# Patient Record
Sex: Male | Born: 1955
Health system: Southern US, Community
[De-identification: ages and names within clinical notes are randomized; demographics above are authoritative.]

## PROBLEM LIST (undated history)

## (undated) DIAGNOSIS — I00 Rheumatic fever without heart involvement: Secondary | ICD-10-CM

## (undated) DIAGNOSIS — I499 Cardiac arrhythmia, unspecified: Secondary | ICD-10-CM

## (undated) DIAGNOSIS — K635 Polyp of colon: Secondary | ICD-10-CM

## (undated) DIAGNOSIS — S83209A Unspecified tear of unspecified meniscus, current injury, unspecified knee, initial encounter: Secondary | ICD-10-CM

## (undated) DIAGNOSIS — R112 Nausea with vomiting, unspecified: Secondary | ICD-10-CM

## (undated) DIAGNOSIS — E119 Type 2 diabetes mellitus without complications: Secondary | ICD-10-CM

## (undated) DIAGNOSIS — K219 Gastro-esophageal reflux disease without esophagitis: Secondary | ICD-10-CM

## (undated) DIAGNOSIS — J189 Pneumonia, unspecified organism: Secondary | ICD-10-CM

## (undated) DIAGNOSIS — N200 Calculus of kidney: Secondary | ICD-10-CM

## (undated) DIAGNOSIS — Z87442 Personal history of urinary calculi: Secondary | ICD-10-CM

## (undated) DIAGNOSIS — Z9889 Other specified postprocedural states: Secondary | ICD-10-CM

## (undated) DIAGNOSIS — J939 Pneumothorax, unspecified: Secondary | ICD-10-CM

## (undated) DIAGNOSIS — M1990 Unspecified osteoarthritis, unspecified site: Secondary | ICD-10-CM

## (undated) DIAGNOSIS — R011 Cardiac murmur, unspecified: Secondary | ICD-10-CM

## (undated) HISTORY — PX: COLONOSCOPY WITH ESOPHAGOGASTRODUODENOSCOPY (EGD): SHX5779

## (undated) HISTORY — PX: LITHOTRIPSY: SUR834

## (undated) HISTORY — DX: Calculus of kidney: N20.0

## (undated) HISTORY — DX: Unspecified tear of unspecified meniscus, current injury, unspecified knee, initial encounter: S83.209A

## (undated) HISTORY — PX: COLONOSCOPY W/ POLYPECTOMY: SHX1380

## (undated) HISTORY — DX: Type 2 diabetes mellitus without complications: E11.9

## (undated) HISTORY — DX: Pneumothorax, unspecified: J93.9

## (undated) HISTORY — PX: APPENDECTOMY: SHX54

## (undated) HISTORY — DX: Polyp of colon: K63.5

---

## 2006-02-06 ENCOUNTER — Ambulatory Visit: Payer: Self-pay | Admitting: Unknown Physician Specialty

## 2011-12-19 ENCOUNTER — Ambulatory Visit: Payer: Self-pay | Admitting: Unknown Physician Specialty

## 2012-05-27 ENCOUNTER — Ambulatory Visit: Payer: Self-pay | Admitting: Family Medicine

## 2012-09-09 LAB — CK TOTAL AND CKMB (NOT AT ARMC)
CK, Total: 95 U/L (ref 35–232)
CK-MB: 1.3 ng/mL (ref 0.5–3.6)

## 2012-09-09 LAB — BASIC METABOLIC PANEL
Anion Gap: 7 (ref 7–16)
BUN: 25 mg/dL — ABNORMAL HIGH (ref 7–18)
Calcium, Total: 8.9 mg/dL (ref 8.5–10.1)
Chloride: 104 mmol/L (ref 98–107)
Creatinine: 0.92 mg/dL (ref 0.60–1.30)
Osmolality: 279 (ref 275–301)
Potassium: 3.7 mmol/L (ref 3.5–5.1)

## 2012-09-09 LAB — TROPONIN I: Troponin-I: <0.02 ng/mL

## 2012-09-09 LAB — CBC
HCT: 47.1 % (ref 40.0–52.0)
MCH: 30 pg (ref 26.0–34.0)
MCHC: 33.4 g/dL (ref 32.0–36.0)
Platelet: 179 10*3/uL (ref 150–440)
RDW: 13.6 % (ref 11.5–14.5)
WBC: 9.5 10*3/uL (ref 3.8–10.6)

## 2012-09-09 LAB — HEPATIC FUNCTION PANEL A (ARMC)
Albumin: 4.3 g/dL (ref 3.4–5.0)
SGOT(AST): 23 U/L (ref 15–37)
SGPT (ALT): 26 U/L (ref 12–78)
Total Protein: 7.6 g/dL (ref 6.4–8.2)

## 2012-09-09 LAB — LIPASE, BLOOD: Lipase: 99 U/L (ref 73–393)

## 2012-09-10 ENCOUNTER — Observation Stay: Payer: Self-pay | Admitting: Student

## 2012-09-10 LAB — CBC WITH DIFFERENTIAL/PLATELET
Basophil #: 0 10*3/uL (ref 0.0–0.1)
Eosinophil #: 0.2 10*3/uL (ref 0.0–0.7)
Eosinophil %: 3.4 %
HCT: 38.2 % — ABNORMAL LOW (ref 40.0–52.0)
Lymphocyte #: 1.1 10*3/uL (ref 1.0–3.6)
Lymphocyte %: 20.9 %
MCH: 31 pg (ref 26.0–34.0)
MCHC: 34.5 g/dL (ref 32.0–36.0)
MCV: 90 fL (ref 80–100)
Monocyte %: 9.3 %
Neutrophil #: 3.6 10*3/uL (ref 1.4–6.5)
Neutrophil %: 66.2 %
RBC: 4.25 10*6/uL — ABNORMAL LOW (ref 4.40–5.90)
RDW: 13.5 % (ref 11.5–14.5)
WBC: 5.4 10*3/uL (ref 3.8–10.6)

## 2012-09-10 LAB — TROPONIN I: Troponin-I: <0.02 ng/mL

## 2012-09-11 LAB — CBC WITH DIFFERENTIAL/PLATELET
Basophil #: 0 10*3/uL (ref 0.0–0.1)
Eosinophil #: 0.3 10*3/uL (ref 0.0–0.7)
Eosinophil %: 6.4 %
Monocyte #: 0.6 x10 3/mm (ref 0.2–1.0)
Neutrophil #: 1.4 10*3/uL (ref 1.4–6.5)
Neutrophil %: 34.5 %
Platelet: 137 10*3/uL — ABNORMAL LOW (ref 150–440)
RDW: 13.9 % (ref 11.5–14.5)
WBC: 3.9 10*3/uL (ref 3.8–10.6)

## 2012-09-11 LAB — COMPREHENSIVE METABOLIC PANEL
Albumin: 2.9 g/dL — ABNORMAL LOW (ref 3.4–5.0)
BUN: 16 mg/dL (ref 7–18)
Calcium, Total: 7.8 mg/dL — ABNORMAL LOW (ref 8.5–10.1)
Co2: 26 mmol/L (ref 21–32)
EGFR (Non-African Amer.): >60
Glucose: 126 mg/dL — ABNORMAL HIGH (ref 65–99)
Potassium: 3.6 mmol/L (ref 3.5–5.1)
SGOT(AST): 19 U/L (ref 15–37)
SGPT (ALT): 18 U/L (ref 12–78)
Total Protein: 5.9 g/dL — ABNORMAL LOW (ref 6.4–8.2)

## 2012-09-15 ENCOUNTER — Ambulatory Visit: Payer: Self-pay | Admitting: Internal Medicine

## 2012-12-07 ENCOUNTER — Emergency Department: Payer: Self-pay | Admitting: Emergency Medicine

## 2012-12-07 LAB — URINALYSIS, COMPLETE
Bilirubin,UR: NEGATIVE
Leukocyte Esterase: NEGATIVE
Nitrite: NEGATIVE
Ph: 6 (ref 4.5–8.0)
Protein: 30
RBC,UR: 231 /HPF (ref 0–5)
Specific Gravity: 1.025 (ref 1.003–1.030)
Squamous Epithelial: <1

## 2012-12-07 LAB — CBC
HCT: 41.8 % (ref 40.0–52.0)
MCH: 29.2 pg (ref 26.0–34.0)
MCHC: 32.9 g/dL (ref 32.0–36.0)
MCV: 89 fL (ref 80–100)
Platelet: 190 10*3/uL (ref 150–440)
WBC: 6 10*3/uL (ref 3.8–10.6)

## 2012-12-07 LAB — BASIC METABOLIC PANEL
Anion Gap: 9 (ref 7–16)
BUN: 21 mg/dL — ABNORMAL HIGH (ref 7–18)
Co2: 22 mmol/L (ref 21–32)
Creatinine: 1.11 mg/dL (ref 0.60–1.30)
EGFR (Non-African Amer.): >60
Osmolality: 280 (ref 275–301)
Potassium: 3.5 mmol/L (ref 3.5–5.1)

## 2013-09-23 ENCOUNTER — Ambulatory Visit: Payer: Self-pay | Admitting: General Practice

## 2014-12-28 ENCOUNTER — Ambulatory Visit: Payer: Self-pay | Admitting: Family Medicine

## 2015-02-06 NOTE — H&P (Signed)
PATIENT NAME:  James MessierREID, Domenik L MR#:  [id number] DATE OF BIRTH:  April 22, 1956  DATE OF ADMISSION:  09/10/2012  PRIMARY CARE PHYSICIAN: Dr. Vonita MossMark Crissman   REFERRING PHYSICIAN: Dr. Bayard Malesandolph Brown    CHIEF COMPLAINT: Chest pain, nausea, and vomiting.   HISTORY OF PRESENT ILLNESS: The patient is a 59 year old male with significant past medical history of diabetes mellitus and hyperlipidemia who presents with complaints of nausea, vomiting, and chest pain. The patient reports he had an episode of nausea and vomiting this afternoon, dark green color, multiple episodes where he reports up to seven, with complaints of some nausea where he stated he developed chest pain radiating to the neck area after this episode of vomiting. Upon presentation to the ED, the patient had significant pain radiating to the neck where there was concern of possible dissection. The patient had CT of the chest, abdomen, and pelvis with IV contrast which came back negative for any aortic aneurysm, dissection, or periaortic hemorrhage. As well due to significant pain in the neck, he had CTA cervical which did not show any evidence of dissection or aneurysm or significant stenosis in common carotid arteries and internal and external carotid artery. The patient denies any previous episodes of chest pain. Reports his chest pain got much better with Dilaudid. Denies any shortness of breath, palpitation, or diaphoresis accompanying this chest pain. The patient did have any episodes of vomiting since presentation to ED and reports his nausea is much improved after receiving Zofran. Denies any abdominal pain, any diarrhea, any constipation. As well, his CT of the abdomen did not show any remarkable findings in the small bowel or colon. Hospitalist service was requested to admit the patient for further management and evaluation for his chest pain and nausea and vomiting. The patient was slightly hypotensive after receiving IV Dilaudid for his pain  which improved after receiving fluid bolus.   PAST MEDICAL HISTORY:  1. Diabetes mellitus.  2. History of adenomatous polyps.  3. Diabetes mellitus.  4. Hyperlipidemia.   SOCIAL HISTORY: Previous tobacco, quit in 1995. Negative alcohol use.   FAMILY HISTORY: Significant for colon polyps in the family. Mother had a pacemaker but no family history of heart attack at young age.   ALLERGIES: No known drug allergies.   HOME MEDICATIONS:  1. Zocor 40 mg oral daily. 2. Tradjenta 5 mg oral daily. 3. Nexium 40 mg oral daily. 4. Metformin 500 mg 2 tablets daily. 5. Aspirin 81 mg daily.   REVIEW OF SYSTEMS: The patient denies any fever. Complains of generalized weakness. Denies any chills. EYES: Denies blurry vision, double vision or pain. ENT: Denies tinnitus, ear pain, hearing loss. RESPIRATORY: Denies cough, wheezing, hemoptysis, dyspnea, COPD. CARDIOVASCULAR: Has complaints of chest pain, midsternal, radiating to the neck. Denies any palpitations, syncope, arrhythmia, edema. GI: Complains of nausea and vomiting. Denies any diarrhea, abdominal pain, hematemesis, melena, gastroesophageal reflux disease. GU: Denies dysuria, hematuria, renal colic. ENDOCRINE: Denies polyuria, polydipsia, heat or cold intolerance. INTEGUMENTARY: Denies acne, rash, or skin lesions. MUSCULOSKELETAL: Denies any swelling, gout, arthritis, cramps. Complains of neck pain and chest pain. NEUROLOGIC: Denies any numbness, dysarthria, epilepsy, tremors, vertigo, ataxia. PSYCHIATRIC: Denies anxiety, insomnia, bipolar disorder, depression, or schizophrenia.   PHYSICAL EXAMINATION:   VITAL SIGNS: Temperature 99, pulse 76, respiratory rate 12, blood pressure 100/63, saturating 96% on 2 liters nasal cannula.   GENERAL: Well nourished male, looks comfortable in bed in no apparent distress.   HEENT: Head atraumatic, normocephalic. Pupils equal, reactive to light. Pink  conjunctivae. Anicteric sclerae. Moist oral mucosa.   NECK:  Supple. No thyromegaly. No JVD.   CHEST: Good air entry bilaterally. No wheezing, rales, rhonchi. Chest is tender to palpation in the midsternal area. As well, his neck is tender to palpation. Reports this is the kind of pain he presented with.   CARDIOVASCULAR: S1, S2 heard. No rubs, murmur, or gallops.   ABDOMEN: Soft, nontender, nondistended. Bowel sounds present.   EXTREMITIES: No edema. No clubbing. No cyanosis.   PSYCHIATRIC: Appropriate affect. Awake, alert x3. Intact judgment and insight.   NEUROLOGIC: Cranial nerves grossly intact. Motor 5/5 strength in all extremities.   SKIN: Warm and dry. Normal skin turgor.   PERTINENT LABS: Glucose 116. BUN 25, creatinine 0.92, sodium 137, potassium 3.7, chloride 104, CO2 26. Troponin less than 0.02. White blood cells 9.5, hemoglobin 15.7, hematocrit 47.1, platelets 179.   EKG showing normal sinus rhythm without significant ST or T wave changes with occasional PVCs.   ASSESSMENT AND PLAN: This is a 59 year old male who presents with chest pain developed after episode of vomiting. Has no EKG changes and negative troponin.  1. Chest pain. It appears to be musculoskeletal quality, is reproducible by palpation most likely provoked by his nausea and vomiting but given his risk factor as he is diabetic with hyperlipidemia and significant pain, he was given aspirin in the ED. Will cycle the patient's troponins and if negative will schedule for stress test in a.m.  2. Nausea and vomiting, appears to be controlled currently. No recurrence in the ED. This is most likely due to acute gastroenteritis, probably viral. Will continue with Zofran and fluids.  3. Diabetes mellitus. Will have patient on insulin sliding scale. Will hold his metformin as he was given IV contrast.  4. Hyperlipidemia. Will continue with statin.  5. DVT prophylaxis. Sub-Q heparin.  6. GI prophylaxis. On PPI.   CODE STATUS: FULL CODE.   TOTAL TIME SPENT ON PATIENT ADMISSION AND  CARE: 55 minutes.   ____________________________ Starleen Arms, MD dse:drc D: 09/10/2012 02:46:59 ET T: 09/10/2012 06:42:06 ET JOB#: [id number]  cc: Starleen Arms, MD, <Dictator> Steele Sizer, MD DAWOOD Teena Irani MD ELECTRONICALLY SIGNED 09/12/2012 1:06

## 2015-02-06 NOTE — Discharge Summary (Signed)
PATIENT NAME:  James MessierREID, Marie L MR#:  [id number] DATE OF BIRTH:  [birth year]  DATE OF ADMISSION:  09/10/2012 DATE OF DISCHARGE:  09/11/2012  CHIEF COMPLAINT: Chest pain, nausea and vomiting.   PRIMARY CARE PHYSICIAN:  Dr. Vonita MossMark Crissman.   DISCHARGE DIAGNOSES:  1. Chest pain, possibly musculoskeletal and gastrointestinal related.  2. Acute gastritis which is resolved.  3. Hyperlipidemia.  4. Diabetes.  5. History of adenomatous polyps.   DISCHARGE MEDICATIONS:  1. Zocor 40 mg daily.  2. Aspirin 81 mg daily.  3. Metformin 500 mg 2 tabs in the morning and 1 tab at night. 4. Nexium 40 mg qd.  5. Tradjenta 5 mg daily   ACTIVITY: As tolerated.   DIET: Low sodium, consistent carb diet for diabetes.   FOLLOWUP: Please follow with your primary care physician, gastroenterologist and cardiologist within 1 to 2 weeks.   DISPOSITION: Home.   HISTORY OF PRESENT ILLNESS/HOSPITAL COURSE: For full details of the history and physical, please see the dictation on 11/22 by Dr. Randol KernElgergawy, but briefly this is a 59 year old Caucasian male with history of diabetes and hyperlipidemia who presents with complaints of chest pain after having seven episodes of nausea and vomiting. The vomitus was dark green in color multiple episodes, and then the patient developed some chest pain radiating to the neck where the patient underwent CT of the chest, abdomen and pelvis with IV contrast which shows negative. The patient also had a CT angiogram of his neck which was also negative and was admitted to the hospitalist service for further evaluation and management. The patient was ruled out for acute coronary syndrome with cyclic cardiac markers. The patient had initial creatinine of 0.92, sodium 137. LFTs showed direct bilirubin of 0.3, otherwise within normal limits on arrival. He had no fever or leukocytosis. CT of neck angiogram was done which did not show any acute arterial dissection. He was started on aspirin, statin,  and IV PPI. His gastritis resolved. He underwent a nuclear medicine stress test which did not show any clear evidence for stress-induced ischemia. However, it was a less than adequate scan and there was a persistent inferior defect of unclear etiology which could be coronary artery disease but also artificial and per stress test dictation, the patient did not have any further symptoms, clinically okay. The recommendation was to treat medically. He has had no further episodes of chest pain, nausea and vomiting today and he will be discharged with outpatient follow-up. He, of note, has been recently started with a PPI and is to have an esophagogastroduodenoscopy as an outpatient which we strongly recommend following up. Furthermore, I went over the results of the stress test with the patient and discussed further follow-up with a cardiologist which she is agreeable to.   DISPOSITION: Home.   CODE STATUS: FULL CODE.   TOTAL TIME SPENT: 35 minutes.    ____________________________ Krystal EatonShayiq Mutasim Tuckey, MD sa:ap D: 09/11/2012 12:35:10 ET T: 09/12/2012 14:37:08 ET JOB#: [id number]  cc: Krystal EatonShayiq Araeya Lamb, MD, <Dictator> Steele SizerMark A. Crissman, MD Krystal EatonSHAYIQ Carlette Palmatier MD ELECTRONICALLY SIGNED 09/30/2012 11:05

## 2015-04-10 ENCOUNTER — Other Ambulatory Visit: Payer: Self-pay

## 2015-07-10 ENCOUNTER — Other Ambulatory Visit: Payer: Self-pay | Admitting: Family Medicine

## 2015-07-22 ENCOUNTER — Other Ambulatory Visit: Payer: Self-pay | Admitting: Family Medicine

## 2015-08-01 ENCOUNTER — Encounter: Payer: Self-pay | Admitting: Family Medicine

## 2015-08-01 ENCOUNTER — Ambulatory Visit (INDEPENDENT_AMBULATORY_CARE_PROVIDER_SITE_OTHER): Payer: Managed Care, Other (non HMO) | Admitting: Family Medicine

## 2015-08-01 VITALS — BP 97/58 | HR 56 | Temp 97.6°F | Ht 69.8 in | Wt 169.0 lb

## 2015-08-01 DIAGNOSIS — E119 Type 2 diabetes mellitus without complications: Secondary | ICD-10-CM | POA: Diagnosis not present

## 2015-08-01 DIAGNOSIS — E785 Hyperlipidemia, unspecified: Secondary | ICD-10-CM

## 2015-08-01 DIAGNOSIS — E1169 Type 2 diabetes mellitus with other specified complication: Secondary | ICD-10-CM | POA: Insufficient documentation

## 2015-08-01 LAB — LP+ALT+AST PICCOLO, WAIVED
ALT (SGPT) Piccolo, Waived: 20 U/L (ref 10–47)
AST (SGOT) Piccolo, Waived: 22 U/L (ref 11–38)
CHOLESTEROL PICCOLO, WAIVED: 164 mg/dL (ref <200)
Chol/HDL Ratio Piccolo,Waive: 3.7 mg/dL
HDL CHOL PICCOLO, WAIVED: 44 mg/dL — AB (ref >59)
LDL Chol Calc Piccolo Waived: 97 mg/dL (ref <100)
Triglycerides Piccolo,Waived: 116 mg/dL (ref <150)
VLDL Chol Calc Piccolo,Waive: 23 mg/dL (ref <30)

## 2015-08-01 LAB — BAYER DCA HB A1C WAIVED: HB A1C: 7.3 % — AB (ref <7.0)

## 2015-08-01 LAB — MICROALBUMIN, URINE WAIVED
Creatinine, Urine Waived: 100 mg/dL (ref 10–300)
Microalb, Ur Waived: 10 mg/L (ref 0–19)

## 2015-08-01 MED ORDER — ESOMEPRAZOLE MAGNESIUM 40 MG PO PACK: 40.0000 mg | PACK | Freq: Every day | ORAL | Status: DC

## 2015-08-01 MED ORDER — NAPROXEN 500 MG PO TABS: 500.0000 mg | ORAL_TABLET | Freq: Two times a day (BID) | ORAL | Status: DC | PRN

## 2015-08-01 MED ORDER — SITAGLIPTIN PHOSPHATE 100 MG PO TABS: 100.0000 mg | ORAL_TABLET | Freq: Every day | ORAL | Status: DC

## 2015-08-01 MED ORDER — METFORMIN HCL 500 MG PO TABS: 1000.0000 mg | ORAL_TABLET | Freq: Two times a day (BID) | ORAL | Status: DC

## 2015-08-01 MED ORDER — CLONAZEPAM 1 MG PO TABS: 1.0000 mg | ORAL_TABLET | Freq: Every evening | ORAL | Status: DC | PRN

## 2015-08-01 MED ORDER — ATORVASTATIN CALCIUM 40 MG PO TABS: 40.0000 mg | ORAL_TABLET | Freq: Every day | ORAL | Status: DC

## 2015-08-01 MED ORDER — DAPAGLIFLOZIN PROPANEDIOL 10 MG PO TABS: 10.0000 mg | ORAL_TABLET | Freq: Every day | ORAL | Status: DC

## 2015-08-01 NOTE — Assessment & Plan Note (Signed)
Diabetes poor control with hemoglobin A1c of 7.3 patient will take full dose Januvia continue exercise as tolerated by his knee and good diet.

## 2015-08-01 NOTE — Progress Notes (Signed)
BP 97/58 mmHg  Pulse 56  Temp(Src) 97.6 F (36.4 C)  Ht 5' 9.8" (1.773 m)  Wt 169 lb (76.658 kg)  BMI 24.39 kg/m2  SpO2 98%   Subjective:    Patient ID: [name], male    DOB: 09/02/1956, 59 y.o.   MRN: [id number]  HPI: James MessierMichael L Gasiorowski is a 59 y.o. male  Chief Complaint  Patient presents with  . Diabetes  . Hyperlipidemia   patient follow-up diabetes all in all doing well fasting blood sugars in the 120s 130s takes one metformin at night 2 in the morning. Taking half of Januvia each morning. Afternoon blood sugars are high though. Patient taking simvastatin 40 mg no side effects Takes clonazepam half a tablet 2-3 times a week. Helps with sleep and shortness mind down for rest. Takes Naprosyn for knee has a torn meniscus. Can have surgery in December.  Relevant past medical, surgical, family and social history reviewed and updated as indicated. Interim medical history since our last visit reviewed. Allergies and medications reviewed and updated.  Review of Systems  Constitutional: Negative.   Respiratory: Negative.   Cardiovascular: Negative.     Per HPI unless specifically indicated above     Objective:    BP 97/58 mmHg  Pulse 56  Temp(Src) 97.6 F (36.4 C)  Ht 5' 9.8" (1.773 m)  Wt 169 lb (76.658 kg)  BMI 24.39 kg/m2  SpO2 98%  Wt Readings from Last 3 Encounters:  08/01/15 169 lb (76.658 kg)  02/19/15 167 lb (75.751 kg)    Physical Exam  Constitutional: He is oriented to person, place, and time. He appears well-developed and well-nourished. No distress.  HENT:  Head: Normocephalic and atraumatic.  Right Ear: Hearing normal.  Left Ear: Hearing normal.  Nose: Nose normal.  Eyes: Conjunctivae and lids are normal. Right eye exhibits no discharge. Left eye exhibits no discharge. No scleral icterus.  Cardiovascular: Normal rate, regular rhythm and normal heart sounds.   Pulmonary/Chest: Effort normal and breath sounds normal. No respiratory distress.   Musculoskeletal: Normal range of motion.  Neurological: He is alert and oriented to person, place, and time.  Skin: Skin is intact. No rash noted.  Psychiatric: He has a normal mood and affect. His speech is normal and behavior is normal. Judgment and thought content normal. Cognition and memory are normal.    2    Assessment & Plan:   Problem List Items Addressed This Visit      Endocrine   Diabetes mellitus without complication (HCC) - Primary    Diabetes poor control with hemoglobin A1c of 7.3 patient will take full dose Januvia continue exercise as tolerated by his knee and good diet.      Relevant Medications   dapagliflozin propanediol (FARXIGA) 10 MG TABS tablet   metFORMIN (GLUCOPHAGE) 500 MG tablet   sitaGLIPtin (JANUVIA) 100 MG tablet   atorvastatin (LIPITOR) 40 MG tablet   Other Relevant Orders   LP+ALT+AST Piccolo, Waived   Bayer DCA Hb A1c Waived   Microalbumin, Urine Waived   Basic metabolic panel     Other   Hyperlipemia    Discuss in adequate control with LDL of 97 Will change simvastatin to atorvastatin 40      Relevant Medications   atorvastatin (LIPITOR) 40 MG tablet   Other Relevant Orders   LP+ALT+AST Piccolo, Waived   Bayer DCA Hb A1c Waived   Microalbumin, Urine Waived   Basic metabolic panel  Follow up plan: Return in about 3 months (around 11/01/2015), or if symptoms worsen or fail to improve, for Follow-up medical issues with BMP, lipid panel, ALT, AST, hemoglobin A1c.

## 2015-08-01 NOTE — Assessment & Plan Note (Signed)
Discuss in adequate control with LDL of 97 Will change simvastatin to atorvastatin 40

## 2015-08-02 ENCOUNTER — Encounter: Payer: Self-pay | Admitting: Family Medicine

## 2015-08-02 LAB — BASIC METABOLIC PANEL
BUN / CREAT RATIO: 25 — AB (ref 10–22)
BUN: 25 mg/dL (ref 8–27)
CALCIUM: 9.4 mg/dL (ref 8.6–10.2)
CHLORIDE: 100 mmol/L (ref 97–108)
CO2: 24 mmol/L (ref 18–29)
Creatinine, Ser: 1 mg/dL (ref 0.76–1.27)
GFR calc non Af Amer: 81 mL/min/{1.73_m2} (ref >59)
GFR, EST AFRICAN AMERICAN: 93 mL/min/{1.73_m2} (ref >59)
Glucose: 131 mg/dL — ABNORMAL HIGH (ref 65–99)
POTASSIUM: 4.1 mmol/L (ref 3.5–5.2)
SODIUM: 140 mmol/L (ref 134–144)

## 2015-09-10 ENCOUNTER — Inpatient Hospital Stay: Admission: RE | Admit: 2015-09-10 | Payer: Self-pay | Source: Ambulatory Visit

## 2015-09-18 ENCOUNTER — Encounter
Admission: RE | Admit: 2015-09-18 | Discharge: 2015-09-18 | Disposition: A | Discharge status: Home / Self Care | Payer: Managed Care, Other (non HMO) | Source: Ambulatory Visit | Attending: Orthopedic Surgery | Admitting: Orthopedic Surgery

## 2015-09-18 DIAGNOSIS — Z01818 Encounter for other preprocedural examination: Secondary | ICD-10-CM | POA: Diagnosis present

## 2015-09-18 DIAGNOSIS — Z01812 Encounter for preprocedural laboratory examination: Secondary | ICD-10-CM | POA: Insufficient documentation

## 2015-09-18 DIAGNOSIS — I499 Cardiac arrhythmia, unspecified: Secondary | ICD-10-CM | POA: Diagnosis not present

## 2015-09-18 HISTORY — DX: Rheumatic fever without heart involvement: I00

## 2015-09-18 HISTORY — DX: Cardiac arrhythmia, unspecified: I49.9

## 2015-09-18 LAB — BASIC METABOLIC PANEL
Anion gap: 6 (ref 5–15)
BUN: 18 mg/dL (ref 6–20)
CALCIUM: 9.3 mg/dL (ref 8.9–10.3)
CO2: 26 mmol/L (ref 22–32)
CREATININE: 0.87 mg/dL (ref 0.61–1.24)
Chloride: 107 mmol/L (ref 101–111)
Glucose, Bld: 298 mg/dL — ABNORMAL HIGH (ref 65–99)
Potassium: 4.3 mmol/L (ref 3.5–5.1)
Sodium: 139 mmol/L (ref 135–145)

## 2015-09-18 NOTE — Patient Instructions (Signed)
  Your procedure is scheduled on: 09/24/15 Mon  Report to Day Surgery.2nd floor medical mall To find out your arrival time please call [phone] between 1PM - 3PM on 09/21/15 Fri.  Remember: Instructions that are not followed completely may result in serious medical risk, up to and including death, or upon the discretion of your surgeon and anesthesiologist your surgery may need to be rescheduled.    __x__ 1. Do not eat food or drink liquids after midnight. No gum chewing or hard candies.     ____ 2. No Alcohol for 24 hours before or after surgery.   ____ 3. Bring all medications with you on the day of surgery if instructed.    __x__ 4. Notify your doctor if there is any change in your medical condition     (cold, fever, infections).     Do not wear jewelry, make-up, hairpins, clips or nail polish.  Do not wear lotions, powders, or perfumes. You may wear deodorant.  Do not shave 48 hours prior to surgery. Men may shave face and neck.  Do not bring valuables to the hospital.    [facility] any belongings or valuables.               Contacts, dentures or bridgework may not be worn into surgery.  Leave your suitcase in the car. After surgery it may be brought to your room.  For patients admitted to the hospital, discharge time is determined by your                treatment team.   Patients discharged the day of surgery will not be allowed to drive home.   Please read over the following fact sheets that you were given:      __x__ Take these medicines the morning of surgery with A SIP OF WATER:    1. esomeprazole (NEXIUM) 40 MG packet  2.   3.   4.  5.  6.  ____ Fleet Enema (as directed)   _x___ Use CHG Soap as directed  ____ Use inhalers on the day of surgery  __x__ Stop metformin 2 days prior to surgery    ____ Take 1/2 of usual insulin dose the night before surgery and none on the morning of surgery.   x__ Stop Coumadin/Plavix/aspirin on stop  aspirin today  _x___ Stop Anti-inflammatories on stop naproxen (NAPROSYN) 250 MG tablet today  ____ Stop supplements until after surgery.    ____ Bring C-Pap to the hospital.

## 2015-09-20 NOTE — Pre-Procedure Instructions (Signed)
Dr Maisie Fushomas notified regarding EKG interpretation and comparison to 2013 EKG.  OK to proceed per Dr Maisie Fushomas.

## 2015-09-24 ENCOUNTER — Ambulatory Visit
Admission: RE | Admit: 2015-09-24 | Discharge: 2015-09-24 | Disposition: A | Discharge status: Home / Self Care | Payer: Managed Care, Other (non HMO) | Source: Ambulatory Visit | Attending: Orthopedic Surgery | Admitting: Orthopedic Surgery

## 2015-09-24 ENCOUNTER — Ambulatory Visit: Payer: Managed Care, Other (non HMO) | Admitting: Anesthesiology

## 2015-09-24 ENCOUNTER — Encounter: Payer: Self-pay | Admitting: *Deleted

## 2015-09-24 ENCOUNTER — Encounter
Admission: RE | Disposition: A | Discharge status: Home / Self Care | Payer: Self-pay | Source: Ambulatory Visit | Attending: Orthopedic Surgery

## 2015-09-24 DIAGNOSIS — M238X2 Other internal derangements of left knee: Secondary | ICD-10-CM | POA: Diagnosis present

## 2015-09-24 DIAGNOSIS — E119 Type 2 diabetes mellitus without complications: Secondary | ICD-10-CM | POA: Insufficient documentation

## 2015-09-24 DIAGNOSIS — E7800 Pure hypercholesterolemia, unspecified: Secondary | ICD-10-CM | POA: Insufficient documentation

## 2015-09-24 DIAGNOSIS — Z79899 Other long term (current) drug therapy: Secondary | ICD-10-CM | POA: Diagnosis not present

## 2015-09-24 DIAGNOSIS — M23222 Derangement of posterior horn of medial meniscus due to old tear or injury, left knee: Secondary | ICD-10-CM | POA: Diagnosis not present

## 2015-09-24 DIAGNOSIS — M94262 Chondromalacia, left knee: Secondary | ICD-10-CM | POA: Insufficient documentation

## 2015-09-24 DIAGNOSIS — Z833 Family history of diabetes mellitus: Secondary | ICD-10-CM | POA: Insufficient documentation

## 2015-09-24 DIAGNOSIS — Z7984 Long term (current) use of oral hypoglycemic drugs: Secondary | ICD-10-CM | POA: Insufficient documentation

## 2015-09-24 DIAGNOSIS — I10 Essential (primary) hypertension: Secondary | ICD-10-CM | POA: Insufficient documentation

## 2015-09-24 DIAGNOSIS — K219 Gastro-esophageal reflux disease without esophagitis: Secondary | ICD-10-CM | POA: Insufficient documentation

## 2015-09-24 DIAGNOSIS — Z8249 Family history of ischemic heart disease and other diseases of the circulatory system: Secondary | ICD-10-CM | POA: Diagnosis not present

## 2015-09-24 DIAGNOSIS — Z7982 Long term (current) use of aspirin: Secondary | ICD-10-CM | POA: Insufficient documentation

## 2015-09-24 HISTORY — PX: KNEE ARTHROSCOPY WITH MEDIAL MENISECTOMY: SHX5651

## 2015-09-24 LAB — GLUCOSE, CAPILLARY
GLUCOSE-CAPILLARY: 158 mg/dL — AB (ref 65–99)
Glucose-Capillary: 164 mg/dL — ABNORMAL HIGH (ref 65–99)

## 2015-09-24 SURGERY — ARTHROSCOPY, KNEE, WITH MEDIAL MENISCECTOMY
Anesthesia: General | Site: Knee | Laterality: Left

## 2015-09-24 MED ORDER — ONDANSETRON 4 MG PO TBDP: 4.0000 mg | ORAL_TABLET | Freq: Three times a day (TID) | ORAL | Status: DC | PRN

## 2015-09-24 MED ORDER — HYDROCODONE-ACETAMINOPHEN 5-325 MG PO TABS
ORAL_TABLET | ORAL | Status: AC
  Filled 2015-09-24: qty 1

## 2015-09-24 MED ORDER — BUPIVACAINE-EPINEPHRINE (PF) 0.25% -1:200000 IJ SOLN
INTRAMUSCULAR | Status: AC
  Filled 2015-09-24: qty 30

## 2015-09-24 MED ORDER — HYDROCODONE-ACETAMINOPHEN 5-325 MG PO TABS
1.0000 | ORAL_TABLET | ORAL | Status: DC | PRN
  Administered 2015-09-24: 1 via ORAL

## 2015-09-24 MED ORDER — METOCLOPRAMIDE HCL 5 MG/ML IJ SOLN: 5.0000 mg | Freq: Three times a day (TID) | INTRAMUSCULAR | Status: DC | PRN

## 2015-09-24 MED ORDER — ACETAMINOPHEN 10 MG/ML IV SOLN
INTRAVENOUS | Status: DC | PRN
  Administered 2015-09-24: 1000 mg via INTRAVENOUS

## 2015-09-24 MED ORDER — ONDANSETRON HCL 4 MG/2ML IJ SOLN
4.0000 mg | Freq: Once | INTRAMUSCULAR | Status: AC | PRN
  Administered 2015-09-24: 4 mg via INTRAVENOUS

## 2015-09-24 MED ORDER — EPHEDRINE SULFATE 50 MG/ML IJ SOLN
INTRAMUSCULAR | Status: DC | PRN
  Administered 2015-09-24: 5 mg via INTRAVENOUS

## 2015-09-24 MED ORDER — MORPHINE SULFATE (PF) 4 MG/ML IV SOLN
INTRAVENOUS | Status: AC
  Filled 2015-09-24: qty 1

## 2015-09-24 MED ORDER — LIDOCAINE HCL (CARDIAC) 20 MG/ML IV SOLN
INTRAVENOUS | Status: DC | PRN
  Administered 2015-09-24: 100 mg via INTRAVENOUS

## 2015-09-24 MED ORDER — FENTANYL CITRATE (PF) 100 MCG/2ML IJ SOLN
INTRAMUSCULAR | Status: AC
  Filled 2015-09-24: qty 2

## 2015-09-24 MED ORDER — ACETAMINOPHEN 10 MG/ML IV SOLN
INTRAVENOUS | Status: AC
  Filled 2015-09-24: qty 100

## 2015-09-24 MED ORDER — ONDANSETRON HCL 4 MG/2ML IJ SOLN: 4.0000 mg | Freq: Four times a day (QID) | INTRAMUSCULAR | Status: DC | PRN

## 2015-09-24 MED ORDER — PROPOFOL 10 MG/ML IV BOLUS
INTRAVENOUS | Status: DC | PRN
  Administered 2015-09-24: 50 mg via INTRAVENOUS
  Administered 2015-09-24: 150 mg via INTRAVENOUS

## 2015-09-24 MED ORDER — FENTANYL CITRATE (PF) 100 MCG/2ML IJ SOLN
25.0000 ug | INTRAMUSCULAR | Status: DC | PRN
  Administered 2015-09-24 (×3): 25 ug via INTRAVENOUS

## 2015-09-24 MED ORDER — ONDANSETRON HCL 4 MG/2ML IJ SOLN
INTRAMUSCULAR | Status: DC | PRN
  Administered 2015-09-24: 4 mg via INTRAVENOUS

## 2015-09-24 MED ORDER — HYDROCODONE-ACETAMINOPHEN 5-325 MG PO TABS: 1.0000 | ORAL_TABLET | ORAL | Status: DC | PRN

## 2015-09-24 MED ORDER — DEXAMETHASONE SODIUM PHOSPHATE 4 MG/ML IJ SOLN
INTRAMUSCULAR | Status: DC | PRN
  Administered 2015-09-24: 5 mg via INTRAVENOUS

## 2015-09-24 MED ORDER — ONDANSETRON HCL 4 MG PO TABS: 4.0000 mg | ORAL_TABLET | Freq: Four times a day (QID) | ORAL | Status: DC | PRN

## 2015-09-24 MED ORDER — METOCLOPRAMIDE HCL 10 MG PO TABS: 5.0000 mg | ORAL_TABLET | Freq: Three times a day (TID) | ORAL | Status: DC | PRN

## 2015-09-24 MED ORDER — FENTANYL CITRATE (PF) 100 MCG/2ML IJ SOLN
INTRAMUSCULAR | Status: DC | PRN
  Administered 2015-09-24 (×4): 25 ug via INTRAVENOUS

## 2015-09-24 MED ORDER — MORPHINE SULFATE 4 MG/ML IJ SOLN
INTRAMUSCULAR | Status: DC | PRN
  Administered 2015-09-24: 31 mL via INTRA_ARTICULAR

## 2015-09-24 MED ORDER — SODIUM CHLORIDE 0.9 % IV SOLN
INTRAVENOUS | Status: DC
  Administered 2015-09-24: 15:00:00 via INTRAVENOUS

## 2015-09-24 MED ORDER — ONDANSETRON HCL 4 MG/2ML IJ SOLN
INTRAMUSCULAR | Status: AC
  Filled 2015-09-24: qty 2

## 2015-09-24 SURGICAL SUPPLY — 23 items
BLADE SHAVER 4.5 DBL SERAT CV (CUTTER) ×3 IMPLANT
BNDG ESMARK 6X12 TAN STRL LF (GAUZE/BANDAGES/DRESSINGS) ×3 IMPLANT
DRSG DERMACEA 8X12 NADH (GAUZE/BANDAGES/DRESSINGS) ×3 IMPLANT
DURAPREP 26ML APPLICATOR (WOUND CARE) ×6 IMPLANT
GAUZE SPONGE 4X4 12PLY STRL (GAUZE/BANDAGES/DRESSINGS) ×3 IMPLANT
GLOVE BIOGEL M STRL SZ7.5 (GLOVE) ×3 IMPLANT
GLOVE INDICATOR 8.0 STRL GRN (GLOVE) ×3 IMPLANT
GOWN STRL REUS W/ TWL LRG LVL3 (GOWN DISPOSABLE) ×1 IMPLANT
GOWN STRL REUS W/ TWL LRG LVL4 (GOWN DISPOSABLE) ×1 IMPLANT
GOWN STRL REUS W/TWL LRG LVL3 (GOWN DISPOSABLE) ×2
GOWN STRL REUS W/TWL LRG LVL4 (GOWN DISPOSABLE) ×2
IV LACTATED RINGER IRRG 3000ML (IV SOLUTION) ×12
IV LR IRRIG 3000ML ARTHROMATIC (IV SOLUTION) ×6 IMPLANT
MANIFOLD NEPTUNE II (INSTRUMENTS) ×3 IMPLANT
PACK ARTHROSCOPY KNEE (MISCELLANEOUS) ×3 IMPLANT
SET TUBE SUCT SHAVER OUTFL 24K (TUBING) ×3 IMPLANT
SET TUBE TIP INTRA-ARTICULAR (MISCELLANEOUS) ×3 IMPLANT
STRAP SAFETY BODY (MISCELLANEOUS) ×3 IMPLANT
SUT ETHILON 3-0 FS-10 30 BLK (SUTURE) ×3
SUTURE EHLN 3-0 FS-10 30 BLK (SUTURE) ×1 IMPLANT
TUBING ARTHRO INFLOW-ONLY STRL (TUBING) ×3 IMPLANT
WAND HAND CNTRL MULTIVAC 50 (MISCELLANEOUS) ×3 IMPLANT
WRAP KNEE W/COLD PACKS 25.5X14 (SOFTGOODS) ×3 IMPLANT

## 2015-09-24 NOTE — Brief Op Note (Signed)
09/24/2015  5:31 PM  PATIENT:  [name]  59 y.o. 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   Delay start of Pharmacological VTE agent (>24hrs) due to surgical blood loss or risk of bleeding: not applicable

## 2015-09-24 NOTE — H&P (Signed)
The patient has been re-examined, and the chart reviewed, and there have been no interval changes to the documented history and physical.    The risks, benefits, and alternatives have been discussed at length. The patient expressed understanding of the risks benefits and agreed with plans for surgical intervention.  [place] Sylvan P. Dilia Alemany, Jr. M.D.    

## 2015-09-24 NOTE — Anesthesia Procedure Notes (Signed)
Procedure Name: LMA Insertion Date/Time: 09/24/2015 3:58 PM Performed by: Peyton NajjarSIMMONS, James Eugene Pre-anesthesia Checklist: Patient identified, Patient being monitored, Timeout performed, Emergency Drugs available and Suction available Patient Re-evaluated:Patient Re-evaluated prior to inductionOxygen Delivery Method: Circle system utilized Preoxygenation: Pre-oxygenation with 100% oxygen Intubation Type: IV induction Ventilation: Mask ventilation without difficulty LMA: LMA inserted LMA Size: 4.0 Tube type: Oral Number of attempts: 1 Placement Confirmation: positive ETCO2 and breath sounds checked- equal and bilateral Tube secured with: Tape Dental Injury: Teeth and Oropharynx as per pre-operative assessment

## 2015-09-24 NOTE — Transfer of Care (Signed)
Immediate Anesthesia Transfer of Care Note  Patient: Roselind MessierMichael L Brandau  Procedure(s) Performed: Procedure(s): KNEE ARTHROSCOPY WITH partial MEDIAL MENISECTOMY, chondroplasty (Left)  Patient Location: PACU  Anesthesia Type:General  Level of Consciousness: patient cooperative and lethargic  Airway & Oxygen Therapy: Patient Spontanous Breathing and Patient connected to face mask oxygen  Post-op Assessment: Report given to RN and Post -op Vital signs reviewed and stable  Post vital signs: Reviewed and stable  Last Vitals:  Filed Vitals:   09/24/15 1414 09/24/15 1729  BP: 126/73 132/80  Pulse: 70 75  Temp: 36.6 C 35.9 C  Resp: 16 16    Complications: No apparent anesthesia complications

## 2015-09-24 NOTE — Op Note (Signed)
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   ASSISTANT: none  ANESTHESIA: general  ESTIMATED BLOOD LOSS: Minimal  FLUIDS REPLACED: 900 mL of crystalloid  TOURNIQUET TIME: Not used   DRAINS: none  IMPLANTS UTILIZED: None  INDICATIONS FOR SURGERY: [name] is a 59 y.o. year old male who has been seen for complaints of left knee pain. MRI demonstrated findings consistent with meniscal pathology. After discussion of the risks and benefits of surgical intervention, the patient expressed understanding of the risks benefits and agree with plans for left knee arthroscopy.   PROCEDURE IN DETAIL: The patient was brought into the operating room and, after adequate general anesthesia was achieved, a tourniquet was applied to the left thigh and the leg was placed in the leg holder. All bony prominences were well padded. The patient's left knee was cleaned and prepped with alcohol and Duraprep and draped in the usual sterile fashion. A "timeout" was performed as per usual protocol. The anticipated portal sites were injected with 0.25% Marcaine with epinephrine. An anterolateral incision was made and a cannula was inserted. A small effusion was evacuated and the knee was distended with fluid using the pump. The scope was advanced down the medial gutter into the medial compartment. Under visualization with the scope, an anteromedial portal was created and a hooked probe was inserted. The medial meniscus was visualized and probed.  There was a degenerative tear of the posterior horn of the medial meniscus. The tear was debrided using a combination of meniscal punches and the 4.5 mm incisor shaver. Final contouring was performed using the 50 ArthroCare wand. The meniscus was then probed and felt to be stable. The anterior horn of the medial meniscus was intact. The articular cartilage was visualized. There was fibrillation involving the articular surface of the medial femoral condyle consistent with grade 3 chondromalacia. These areas were debrided using the 50 ArthroCare wand.  The scope was then advanced into the intercondylar notch. The anterior cruciate ligament was visualized and probed and felt to be intact. The scope was removed from the lateral portal and reinserted via the anteromedial portal to better visualize the lateral compartment. The lateral meniscus was visualized and probed. The lateral meniscus was intact without evidence of instability or tear. The articular cartilage of the lateral compartment was visualized. The articular surface of the lateral compartment was in good condition. Finally, the scope was advanced so as to visualize the patellofemoral articulation. Good patellar tracking was appreciated. There were grade 3 changes of chondromalacia involving the articular surface of the patella as well as the sulcus. These areas were debrided using the 50 ArthroCare wand.  The knee was irrigated with copius amounts of fluid and suctioned dry. The anterolateral portal was re-approximated with #3-0 nylon. A combination of 0.25% Marcaine with epinephrine and 4 mg of Morphine were injected via the scope. The scope was removed and the anteromedial portal was re-approximated with #3-0 nylon. A sterile dressing was applied followed by application of an ice wrap.  The patient tolerated the procedure  well and was transported to the PACU in stable condition.  [name], Jr., M.D.

## 2015-09-24 NOTE — Anesthesia Preprocedure Evaluation (Signed)
Anesthesia Evaluation  Patient identified by MRN, date of birth, ID band Patient awake    Reviewed: Allergy & Precautions, H&P , NPO status , Patient's Chart, lab work & pertinent test results, reviewed documented beta blocker date and time   Airway Mallampati: II  TM Distance: >3 FB Neck ROM: full    Dental  (+) Teeth Intact   Pulmonary neg pulmonary ROS, former smoker,    Pulmonary exam normal        Cardiovascular Exercise Tolerance: Good negative cardio ROS Normal cardiovascular exam Rate:Normal     Neuro/Psych negative neurological ROS  negative psych ROS   GI/Hepatic negative GI ROS, Neg liver ROS,   Endo/Other  negative endocrine ROSdiabetes  Renal/GU Renal diseasenegative Renal ROS  negative genitourinary   Musculoskeletal negative musculoskeletal ROS (+)   Abdominal   Peds negative pediatric ROS (+)  Hematology negative hematology ROS (+)   Anesthesia Other Findings   Reproductive/Obstetrics negative OB ROS                             Anesthesia Physical Anesthesia Plan  ASA: III  Anesthesia Plan: General LMA   Post-op Pain Management:    Induction:   Airway Management Planned:   Additional Equipment:   Intra-op Plan:   Post-operative Plan:   Informed Consent: I have reviewed the patients History and Physical, chart, labs and discussed the procedure including the risks, benefits and alternatives for the proposed anesthesia with the patient or authorized representative who has indicated his/her understanding and acceptance.     Plan Discussed with: CRNA  Anesthesia Plan Comments:         Anesthesia Quick Evaluation

## 2015-09-24 NOTE — OR Nursing (Signed)
Patient has ace wrap and ice wrap around left knee.  No drainage noted.  Pulses intact.  Toes with good circulation. Able to bend knee slightly.  Reviewed mild, gentle exercises with patient and wife.  Leg elevated on two pillows in recliner.

## 2015-09-24 NOTE — Anesthesia Postprocedure Evaluation (Signed)
Anesthesia Post Note  Patient: [name]  Procedure(s) Performed: Procedure(s) (LRB): KNEE ARTHROSCOPY WITH partial MEDIAL MENISECTOMY, chondroplasty (Left)  Patient location during evaluation: PACU Anesthesia Type: General Level of consciousness: awake and alert Pain management: pain level controlled Vital Signs Assessment: post-procedure vital signs reviewed and stable Respiratory status: spontaneous breathing and respiratory function stable Cardiovascular status: blood pressure returned to baseline and stable Anesthetic complications: no    Last Vitals:  Filed Vitals:   09/24/15 1759 09/24/15 1815  BP: 139/79 133/62  Pulse: 68 64  Temp: 36.7 C 36 C  Resp: 10 16    Last Pain:  Filed Vitals:   09/24/15 1817  PainSc: 6                  KEPHART,WILLIAM K

## 2015-09-24 NOTE — Discharge Instructions (Signed)
°  Instructions after Knee Arthroscopy  ° °- James P. Hooten, Jr., M.D.    ° Dept. of Orthopaedics & Sports Medicine ° [facility] ° [address] ° [place], [place]  [address] ° ° Phone: [phone]   Fax: [phone] ° ° °DIET: °• Drink plenty of non-alcoholic fluids & begin a light diet. °• Resume your normal diet the day after surgery. ° °ACTIVITY:  °• You may use crutches or a walker with weight-bearing as tolerated, unless instructed otherwise. °• You may wean yourself off of the walker or crutches as tolerated.  °• Begin doing gentle exercises. Exercising will reduce the pain and swelling, increase motion, and prevent muscle weakness.   °• Avoid strenuous activities or athletics for a minimum of 4-6 weeks after arthroscopic surgery. °• Do not drive or operate any equipment until instructed. ° °WOUND CARE:  °• Place one to two pillows under the knee the first day or two when sitting or lying.  °• Continue to use the ice packs periodically to reduce pain and swelling. °• The small incisions in your knee are closed with nylon stitches. The stitches will be removed in the office. °• The bulky dressing may be removed on the second day after surgery. DO NOT TOUCH THE STITCHES. Put a Band-Aid over each stitch. Do NOT use any ointments or creams on the incisions.  °• You may bathe or shower after the stitches are removed at the first office visit following surgery. ° °MEDICATIONS: °• You may resume your regular medications. °• Please take the pain medication as prescribed. °• Do not take pain medication on an empty stomach. °• Do not drive or drink alcoholic beverages when taking pain medications. ° °CALL THE OFFICE FOR: °• Temperature above 101 degrees °• Excessive bleeding or drainage on the dressing. °• Excessive swelling, coldness, or paleness of the toes. °• Persistent nausea and vomiting. ° °FOLLOW-UP:  °• You should have an appointment to return to the office in 7-10 days after surgery.   ° ° °AMBULATORY SURGERY  °DISCHARGE INSTRUCTIONS ° ° °1) The drugs that you were given will stay in your system until tomorrow so for the next 24 hours you should not: ° °A) Drive an automobile °B) Make any legal decisions °C) Drink any alcoholic beverage ° ° °2) You may resume regular meals tomorrow.  Today it is better to start with liquids and gradually work up to solid foods. ° °You may eat anything you prefer, but it is better to start with liquids, then soup and crackers, and gradually work up to solid foods. ° ° °3) Please notify your doctor immediately if you have any unusual bleeding, trouble breathing, redness and pain at the surgery site, drainage, fever, or pain not relieved by medication. ° ° ° °4) Additional Instructions: ° ° ° ° ° ° ° °Please contact your physician with any problems or Same Day Surgery at [phone], Monday through Friday 6 am to 4 pm, or [facility] number at [phone]. ° °

## 2015-09-25 ENCOUNTER — Encounter: Payer: Self-pay | Admitting: Orthopedic Surgery

## 2015-11-21 ENCOUNTER — Encounter: Payer: Self-pay | Admitting: Family Medicine

## 2015-11-21 ENCOUNTER — Ambulatory Visit (INDEPENDENT_AMBULATORY_CARE_PROVIDER_SITE_OTHER): Payer: Managed Care, Other (non HMO) | Admitting: Family Medicine

## 2015-11-21 VITALS — BP 92/55 | HR 56 | Temp 97.6°F | Ht 69.3 in | Wt 169.0 lb

## 2015-11-21 DIAGNOSIS — E785 Hyperlipidemia, unspecified: Secondary | ICD-10-CM | POA: Diagnosis not present

## 2015-11-21 DIAGNOSIS — E119 Type 2 diabetes mellitus without complications: Secondary | ICD-10-CM

## 2015-11-21 LAB — LP+ALT+AST PICCOLO, WAIVED
ALT (SGPT) Piccolo, Waived: 22 U/L (ref 10–47)
AST (SGOT) Piccolo, Waived: 25 U/L (ref 11–38)
CHOL/HDL RATIO PICCOLO,WAIVE: 3.1 mg/dL
Cholesterol Piccolo, Waived: 144 mg/dL (ref <200)
HDL Chol Piccolo, Waived: 46 mg/dL — ABNORMAL LOW (ref >59)
LDL CHOL CALC PICCOLO WAIVED: 75 mg/dL (ref <100)
Triglycerides Piccolo,Waived: 114 mg/dL (ref <150)
VLDL Chol Calc Piccolo,Waive: 23 mg/dL (ref <30)

## 2015-11-21 LAB — BAYER DCA HB A1C WAIVED: HB A1C (BAYER DCA - WAIVED): 7.6 % — ABNORMAL HIGH (ref <7.0)

## 2015-11-21 NOTE — Assessment & Plan Note (Signed)
Discussed diabetes care treatment need for better control patient will do better with diet exercise especially now he is status post knee surgery.

## 2015-11-21 NOTE — Assessment & Plan Note (Signed)
The current medical regimen is effective;  continue present plan and medications.  

## 2015-11-21 NOTE — Progress Notes (Signed)
BP 92/55 mmHg  Pulse 56  Temp(Src) 97.6 F (36.4 C)  Ht 5' 9.3" (1.76 m)  Wt 169 lb (76.658 kg)  BMI 24.75 kg/m2  SpO2 97%   Subjective:    Patient ID: [name], male    DOB: 03-23-1956, 60 y.o.   MRN: [id number]  HPI: [name] is a 60 y.o. male  Chief Complaint  Patient presents with  . Diabetes  . Hyperlipidemia  . Hypertension   patient recheck medical issues doing well no complaints with low blood sugar spells taking medications faithfully without problems No side effects to medications Takes occasional clonazepam but not every night and takes half a tablet. Recovering well from left knee surgery from Dr. Ernest Pine has been more inactive and postsurgical which may explain blood sugars being higher.  Relevant past medical, surgical, family and social history reviewed and updated as indicated. Interim medical history since our last visit reviewed. Allergies and medications reviewed and updated.  Review of Systems  Constitutional: Negative.   Respiratory: Negative.   Cardiovascular: Negative.     Per HPI unless specifically indicated above     Objective:    BP 92/55 mmHg  Pulse 56  Temp(Src) 97.6 F (36.4 C)  Ht 5' 9.3" (1.76 m)  Wt 169 lb (76.658 kg)  BMI 24.75 kg/m2  SpO2 97%  Wt Readings from Last 3 Encounters:  11/21/15 169 lb (76.658 kg)  09/24/15 169 lb (76.658 kg)  09/18/15 170 lb (77.111 kg)    Physical Exam  Constitutional: He is oriented to person, place, and time. He appears well-developed and well-nourished. No distress.  HENT:  Head: Normocephalic and atraumatic.  Right Ear: Hearing normal.  Left Ear: Hearing normal.  Nose: Nose normal.  Eyes: Conjunctivae and lids are normal. Right eye exhibits no discharge. Left eye exhibits no discharge. No scleral icterus.  Cardiovascular: Normal rate, regular rhythm and normal heart sounds.   Pulmonary/Chest: Effort normal and breath sounds normal. No respiratory distress.  Musculoskeletal:  Normal range of motion.  Neurological: He is alert and oriented to person, place, and time.  Skin: Skin is intact. No rash noted.  Psychiatric: He has a normal mood and affect. His speech is normal and behavior is normal. Judgment and thought content normal. Cognition and memory are normal.    Results for orders placed or performed during the hospital encounter of 09/24/15  Glucose, capillary  Result Value Ref Range   Glucose-Capillary 158 (H) 65 - 99 mg/dL   Comment 1 Notify RN   Glucose, capillary  Result Value Ref Range   Glucose-Capillary 164 (H) 65 - 99 mg/dL      Assessment & Plan:   Problem List Items Addressed This Visit      Endocrine   Diabetes mellitus without complication (HCC) - Primary    Discussed diabetes care treatment need for better control patient will do better with diet exercise especially now he is status post knee surgery.      Relevant Orders   LP+ALT+AST Piccolo, Waived   Bayer DCA Hb A1c Waived   Basic metabolic panel     Other   Hyperlipemia    The current medical regimen is effective;  continue present plan and medications.        Other Visit Diagnoses    Hyperlipidemia        Relevant Orders    LP+ALT+AST Piccolo, Waived    Bayer DCA Hb A1c Waived    Basic metabolic panel  Follow up plan: Return in about 3 months (around 02/18/2016) for Physical Exam  A1C.

## 2015-11-22 ENCOUNTER — Encounter: Payer: Self-pay | Admitting: Family Medicine

## 2015-11-22 LAB — BASIC METABOLIC PANEL
BUN/Creatinine Ratio: 22 (ref 10–22)
BUN: 22 mg/dL (ref 8–27)
CALCIUM: 9.4 mg/dL (ref 8.6–10.2)
CHLORIDE: 99 mmol/L (ref 96–106)
CO2: 24 mmol/L (ref 18–29)
Creatinine, Ser: 1.01 mg/dL (ref 0.76–1.27)
GFR calc non Af Amer: 80 mL/min/{1.73_m2} (ref >59)
GFR, EST AFRICAN AMERICAN: 92 mL/min/{1.73_m2} (ref >59)
GLUCOSE: 113 mg/dL — AB (ref 65–99)
POTASSIUM: 4 mmol/L (ref 3.5–5.2)
Sodium: 140 mmol/L (ref 134–144)

## 2015-12-10 ENCOUNTER — Telehealth: Payer: Self-pay | Admitting: Family Medicine

## 2015-12-10 MED ORDER — OSELTAMIVIR PHOSPHATE 75 MG PO CAPS: 75.0000 mg | ORAL_CAPSULE | Freq: Every day | ORAL | Status: DC

## 2015-12-10 NOTE — Telephone Encounter (Signed)
pts wife has been exposed to the flu and they were advised to get a rx for tamiflu.  Send to [facility].

## 2016-01-11 ENCOUNTER — Other Ambulatory Visit: Payer: Self-pay | Admitting: Family Medicine

## 2016-02-19 ENCOUNTER — Ambulatory Visit (INDEPENDENT_AMBULATORY_CARE_PROVIDER_SITE_OTHER): Payer: Managed Care, Other (non HMO) | Admitting: Family Medicine

## 2016-02-19 ENCOUNTER — Encounter: Payer: Self-pay | Admitting: Family Medicine

## 2016-02-19 VITALS — BP 108/61 | HR 55 | Temp 97.6°F | Ht 69.1 in | Wt 174.0 lb

## 2016-02-19 DIAGNOSIS — E785 Hyperlipidemia, unspecified: Secondary | ICD-10-CM | POA: Diagnosis not present

## 2016-02-19 DIAGNOSIS — E119 Type 2 diabetes mellitus without complications: Secondary | ICD-10-CM

## 2016-02-19 MED ORDER — SITAGLIPTIN PHOSPHATE 100 MG PO TABS: 100.0000 mg | ORAL_TABLET | Freq: Every day | ORAL | Status: DC

## 2016-02-19 MED ORDER — ESOMEPRAZOLE MAGNESIUM 40 MG PO PACK: 40.0000 mg | PACK | Freq: Every day | ORAL | Status: DC

## 2016-02-19 MED ORDER — DULAGLUTIDE 0.75 MG/0.5ML ~~LOC~~ SOAJ: SUBCUTANEOUS | Status: DC

## 2016-02-19 MED ORDER — METFORMIN HCL 500 MG PO TABS: 1000.0000 mg | ORAL_TABLET | Freq: Two times a day (BID) | ORAL | Status: DC

## 2016-02-19 MED ORDER — ATORVASTATIN CALCIUM 40 MG PO TABS: 40.0000 mg | ORAL_TABLET | Freq: Every day | ORAL | Status: DC

## 2016-02-19 MED ORDER — DAPAGLIFLOZIN PROPANEDIOL 10 MG PO TABS: 10.0000 mg | ORAL_TABLET | Freq: Every day | ORAL | Status: DC

## 2016-02-19 NOTE — Progress Notes (Signed)
BP 108/61 mmHg  Pulse 55  Temp(Src) 97.6 F (36.4 C)  Ht 5' 9.1" (1.755 m)  Wt 174 lb (78.926 kg)  BMI 25.63 kg/m2  SpO2 98%   Subjective:    Patient ID: [name], male    DOB: Jan 07, 1956, 60 y.o.   MRN: [id number]  HPI: [name] is a 60 y.o. male  Chief Complaint  Patient presents with  . Diabetes  Patient doing much worse gained weight a great deal of stress in his life with wife's illness. Diets been poor and glucose going up. Taking medications but stress and diet is weaning out. Other medications cholesterol doing okay taking occasional clonazepam Patient is having some jock itch issues  Relevant past medical, surgical, family and social history reviewed and updated as indicated. Interim medical history since our last visit reviewed. Allergies and medications reviewed and updated.  Review of Systems  Constitutional: Negative.   Respiratory: Negative.   Cardiovascular: Negative.     Per HPI unless specifically indicated above     Objective:    BP 108/61 mmHg  Pulse 55  Temp(Src) 97.6 F (36.4 C)  Ht 5' 9.1" (1.755 m)  Wt 174 lb (78.926 kg)  BMI 25.63 kg/m2  SpO2 98%  Wt Readings from Last 3 Encounters:  02/19/16 174 lb (78.926 kg)  11/21/15 169 lb (76.658 kg)  09/24/15 169 lb (76.658 kg)    Physical Exam  Constitutional: He is oriented to person, place, and time. He appears well-developed and well-nourished. No distress.  HENT:  Head: Normocephalic and atraumatic.  Right Ear: Hearing normal.  Left Ear: Hearing normal.  Nose: Nose normal.  Eyes: Conjunctivae and lids are normal. Right eye exhibits no discharge. Left eye exhibits no discharge. No scleral icterus.  Cardiovascular: Normal rate, regular rhythm and normal heart sounds.   Pulmonary/Chest: Effort normal and breath sounds normal. No respiratory distress.  Musculoskeletal: Normal range of motion.  Neurological: He is alert and oriented to person, place, and time.  Skin: Skin is  intact. Rash noted.  TINIA CHANGES  Psychiatric: He has a normal mood and affect. His speech is normal and behavior is normal. Judgment and thought content normal. Cognition and memory are normal.    Results for orders placed or performed in visit on 11/21/15  LP+ALT+AST Piccolo, Arrow ElectronicsWaived  Result Value Ref Range   ALT (SGPT) Piccolo, Waived 22 10 - 47 U/L   AST (SGOT) Piccolo, Waived 25 11 - 38 U/L   Cholesterol Piccolo, Waived 144 <200 mg/dL   HDL Chol Piccolo, Waived 46 (L) >59 mg/dL   Triglycerides Piccolo,Waived 114 <150 mg/dL   Chol/HDL Ratio Piccolo,Waive 3.1 mg/dL   LDL Chol Calc Piccolo Waived 75 <100 mg/dL   VLDL Chol Calc Piccolo,Waive 23 <30 mg/dL  Bayer DCA Hb N8GA1c Waived  Result Value Ref Range   Bayer DCA Hb A1c Waived 7.6 (H) <7.0 %  Basic metabolic panel  Result Value Ref Range   Glucose 113 (H) 65 - 99 mg/dL   BUN 22 8 - 27 mg/dL   Creatinine, Ser [id number] 0.76 - 1.27 mg/dL   GFR calc non Af Amer 80 >59 mL/min/1.73   GFR calc Af Amer 92 >59 mL/min/1.73   BUN/Creatinine Ratio 22 10 - 22   Sodium 140 134 - 144 mmol/L   Potassium 4.0 3.5 - 5.2 mmol/L   Chloride 99 96 - 106 mmol/L   CO2 24 18 - 29 mmol/L   Calcium 9.4 8.6 - 10.2  mg/dL      Assessment & Plan:   Problem List Items Addressed This Visit      Endocrine   Diabetes mellitus without complication (HCC) - Primary    Discussed with patient diabetes poor control and getting worse with weight gain will continue all current medications add Trulicity patient self injected for shot today.      Relevant Medications   Dulaglutide (TRULICITY) 0.75 MG/0.5ML SOPN   sitaGLIPtin (JANUVIA) 100 MG tablet   metFORMIN (GLUCOPHAGE) 500 MG tablet   dapagliflozin propanediol (FARXIGA) 10 MG TABS tablet   atorvastatin (LIPITOR) 40 MG tablet   Other Relevant Orders   Bayer DCA Hb A1c Waived     Other   Hyperlipemia    The current medical regimen is effective;  continue present plan and medications.       Relevant  Medications   atorvastatin (LIPITOR) 40 MG tablet       Follow up plan: Return in about 3 months (around 05/21/2016) for Physical Exam a1c.

## 2016-02-19 NOTE — Assessment & Plan Note (Signed)
The current medical regimen is effective;  continue present plan and medications.  

## 2016-02-19 NOTE — Assessment & Plan Note (Signed)
Discussed with patient diabetes poor control and getting worse with weight gain will continue all current medications add Trulicity patient self injected for shot today.

## 2016-02-20 LAB — BAYER DCA HB A1C WAIVED: HB A1C (BAYER DCA - WAIVED): 7.7 % — ABNORMAL HIGH (ref <7.0)

## 2016-04-07 ENCOUNTER — Other Ambulatory Visit: Payer: Self-pay | Admitting: Family Medicine

## 2016-04-07 NOTE — Telephone Encounter (Signed)
fax

## 2016-04-29 ENCOUNTER — Telehealth: Payer: Self-pay | Admitting: Family Medicine

## 2016-04-29 MED ORDER — DULAGLUTIDE 0.75 MG/0.5ML ~~LOC~~ SOAJ: SUBCUTANEOUS | Status: DC

## 2016-04-29 NOTE — Telephone Encounter (Signed)
Pt needs rx for 90 day supply of Dulaglutide (TRULICITY) 0.75 MG/0.5ML SOPN as well as a 90 day supply of one touch ultra test strips(check 3 times a day) and one touch meter [facility]

## 2016-04-29 NOTE — Telephone Encounter (Signed)
Trulicity sent through. Can we fill out one of those DM Rxs and I'll sign it? Thanks!

## 2016-05-29 ENCOUNTER — Encounter: Payer: Managed Care, Other (non HMO) | Admitting: Family Medicine

## 2016-06-12 ENCOUNTER — Other Ambulatory Visit: Payer: Self-pay | Admitting: Family Medicine

## 2016-06-12 NOTE — Telephone Encounter (Signed)
Pt needs test strips to test twice a day for 90 days sent to [facility] One touch ultra 2

## 2016-06-12 NOTE — Telephone Encounter (Signed)
RX form filled out. Will get Dr. Dossie Arbourrissman to sign and fax to pharmacy.

## 2016-06-12 NOTE — Telephone Encounter (Signed)
RX faxed to pharmacy.

## 2016-06-19 ENCOUNTER — Other Ambulatory Visit: Payer: Self-pay | Admitting: Family Medicine

## 2016-06-19 NOTE — Telephone Encounter (Signed)
Pt needs lancets for one touch ultra to test 3 times a day. To [facility].

## 2016-06-19 NOTE — Telephone Encounter (Signed)
RX form filled out. Will get Fleet ContrasRachel to sign since Dr. Dossie Arbourrissman is not in the office. Then I will fax to the pharmacy.

## 2016-06-19 NOTE — Telephone Encounter (Signed)
RX faxed to pharmacy.

## 2016-06-24 ENCOUNTER — Other Ambulatory Visit: Payer: Self-pay | Admitting: Family Medicine

## 2016-06-24 NOTE — Telephone Encounter (Signed)
This RX was sent in twice last week so I called the pharmacy to confirm that they got it and they stated that they did. Will call patient's wife and let her know.

## 2016-06-24 NOTE — Telephone Encounter (Signed)
Called and let patient's wife know that rx was already sent in and that the pharmacy stated that they had it.

## 2016-07-08 ENCOUNTER — Encounter (INDEPENDENT_AMBULATORY_CARE_PROVIDER_SITE_OTHER): Payer: Self-pay

## 2016-07-23 ENCOUNTER — Other Ambulatory Visit: Payer: Self-pay | Admitting: Family Medicine

## 2016-07-23 MED ORDER — AZITHROMYCIN 250 MG PO TABS: ORAL_TABLET | ORAL | 0 refills | Status: DC

## 2016-07-29 ENCOUNTER — Encounter: Payer: Self-pay | Admitting: Family Medicine

## 2016-07-29 ENCOUNTER — Ambulatory Visit (INDEPENDENT_AMBULATORY_CARE_PROVIDER_SITE_OTHER): Payer: Managed Care, Other (non HMO) | Admitting: Family Medicine

## 2016-07-29 VITALS — BP 122/66 | HR 57 | Temp 97.8°F | Ht 70.0 in | Wt 163.6 lb

## 2016-07-29 DIAGNOSIS — E7800 Pure hypercholesterolemia, unspecified: Secondary | ICD-10-CM | POA: Diagnosis not present

## 2016-07-29 DIAGNOSIS — E119 Type 2 diabetes mellitus without complications: Secondary | ICD-10-CM

## 2016-07-29 DIAGNOSIS — F5102 Adjustment insomnia: Secondary | ICD-10-CM | POA: Diagnosis not present

## 2016-07-29 DIAGNOSIS — Z23 Encounter for immunization: Secondary | ICD-10-CM

## 2016-07-29 DIAGNOSIS — Z0000 Encounter for general adult medical examination without abnormal findings: Secondary | ICD-10-CM | POA: Diagnosis not present

## 2016-07-29 LAB — MICROSCOPIC EXAMINATION: WBC, UA: NONE SEEN /hpf (ref 0–?)

## 2016-07-29 LAB — URINALYSIS, ROUTINE W REFLEX MICROSCOPIC
BILIRUBIN UA: NEGATIVE
Ketones, UA: NEGATIVE
LEUKOCYTES UA: NEGATIVE
Nitrite, UA: NEGATIVE
PH UA: 5.5 (ref 5.0–7.5)
PROTEIN UA: NEGATIVE
RBC, UA: NEGATIVE
Specific Gravity, UA: 1.02 (ref 1.005–1.030)
UUROB: 0.2 mg/dL (ref 0.2–1.0)

## 2016-07-29 LAB — BAYER DCA HB A1C WAIVED: HB A1C: 6.4 % (ref <7.0)

## 2016-07-29 MED ORDER — ATORVASTATIN CALCIUM 40 MG PO TABS: 40.0000 mg | ORAL_TABLET | Freq: Every day | ORAL | 4 refills | Status: DC

## 2016-07-29 MED ORDER — METFORMIN HCL 500 MG PO TABS: 1000.0000 mg | ORAL_TABLET | Freq: Two times a day (BID) | ORAL | 4 refills | Status: DC

## 2016-07-29 MED ORDER — NAPROXEN 500 MG PO TABS: 500.0000 mg | ORAL_TABLET | Freq: Every day | ORAL | 1 refills | Status: DC | PRN

## 2016-07-29 MED ORDER — ESOMEPRAZOLE MAGNESIUM 40 MG PO PACK
40.0000 mg | PACK | Freq: Every day | ORAL | 4 refills | Status: DC
Start: 2016-07-29 — End: 2017-08-24

## 2016-07-29 MED ORDER — SUCRALFATE 1 G PO TABS: 1.0000 g | ORAL_TABLET | Freq: Two times a day (BID) | ORAL | 4 refills | Status: DC

## 2016-07-29 MED ORDER — CLONAZEPAM 1 MG PO TABS: 1.0000 mg | ORAL_TABLET | Freq: Every day | ORAL | 1 refills | Status: DC | PRN

## 2016-07-29 MED ORDER — SITAGLIPTIN PHOSPHATE 100 MG PO TABS: 100.0000 mg | ORAL_TABLET | Freq: Every day | ORAL | 4 refills | Status: DC

## 2016-07-29 MED ORDER — DAPAGLIFLOZIN PROPANEDIOL 10 MG PO TABS: 10.0000 mg | ORAL_TABLET | Freq: Every day | ORAL | 4 refills | Status: DC

## 2016-07-29 MED ORDER — ONDANSETRON 4 MG PO TBDP: 4.0000 mg | ORAL_TABLET | Freq: Three times a day (TID) | ORAL | 1 refills | Status: DC | PRN

## 2016-07-29 MED ORDER — DULAGLUTIDE 0.75 MG/0.5ML ~~LOC~~ SOAJ: SUBCUTANEOUS | 4 refills | Status: DC

## 2016-07-29 NOTE — Assessment & Plan Note (Signed)
The current medical regimen is effective;  continue present plan and medications.  

## 2016-07-29 NOTE — Progress Notes (Addendum)
BP 122/66 (BP Location: Left Arm, Patient Position: Sitting, Cuff Size: Normal)   Pulse (!) 57   Temp 97.8 F (36.6 C)   Ht 5\' 10"  (1.778 m)   Wt 163 lb 9.6 oz (74.2 kg)   SpO2 98%   BMI 23.47 kg/m    Subjective:    Patient ID: [name], male    DOB: 05-16-1956, 60 y.o.   MRN: [id number]  HPI: [name] is a 60 y.o. male  Chief Complaint  Patient presents with  . Annual Exam   Patient all in all doing well has been taking Trulicity without problems noted low blood sugar spells no issues with diabetes. Not sure how his blood sugars been doing. Taking Lipitor without problems Occasionally gets some much on his mind with worry and concern takes an occasional clonazepam half a tablet especially at night to help with sleep. Has 10 left over in the bottle and his use the refill. Taking Naprosyn every other day On further discussion patient's sleep issues primarily work stress and time shift disorder with now working second shift. Patient's eating schedule his first shift. Patient also started on Carafate that his wife had seems to help to great deal with stomach aching gas and other GI issues wants to continue. Taking 2 Carafate a day morning and evening.  Relevant past medical, surgical, family and social history reviewed and updated as indicated. Interim medical history since our last visit reviewed. Allergies and medications reviewed and updated.  Review of Systems  Constitutional: Negative.   HENT: Negative.   Eyes: Negative.   Respiratory: Negative.   Cardiovascular: Negative.   Gastrointestinal: Negative.   Endocrine: Negative.   Genitourinary: Negative.   Musculoskeletal: Negative.   Skin: Negative.   Allergic/Immunologic: Negative.   Neurological: Negative.   Hematological: Negative.   Psychiatric/Behavioral: Negative.     Per HPI unless specifically indicated above     Objective:    BP 122/66 (BP Location: Left Arm, Patient Position: Sitting, Cuff  Size: Normal)   Pulse (!) 57   Temp 97.8 F (36.6 C)   Ht 5\' 10"  (1.778 m)   Wt 163 lb 9.6 oz (74.2 kg)   SpO2 98%   BMI 23.47 kg/m   Wt Readings from Last 3 Encounters:  07/29/16 163 lb 9.6 oz (74.2 kg)  02/19/16 174 lb (78.9 kg)  11/21/15 169 lb (76.7 kg)    Physical Exam  Constitutional: He is oriented to person, place, and time. He appears well-developed and well-nourished.  HENT:  Head: Normocephalic and atraumatic.  Right Ear: External ear normal.  Left Ear: External ear normal.  Eyes: Conjunctivae and EOM are normal. Pupils are equal, round, and reactive to light.  Neck: Normal range of motion. Neck supple.  Cardiovascular: Normal rate, regular rhythm, normal heart sounds and intact distal pulses.   Pulmonary/Chest: Effort normal and breath sounds normal.  Abdominal: Soft. Bowel sounds are normal. There is no splenomegaly or hepatomegaly.  Genitourinary: Rectum normal, prostate normal and penis normal.  Musculoskeletal: Normal range of motion.  Neurological: He is alert and oriented to person, place, and time. He has normal reflexes.  Skin: No rash noted. No erythema.  Psychiatric: He has a normal mood and affect. His behavior is normal. Judgment and thought content normal.    Results for orders placed or performed in visit on 02/19/16  Bayer DCA Hb A1c Waived  Result Value Ref Range   Bayer DCA Hb A1c Waived 7.7 (H) <7.0 %  Assessment & Plan:   Problem List Items Addressed This Visit      Endocrine   Diabetes mellitus without complication (HCC)    The current medical regimen is effective;  continue present plan and medications.       Relevant Medications   sitaGLIPtin (JANUVIA) 100 MG tablet   metFORMIN (GLUCOPHAGE) 500 MG tablet   Dulaglutide (TRULICITY) 0.75 MG/0.5ML SOPN   dapagliflozin propanediol (FARXIGA) 10 MG TABS tablet   atorvastatin (LIPITOR) 40 MG tablet   Other Relevant Orders   Comprehensive metabolic panel   Lipid panel   CBC with  Differential/Platelet   TSH   Urinalysis, Routine w reflex microscopic (not at [facility])   PSA   Bayer DCA Hb A1c Waived     Other   Hyperlipemia    The current medical regimen is effective;  continue present plan and medications.       Relevant Medications   atorvastatin (LIPITOR) 40 MG tablet   Other Relevant Orders   Comprehensive metabolic panel   Lipid panel   CBC with Differential/Platelet   TSH   Urinalysis, Routine w reflex microscopic (not at [facility])   PSA   Bayer DCA Hb A1c Waived   Insomnia due to stress    Discuss use of clonazepam to use sparingly discuss adjusting daytime schedule to nighttime schedule.      Relevant Medications   clonazePAM (KLONOPIN) 1 MG tablet    Other Visit Diagnoses    Need for influenza vaccination    -  Primary   Relevant Orders   Flu Vaccine QUAD 36+ mos PF IM (Fluarix & Fluzone Quad PF) (Completed)   Annual physical exam       Relevant Orders   Hepatitis C antibody   HIV antibody       Follow up plan: Return in about 3 months (around 10/29/2016) for Hemoglobin A1c.

## 2016-07-29 NOTE — Addendum Note (Signed)
Addended byVonita Moss: Lorelei Heikkila on: 07/29/2016 09:18 AM   Modules accepted: Orders

## 2016-07-29 NOTE — Addendum Note (Signed)
Addended byVonita Moss: Goldie Tregoning on: 07/29/2016 09:16 AM   Modules accepted: Orders

## 2016-07-29 NOTE — Assessment & Plan Note (Signed)
Discuss use of clonazepam to use sparingly discuss adjusting daytime schedule to nighttime schedule.

## 2016-07-30 ENCOUNTER — Encounter: Payer: Self-pay | Admitting: Family Medicine

## 2016-07-30 LAB — HIV ANTIBODY (ROUTINE TESTING W REFLEX): HIV Screen 4th Generation wRfx: NONREACTIVE

## 2016-07-30 LAB — CBC WITH DIFFERENTIAL/PLATELET
BASOS ABS: 0 10*3/uL (ref 0.0–0.2)
Basos: 0 %
EOS (ABSOLUTE): 0.3 10*3/uL (ref 0.0–0.4)
EOS: 5 %
HEMOGLOBIN: 14.2 g/dL (ref 12.6–17.7)
Hematocrit: 43.4 % (ref 37.5–51.0)
IMMATURE GRANS (ABS): 0 10*3/uL (ref 0.0–0.1)
IMMATURE GRANULOCYTES: 0 %
LYMPHS: 34 %
Lymphocytes Absolute: 1.7 10*3/uL (ref 0.7–3.1)
MCH: 30 pg (ref 26.6–33.0)
MCHC: 32.7 g/dL (ref 31.5–35.7)
MCV: 92 fL (ref 79–97)
MONOCYTES: 13 %
Monocytes Absolute: 0.7 10*3/uL (ref 0.1–0.9)
NEUTROS PCT: 48 %
Neutrophils Absolute: 2.3 10*3/uL (ref 1.4–7.0)
Platelets: 166 10*3/uL (ref 150–379)
RBC: 4.74 x10E6/uL (ref 4.14–5.80)
RDW: 14.2 % (ref 12.3–15.4)
WBC: 5 10*3/uL (ref 3.4–10.8)

## 2016-07-30 LAB — COMPREHENSIVE METABOLIC PANEL
A/G RATIO: 1.8 (ref 1.2–2.2)
ALT: 17 IU/L (ref 0–44)
AST: 17 IU/L (ref 0–40)
Albumin: 4.3 g/dL (ref 3.6–4.8)
Alkaline Phosphatase: 51 IU/L (ref 39–117)
BILIRUBIN TOTAL: 0.4 mg/dL (ref 0.0–1.2)
BUN/Creatinine Ratio: 25 — ABNORMAL HIGH (ref 10–24)
BUN: 22 mg/dL (ref 8–27)
CALCIUM: 9.4 mg/dL (ref 8.6–10.2)
CHLORIDE: 103 mmol/L (ref 96–106)
CO2: 23 mmol/L (ref 18–29)
Creatinine, Ser: 0.89 mg/dL (ref 0.76–1.27)
GFR, EST AFRICAN AMERICAN: 106 mL/min/{1.73_m2} (ref >59)
GFR, EST NON AFRICAN AMERICAN: 92 mL/min/{1.73_m2} (ref >59)
GLOBULIN, TOTAL: 2.4 g/dL (ref 1.5–4.5)
Glucose: 98 mg/dL (ref 65–99)
POTASSIUM: 4 mmol/L (ref 3.5–5.2)
SODIUM: 144 mmol/L (ref 134–144)
TOTAL PROTEIN: 6.7 g/dL (ref 6.0–8.5)

## 2016-07-30 LAB — HEPATITIS C ANTIBODY

## 2016-07-30 LAB — LIPID PANEL
CHOL/HDL RATIO: 2.8 ratio (ref 0.0–5.0)
Cholesterol, Total: 128 mg/dL (ref 100–199)
HDL: 45 mg/dL (ref >39)
LDL Calculated: 65 mg/dL (ref 0–99)
Triglycerides: 90 mg/dL (ref 0–149)
VLDL Cholesterol Cal: 18 mg/dL (ref 5–40)

## 2016-07-30 LAB — TSH: TSH: 2.16 u[IU]/mL (ref 0.450–4.500)

## 2016-07-30 LAB — PSA: PROSTATE SPECIFIC AG, SERUM: 1.5 ng/mL (ref 0.0–4.0)

## 2016-11-05 ENCOUNTER — Ambulatory Visit: Payer: Self-pay | Admitting: Family Medicine

## 2016-11-17 ENCOUNTER — Telehealth: Payer: Self-pay | Admitting: Family Medicine

## 2016-11-20 ENCOUNTER — Encounter: Payer: Self-pay | Admitting: Family Medicine

## 2016-11-20 ENCOUNTER — Ambulatory Visit (INDEPENDENT_AMBULATORY_CARE_PROVIDER_SITE_OTHER): Payer: Commercial Managed Care - PPO | Admitting: Family Medicine

## 2016-11-20 VITALS — BP 118/62 | HR 71 | Wt 169.2 lb

## 2016-11-20 DIAGNOSIS — E7800 Pure hypercholesterolemia, unspecified: Secondary | ICD-10-CM

## 2016-11-20 DIAGNOSIS — Z23 Encounter for immunization: Secondary | ICD-10-CM | POA: Diagnosis not present

## 2016-11-20 DIAGNOSIS — E119 Type 2 diabetes mellitus without complications: Secondary | ICD-10-CM | POA: Diagnosis not present

## 2016-11-20 DIAGNOSIS — F5102 Adjustment insomnia: Secondary | ICD-10-CM | POA: Diagnosis not present

## 2016-11-20 DIAGNOSIS — Z2911 Encounter for prophylactic immunotherapy for respiratory syncytial virus (RSV): Secondary | ICD-10-CM

## 2016-11-20 LAB — MICROALBUMIN, URINE WAIVED
CREATININE, URINE WAIVED: 200 mg/dL (ref 10–300)
MICROALB, UR WAIVED: 10 mg/L (ref 0–19)
Microalb/Creat Ratio: <30 mg/g (ref <30)

## 2016-11-20 LAB — BAYER DCA HB A1C WAIVED: HB A1C: 6.6 % (ref <7.0)

## 2016-11-20 NOTE — Assessment & Plan Note (Signed)
The current medical regimen is effective;  continue present plan and medications.  

## 2016-11-20 NOTE — Progress Notes (Signed)
BP 118/62   Pulse 71   Wt 169 lb 3.2 oz (76.7 kg)   SpO2 97%   BMI 24.28 kg/m    Subjective:    Patient ID: [name], male    DOB: 1956/09/17, 61 y.o.   MRN: [id number]  HPI: [name] is a 61 y.o. male  Chief Complaint  Patient presents with  . Follow-up  . Immunizations  Follow-up diabetes doing well plain with medications by intermittently with car seat and Januvia blood sugar sometimes up and down but all in all doing well with no real low blood sugar spells reports sometimes is low ais around 80 but no other side effects associated. Highs go up to around 156. Patient taking clonazepam maybe 1-1/2 a week or so uses for sleep in the meantime takes melatonin just about every night. Does fine with atorvastatin. Patient's insurance is changed not sure how this can affect his medication will roll with the patches discussed trying different medications of the same group and checking with pharmacy if prices change. Discuss Nexium doesn't seem to do as well as Prilosec will change to Prilosec for reflux.  Relevant past medical, surgical, family and social history reviewed and updated as indicated. Interim medical history since our last visit reviewed. Allergies and medications reviewed and updated.  Review of Systems  Constitutional: Negative.   Respiratory: Negative.   Cardiovascular: Negative.     Per HPI unless specifically indicated above     Objective:    BP 118/62   Pulse 71   Wt 169 lb 3.2 oz (76.7 kg)   SpO2 97%   BMI 24.28 kg/m   Wt Readings from Last 3 Encounters:  11/20/16 169 lb 3.2 oz (76.7 kg)  07/29/16 163 lb 9.6 oz (74.2 kg)  02/19/16 174 lb (78.9 kg)    Physical Exam  Results for orders placed or performed in visit on 07/29/16  Microscopic Examination  Result Value Ref Range   WBC, UA None seen 0 - 5 /hpf   RBC, UA 0-2 0 - 2 /hpf   Epithelial Cells (non renal) 0-10 0 - 10 /hpf  Comprehensive metabolic panel  Result Value Ref Range     Glucose 98 65 - 99 mg/dL   BUN 22 8 - 27 mg/dL   Creatinine, Ser 1.19 0.76 - 1.27 mg/dL   GFR calc non Af Amer 92 >59 mL/min/1.73   GFR calc Af Amer 106 >59 mL/min/1.73   BUN/Creatinine Ratio 25 (H) 10 - 24   Sodium 144 134 - 144 mmol/L   Potassium 4.0 3.5 - 5.2 mmol/L   Chloride 103 96 - 106 mmol/L   CO2 23 18 - 29 mmol/L   Calcium 9.4 8.6 - 10.2 mg/dL   Total Protein 6.7 6.0 - 8.5 g/dL   Albumin 4.3 3.6 - 4.8 g/dL   Globulin, Total 2.4 1.5 - 4.5 g/dL   Albumin/Globulin Ratio 1.8 1.2 - 2.2   Bilirubin Total 0.4 0.0 - 1.2 mg/dL   Alkaline Phosphatase 51 39 - 117 IU/L   AST 17 0 - 40 IU/L   ALT 17 0 - 44 IU/L  Lipid panel  Result Value Ref Range   Cholesterol, Total 128 100 - 199 mg/dL   Triglycerides 90 0 - 149 mg/dL   HDL 45 >14 mg/dL   VLDL Cholesterol Cal 18 5 - 40 mg/dL   LDL Calculated 65 0 - 99 mg/dL   Chol/HDL Ratio 2.8 0.0 - 5.0 ratio units  CBC with Differential/Platelet  Result Value Ref Range   WBC 5.0 3.4 - 10.8 x10E3/uL   RBC 4.74 4.14 - 5.80 x10E6/uL   Hemoglobin 14.2 12.6 - 17.7 g/dL   Hematocrit 78.443.4 69.637.5 - 51.0 %   MCV 92 79 - 97 fL   MCH 30.0 26.6 - 33.0 pg   MCHC 32.7 31.5 - 35.7 g/dL   RDW 29.514.2 28.412.3 - 13.215.4 %   Platelets 166 150 - 379 x10E3/uL   Neutrophils 48 Not Estab. %   Lymphs 34 Not Estab. %   Monocytes 13 Not Estab. %   Eos 5 Not Estab. %   Basos 0 Not Estab. %   Neutrophils Absolute 2.3 1.4 - 7.0 x10E3/uL   Lymphocytes Absolute 1.7 0.7 - 3.1 x10E3/uL   Monocytes Absolute 0.7 0.1 - 0.9 x10E3/uL   EOS (ABSOLUTE) 0.3 0.0 - 0.4 x10E3/uL   Basophils Absolute 0.0 0.0 - 0.2 x10E3/uL   Immature Granulocytes 0 Not Estab. %   Immature Grans (Abs) 0.0 0.0 - 0.1 x10E3/uL  TSH  Result Value Ref Range   TSH 2.160 0.450 - 4.500 uIU/mL  Urinalysis, Routine w reflex microscopic (not [facility])  Result Value Ref Range   Specific Gravity, UA 1.020 1.005 - 1.030   pH, UA 5.5 5.0 - 7.5   Color, UA Yellow Yellow   Appearance Ur Clear Clear    Leukocytes, UA Negative Negative   Protein, UA Negative Negative/Trace   Glucose, UA 3+ (A) Negative   Ketones, UA Negative Negative   RBC, UA Negative Negative   Bilirubin, UA Negative Negative   Urobilinogen, Ur 0.2 0.2 - 1.0 mg/dL   Nitrite, UA Negative Negative   Microscopic Examination See below:   PSA  Result Value Ref Range   Prostate Specific Ag, Serum 1.5 0.0 - 4.0 ng/mL  Bayer DCA Hb A1c Waived  Result Value Ref Range   Bayer DCA Hb A1c Waived 6.4 <7.0 %  Hepatitis C antibody  Result Value Ref Range   Hep C Virus Ab <0.1 0.0 - 0.9 s/co ratio  HIV antibody  Result Value Ref Range   HIV Screen 4th Generation wRfx Non Reactive Non Reactive      Assessment & Plan:   Problem List Items Addressed This Visit      Endocrine   Diabetes mellitus without complication (HCC) - Primary    The current medical regimen is effective;  continue present plan and medications.       Relevant Orders   Bayer DCA Hb A1c Waived (STAT)   Microalbumin, Urine Waived     Other   Hyperlipemia    The current medical regimen is effective;  continue present plan and medications.       Relevant Orders   Bayer DCA Hb A1c Waived (STAT)   Microalbumin, Urine Waived   Insomnia due to stress    The current medical regimen is effective;  continue present plan and medications. doing well with interment use       Other Visit Diagnoses    Need for Zostavax administration           Follow up plan: Return in about 3 months (around 02/17/2017) for Hemoglobin A1c, BMP,  Lipids, ALT, AST.

## 2016-11-20 NOTE — Assessment & Plan Note (Signed)
The current medical regimen is effective;  continue present plan and medications. doing well with interment use

## 2017-01-06 NOTE — Telephone Encounter (Signed)
Error note

## 2017-01-11 ENCOUNTER — Other Ambulatory Visit: Payer: Self-pay | Admitting: Family Medicine

## 2017-01-14 LAB — HM DIABETES EYE EXAM

## 2017-01-19 ENCOUNTER — Other Ambulatory Visit: Payer: Self-pay

## 2017-01-19 DIAGNOSIS — E119 Type 2 diabetes mellitus without complications: Secondary | ICD-10-CM

## 2017-01-19 NOTE — Telephone Encounter (Signed)
Refill request for Farxiga 10 and Januvia 100.  Our system says he was given a year supply in October 2017.  [facility] w/ 2 refill on both starting May 2017.  Could you disregard other and resend these. Patient has upcoming appointment 02/17/2017

## 2017-02-10 ENCOUNTER — Other Ambulatory Visit: Payer: Self-pay | Admitting: Otolaryngology

## 2017-02-10 DIAGNOSIS — H93A1 Pulsatile tinnitus, right ear: Secondary | ICD-10-CM

## 2017-02-16 ENCOUNTER — Ambulatory Visit
Admission: RE | Admit: 2017-02-16 | Discharge: 2017-02-16 | Disposition: A | Discharge status: Home / Self Care | Payer: Commercial Managed Care - PPO | Source: Ambulatory Visit | Attending: Otolaryngology | Admitting: Otolaryngology

## 2017-02-16 DIAGNOSIS — I6523 Occlusion and stenosis of bilateral carotid arteries: Secondary | ICD-10-CM | POA: Diagnosis not present

## 2017-02-16 DIAGNOSIS — H93A1 Pulsatile tinnitus, right ear: Secondary | ICD-10-CM | POA: Diagnosis present

## 2017-02-17 ENCOUNTER — Ambulatory Visit: Payer: Commercial Managed Care - PPO | Admitting: Family Medicine

## 2017-02-17 ENCOUNTER — Other Ambulatory Visit: Payer: Self-pay | Admitting: Otolaryngology

## 2017-02-17 DIAGNOSIS — H93A1 Pulsatile tinnitus, right ear: Secondary | ICD-10-CM

## 2017-02-27 ENCOUNTER — Other Ambulatory Visit: Payer: Commercial Managed Care - PPO

## 2017-02-27 ENCOUNTER — Ambulatory Visit: Payer: Commercial Managed Care - PPO

## 2017-03-07 ENCOUNTER — Ambulatory Visit
Admission: RE | Admit: 2017-03-07 | Discharge: 2017-03-07 | Disposition: A | Discharge status: Home / Self Care | Payer: Commercial Managed Care - PPO | Source: Ambulatory Visit | Attending: Otolaryngology | Admitting: Otolaryngology

## 2017-03-07 DIAGNOSIS — H93A1 Pulsatile tinnitus, right ear: Secondary | ICD-10-CM

## 2017-03-07 LAB — POCT I-STAT CREATININE: Creatinine, Ser: 1 mg/dL (ref 0.61–1.24)

## 2017-03-07 MED ORDER — GADOBENATE DIMEGLUMINE 529 MG/ML IV SOLN
15.0000 mL | Freq: Once | INTRAVENOUS | Status: AC | PRN
  Administered 2017-03-07: 15 mL via INTRAVENOUS

## 2017-03-18 ENCOUNTER — Encounter: Payer: Self-pay | Admitting: Family Medicine

## 2017-03-18 ENCOUNTER — Ambulatory Visit (INDEPENDENT_AMBULATORY_CARE_PROVIDER_SITE_OTHER): Payer: Commercial Managed Care - PPO | Admitting: Family Medicine

## 2017-03-18 VITALS — BP 118/60 | HR 67 | Ht 71.5 in | Wt 175.0 lb

## 2017-03-18 DIAGNOSIS — E119 Type 2 diabetes mellitus without complications: Secondary | ICD-10-CM

## 2017-03-18 DIAGNOSIS — Z79899 Other long term (current) drug therapy: Secondary | ICD-10-CM | POA: Diagnosis not present

## 2017-03-18 DIAGNOSIS — E7800 Pure hypercholesterolemia, unspecified: Secondary | ICD-10-CM | POA: Diagnosis not present

## 2017-03-18 DIAGNOSIS — J019 Acute sinusitis, unspecified: Secondary | ICD-10-CM | POA: Diagnosis not present

## 2017-03-18 MED ORDER — AZITHROMYCIN 250 MG PO TABS: ORAL_TABLET | ORAL | 0 refills | Status: DC

## 2017-03-18 NOTE — Assessment & Plan Note (Signed)
The current medical regimen is effective;  continue present plan and medications.  

## 2017-03-18 NOTE — Progress Notes (Signed)
BP 118/60   Pulse 67   Ht 5' 11.5" (1.816 m)   Wt 175 lb (79.4 kg)   SpO2 97%   BMI 24.07 kg/m    Subjective:    Patient ID: [name], male    DOB: 09/17/1956, 61 y.o.   MRN: [id number]  HPI: [name] is a 61 y.o. male  Chief Complaint  Patient presents with  . Follow-up  . Diabetes  Patient all in all doing really well is cut back on metformin in the evening as improved his diet to eating less junk more healthy food and blood sugars doing significantly better with intense monitoring very little elevated excursions. Doing fine with Odessa Fleming and metformin that thousand milligrams in the morning. Hardly taking clonazepam at all. Taking Lipitor no issues.  also with worsening sinus congestion drainage pressure and some fever has pressure sensation in his head.  Relevant past medical, surgical, family and social history reviewed and updated as indicated. Interim medical history since our last visit reviewed. Allergies and medications reviewed and updated.  Review of Systems  Constitutional: Positive for chills, diaphoresis, fatigue and fever.  HENT: Positive for congestion, postnasal drip, rhinorrhea, sinus pain, sinus pressure and sneezing.   Respiratory: Negative.   Cardiovascular: Negative.     Per HPI unless specifically indicated above     Objective:    BP 118/60   Pulse 67   Ht 5' 11.5" (1.816 m)   Wt 175 lb (79.4 kg)   SpO2 97%   BMI 24.07 kg/m   Wt Readings from Last 3 Encounters:  03/18/17 175 lb (79.4 kg)  11/20/16 169 lb 3.2 oz (76.7 kg)  07/29/16 163 lb 9.6 oz (74.2 kg)    Physical Exam  Constitutional: He is oriented to person, place, and time. He appears well-developed and well-nourished.  HENT:  Head: Normocephalic and atraumatic.  Eyes: Conjunctivae and EOM are normal.  Neck: Normal range of motion.  Cardiovascular: Normal rate, regular rhythm and normal heart sounds.   Pulmonary/Chest: Effort normal and breath sounds normal.   Musculoskeletal: Normal range of motion.  Neurological: He is alert and oriented to person, place, and time.  Skin: No erythema.  Psychiatric: He has a normal mood and affect. His behavior is normal. Judgment and thought content normal.    Results for orders placed or performed during the hospital encounter of 03/07/17  I-STAT creatinine  Result Value Ref Range   Creatinine, Ser 1.00 0.61 - 1.24 mg/dL      Assessment & Plan:   Problem List Items Addressed This Visit      Respiratory   Acute sinusitis    Discussed sinusitis care and treatment use of Z-Pak Mucinex Tylenol sinus etc.      Relevant Medications   azithromycin (ZITHROMAX) 250 MG tablet     Endocrine   Diabetes mellitus without complication (HCC) - Primary    The current medical regimen is effective;  continue present plan and medications.       Relevant Orders   Basic metabolic panel   Bayer DCA Hb K4M Waived   LP+ALT+AST Piccolo, Waived     Other   Hyperlipemia    The current medical regimen is effective;  continue present plan and medications.       Relevant Orders   Basic metabolic panel   Bayer DCA Hb W1U Waived   LP+ALT+AST Piccolo, [place]    Other Visit Diagnoses    Medication management  Relevant Orders   Basic metabolic panel       Follow up plan: Return in about 3 months (around 06/18/2017) for Hemoglobin A1c.

## 2017-03-18 NOTE — Assessment & Plan Note (Signed)
Discussed sinusitis care and treatment use of Z-Pak Mucinex Tylenol sinus etc.

## 2017-03-19 ENCOUNTER — Encounter: Payer: Self-pay | Admitting: Family Medicine

## 2017-03-19 LAB — LP+ALT+AST PICCOLO, WAIVED
ALT (SGPT) Piccolo, Waived: 19 U/L (ref 10–47)
AST (SGOT) Piccolo, Waived: 24 U/L (ref 11–38)
CHOL/HDL RATIO PICCOLO,WAIVE: 3.1 mg/dL
CHOLESTEROL PICCOLO, WAIVED: 134 mg/dL (ref <200)
HDL Chol Piccolo, Waived: 43 mg/dL — ABNORMAL LOW (ref >59)
LDL CHOL CALC PICCOLO WAIVED: 64 mg/dL (ref <100)
Triglycerides Piccolo,Waived: 139 mg/dL (ref <150)
VLDL CHOL CALC PICCOLO,WAIVE: 28 mg/dL (ref <30)

## 2017-03-19 LAB — BASIC METABOLIC PANEL
BUN / CREAT RATIO: 18 (ref 10–24)
BUN: 18 mg/dL (ref 8–27)
CALCIUM: 9.2 mg/dL (ref 8.6–10.2)
CHLORIDE: 103 mmol/L (ref 96–106)
CO2: 25 mmol/L (ref 18–29)
Creatinine, Ser: 1.01 mg/dL (ref 0.76–1.27)
GFR calc non Af Amer: 79 mL/min/{1.73_m2} (ref >59)
GFR, EST AFRICAN AMERICAN: 91 mL/min/{1.73_m2} (ref >59)
GLUCOSE: 99 mg/dL (ref 65–99)
POTASSIUM: 4 mmol/L (ref 3.5–5.2)
Sodium: 140 mmol/L (ref 134–144)

## 2017-03-19 LAB — BAYER DCA HB A1C WAIVED: HB A1C: 6.4 % (ref <7.0)

## 2017-04-14 ENCOUNTER — Other Ambulatory Visit: Payer: Self-pay | Admitting: Family Medicine

## 2017-04-14 DIAGNOSIS — F5102 Adjustment insomnia: Secondary | ICD-10-CM

## 2017-04-16 ENCOUNTER — Other Ambulatory Visit: Payer: Self-pay | Admitting: Family Medicine

## 2017-04-16 MED ORDER — AZITHROMYCIN 250 MG PO TABS: ORAL_TABLET | ORAL | 0 refills | Status: DC

## 2017-06-29 ENCOUNTER — Encounter: Payer: Self-pay | Admitting: Family Medicine

## 2017-06-29 ENCOUNTER — Ambulatory Visit (INDEPENDENT_AMBULATORY_CARE_PROVIDER_SITE_OTHER): Payer: Commercial Managed Care - PPO | Admitting: Family Medicine

## 2017-06-29 ENCOUNTER — Other Ambulatory Visit: Payer: Self-pay | Admitting: Family Medicine

## 2017-06-29 VITALS — BP 93/56 | HR 74 | Wt 175.0 lb

## 2017-06-29 DIAGNOSIS — E119 Type 2 diabetes mellitus without complications: Secondary | ICD-10-CM | POA: Diagnosis not present

## 2017-06-29 DIAGNOSIS — F5102 Adjustment insomnia: Secondary | ICD-10-CM | POA: Diagnosis not present

## 2017-06-29 DIAGNOSIS — E7800 Pure hypercholesterolemia, unspecified: Secondary | ICD-10-CM

## 2017-06-29 DIAGNOSIS — Z23 Encounter for immunization: Secondary | ICD-10-CM | POA: Diagnosis not present

## 2017-06-29 NOTE — Assessment & Plan Note (Signed)
discuss

## 2017-06-29 NOTE — Assessment & Plan Note (Signed)
The current medical regimen is effective;  continue present plan and medications.  

## 2017-06-29 NOTE — Progress Notes (Signed)
BP (!) 93/56   Pulse 74   Wt 175 lb (79.4 kg)   SpO2 95%   BMI 24.07 kg/m    Subjective:    Patient ID: [name], male    DOB: 1955/11/22, 61 y.o.   MRN: [id number]  HPI: [name] is a 61 y.o. male  Chief Complaint  Patient presents with  . Diabetes  Patient follow-up diabetes is gotten worse with great deal of family stress going on. Patient's had trouble with control as takes medications faithfully but stress is been really taken a big toll. Blood pressure has actually been on the low side no lightheaded or other type symptoms. No low blood sugar spells Has been using clonazepam very rarely.  Relevant past medical, surgical, family and social history reviewed and updated as indicated. Interim medical history since our last visit reviewed. Allergies and medications reviewed and updated.  Review of Systems  Constitutional: Negative.   Respiratory: Negative.   Cardiovascular: Negative.     Per HPI unless specifically indicated above     Objective:    BP (!) 93/56   Pulse 74   Wt 175 lb (79.4 kg)   SpO2 95%   BMI 24.07 kg/m   Wt Readings from Last 3 Encounters:  06/29/17 175 lb (79.4 kg)  03/18/17 175 lb (79.4 kg)  11/20/16 169 lb 3.2 oz (76.7 kg)    Physical Exam  Constitutional: He is oriented to person, place, and time. He appears well-developed and well-nourished.  HENT:  Head: Normocephalic and atraumatic.  Eyes: Conjunctivae and EOM are normal.  Neck: Normal range of motion.  Cardiovascular: Normal rate, regular rhythm and normal heart sounds.   Pulmonary/Chest: Effort normal and breath sounds normal.  Musculoskeletal: Normal range of motion.  Neurological: He is alert and oriented to person, place, and time.  Skin: No erythema.  Psychiatric: He has a normal mood and affect. His behavior is normal. Judgment and thought content normal.    Results for orders placed or performed in visit on 03/18/17  Basic metabolic panel  Result Value Ref  Range   Glucose 99 65 - 99 mg/dL   BUN 18 8 - 27 mg/dL   Creatinine, Ser 8.65 0.76 - 1.27 mg/dL   GFR calc non Af Amer 79 >59 mL/min/1.73   GFR calc Af Amer 91 >59 mL/min/1.73   BUN/Creatinine Ratio 18 10 - 24   Sodium 140 134 - 144 mmol/L   Potassium 4.0 3.5 - 5.2 mmol/L   Chloride 103 96 - 106 mmol/L   CO2 25 18 - 29 mmol/L   Calcium 9.2 8.6 - 10.2 mg/dL  Bayer DCA Hb H8I Waived  Result Value Ref Range   Bayer DCA Hb A1c Waived 6.4 <7.0 %  LP+ALT+AST Piccolo, Waived  Result Value Ref Range   ALT (SGPT) Piccolo, Waived 19 10 - 47 U/L   AST (SGOT) Piccolo, Waived 24 11 - 38 U/L   Cholesterol Piccolo, Waived 134 <200 mg/dL   HDL Chol Piccolo, Waived 43 (L) >59 mg/dL   Triglycerides Piccolo,Waived 139 <150 mg/dL   Chol/HDL Ratio Piccolo,Waive 3.1 mg/dL   LDL Chol Calc Piccolo Waived 64 <100 mg/dL   VLDL Chol Calc Piccolo,Waive 28 <30 mg/dL      Assessment & Plan:   Problem List Items Addressed This Visit      Endocrine   Diabetes mellitus without complication (HCC) - Primary    The current medical regimen is effective;  continue present plan  and medications.       Relevant Orders   Bayer DCA Hb A1c Waived     Other   Hyperlipemia    The current medical regimen is effective;  continue present plan and medications.       Relevant Orders   Bayer DCA Hb A1c Waived   Insomnia due to stress    discuss       Other Visit Diagnoses    Needs flu shot       Relevant Orders   Flu Vaccine QUAD 6+ mos PF IM (Fluarix Quad PF) (Completed)       Follow up plan: Return in about 4 weeks (around 07/27/2017) for Physical Exam.

## 2017-06-30 LAB — BAYER DCA HB A1C WAIVED: HB A1C: 7 % — AB (ref <7.0)

## 2017-08-05 ENCOUNTER — Ambulatory Visit: Payer: Commercial Managed Care - PPO | Admitting: Family Medicine

## 2017-08-24 ENCOUNTER — Other Ambulatory Visit: Payer: Self-pay | Admitting: Family Medicine

## 2017-09-14 ENCOUNTER — Telehealth: Payer: Self-pay | Admitting: Family Medicine

## 2017-09-14 NOTE — Telephone Encounter (Signed)
RX form filled out. Will have provider sign first thing in the morning and fax to the pharmacy.

## 2017-09-14 NOTE — Telephone Encounter (Signed)
Copied from CRM [id number]. Topic: General - Other >> Sep 14, 2017  2:09 PM [name], Cheryl W wrote: Reason for CRM:  pt insurance will no longer cover his current meter. They will cover ACCUCHECK AND HE WILL ALSO NEED TEST STRIPS. HE TEST TWICE A DAY         PHARMACY [facility] [place]

## 2017-09-15 NOTE — Telephone Encounter (Signed)
Called and let patient's wife know that I was [facility] [place].

## 2017-09-21 ENCOUNTER — Telehealth: Payer: Self-pay | Admitting: Family Medicine

## 2017-09-21 DIAGNOSIS — E119 Type 2 diabetes mellitus without complications: Secondary | ICD-10-CM

## 2017-09-21 NOTE — Telephone Encounter (Signed)
Copied from CRM [id number]. Topic: Inquiry >> Sep 21, 2017  3:44 PM Stephannie LiSimmons, Siraj Dermody L, NT wrote: Reason for CRM: Patients wife called and said [facility] [place] [facility] prescription for acu chek ,and test strips for the meter please advise [phone]

## 2017-09-22 MED ORDER — ACCU-CHEK AVIVA PLUS W/DEVICE KIT: PACK | 0 refills | Status: AC

## 2017-09-22 NOTE — Telephone Encounter (Signed)
Sent to pharmacy 

## 2017-09-23 ENCOUNTER — Other Ambulatory Visit: Payer: Self-pay | Admitting: Family Medicine

## 2017-09-23 DIAGNOSIS — E119 Type 2 diabetes mellitus without complications: Secondary | ICD-10-CM

## 2017-09-24 ENCOUNTER — Encounter: Payer: Self-pay | Admitting: Family Medicine

## 2017-09-24 ENCOUNTER — Ambulatory Visit: Payer: Commercial Managed Care - PPO | Admitting: Family Medicine

## 2017-09-24 VITALS — BP 113/66 | HR 64 | Wt 171.0 lb

## 2017-09-24 DIAGNOSIS — E119 Type 2 diabetes mellitus without complications: Secondary | ICD-10-CM

## 2017-09-24 DIAGNOSIS — E7800 Pure hypercholesterolemia, unspecified: Secondary | ICD-10-CM

## 2017-09-24 DIAGNOSIS — F5102 Adjustment insomnia: Secondary | ICD-10-CM | POA: Diagnosis not present

## 2017-09-24 LAB — BAYER DCA HB A1C WAIVED: HB A1C (BAYER DCA - WAIVED): 6.8 % (ref <7.0)

## 2017-09-24 MED ORDER — DULAGLUTIDE 0.75 MG/0.5ML ~~LOC~~ SOAJ: SUBCUTANEOUS | 4 refills | Status: DC

## 2017-09-24 MED ORDER — PANTOPRAZOLE SODIUM 40 MG PO TBEC: 40.0000 mg | DELAYED_RELEASE_TABLET | Freq: Every day | ORAL | 3 refills | Status: DC

## 2017-09-24 MED ORDER — METFORMIN HCL 500 MG PO TABS: 1000.0000 mg | ORAL_TABLET | Freq: Two times a day (BID) | ORAL | 4 refills | Status: DC

## 2017-09-24 MED ORDER — DAPAGLIFLOZIN PROPANEDIOL 10 MG PO TABS: 10.0000 mg | ORAL_TABLET | Freq: Every day | ORAL | 4 refills | Status: DC

## 2017-09-24 MED ORDER — NAPROXEN 500 MG PO TABS: 500.0000 mg | ORAL_TABLET | Freq: Every day | ORAL | 1 refills | Status: DC | PRN

## 2017-09-24 MED ORDER — ATORVASTATIN CALCIUM 40 MG PO TABS: 40.0000 mg | ORAL_TABLET | Freq: Every day | ORAL | 4 refills | Status: DC

## 2017-09-24 MED ORDER — CLONAZEPAM 1 MG PO TABS: 1.0000 mg | ORAL_TABLET | Freq: Every day | ORAL | 1 refills | Status: DC | PRN

## 2017-09-24 MED ORDER — SUCRALFATE 1 G PO TABS: 1.0000 g | ORAL_TABLET | Freq: Two times a day (BID) | ORAL | 4 refills | Status: DC

## 2017-09-24 NOTE — Assessment & Plan Note (Signed)
The current medical regimen is effective;  continue present plan and medications.  

## 2017-09-24 NOTE — Progress Notes (Signed)
   BP 113/66   Pulse 64   Wt 171 lb (77.6 kg)   SpO2 95%   BMI 23.52 kg/m    Subjective:    Patient ID: [name], male    DOB: 1956/04/09, 61 y.o.   MRN: [id number]  HPI: [name] is a 61 y.o. male  Chief Complaint  Patient presents with  . Follow-up  . Diabetes   Diabetes recheck doing well noted low blood sugar spells issues with medications taking medications faithfully without problemscholesterol doing well without problemsClonazepam for occasional anxiety doing okay patient still under great deal of stress especially througwork  But limiting use of clonazepam.. As tried Protonix which seems to help better than Nexium wants prescription for that. Takes Naprosyn from time to time pre-much every other day and that does okay.  Relevant past medical, surgical, family and social history reviewed and updated as indicated. Interim medical history since our last visit reviewed. Allergies and medications reviewed and updated.  Review of Systems  Constitutional: Negative.   Respiratory: Negative.   Cardiovascular: Negative.     Per HPI unless specifically indicated above     Objective:    BP 113/66   Pulse 64   Wt 171 lb (77.6 kg)   SpO2 95%   BMI 23.52 kg/m   Wt Readings from Last 3 Encounters:  09/24/17 171 lb (77.6 kg)  06/29/17 175 lb (79.4 kg)  03/18/17 175 lb (79.4 kg)    Physical Exam  Constitutional: He is oriented to person, place, and time. He appears well-developed and well-nourished.  HENT:  Head: Normocephalic and atraumatic.  Eyes: Conjunctivae and EOM are normal.  Neck: Normal range of motion.  Cardiovascular: Normal rate, regular rhythm and normal heart sounds.  Pulmonary/Chest: Effort normal and breath sounds normal.  Musculoskeletal: Normal range of motion.  Neurological: He is alert and oriented to person, place, and time.  Skin: No erythema.  Psychiatric: He has a normal mood and affect. His behavior is normal. Judgment and thought  content normal.    Results for orders placed or performed in visit on 06/29/17  Bayer DCA Hb A1c Waived  Result Value Ref Range   Bayer DCA Hb A1c Waived 7.0 (H) <7.0 %      Assessment & Plan:   Problem List Items Addressed This Visit      Endocrine   Diabetes mellitus without complication (HCC) - Primary    The current medical regimen is effective;  continue present plan and medications.       Relevant Medications   Dulaglutide (TRULICITY) 0.75 MG/0.5ML SOPN   dapagliflozin propanediol (FARXIGA) 10 MG TABS tablet   atorvastatin (LIPITOR) 40 MG tablet   metFORMIN (GLUCOPHAGE) 500 MG tablet   Other Relevant Orders   Bayer DCA Hb A1c Waived     Other   Hyperlipemia    The current medical regimen is effective;  continue present plan and medications.       Relevant Medications   atorvastatin (LIPITOR) 40 MG tablet   Other Relevant Orders   Bayer DCA Hb A1c Waived   Insomnia due to stress    The current medical regimen is effective;  continue present plan and medications.       Relevant Medications   clonazePAM (KLONOPIN) 1 MG tablet       Follow up plan: Return in about 3 months (around 12/23/2017) for Physical Exam , Hemoglobin A1c.

## 2017-09-24 NOTE — Telephone Encounter (Signed)
Wasn't sure how many to prescribe

## 2017-09-29 ENCOUNTER — Telehealth: Payer: Self-pay | Admitting: Family Medicine

## 2017-09-29 NOTE — Telephone Encounter (Signed)
Copied from CRM [id number]. Topic: Quick Communication - See Telephone Encounter >> Sep 29, 2017  1:48 PM Louie BunPalacios Medina, Rosey Batheresa D wrote: CRM for notification. See Telephone encounter for: 09/29/17. Patient called and said that he is not feeling well and has the same symptoms his wife had last week and wants to know if Dr. Dossie Arbourrissman would call him in a zpack. [id number] [place], [place] - [address]

## 2017-09-30 ENCOUNTER — Telehealth: Payer: Self-pay

## 2017-10-01 MED ORDER — AZITHROMYCIN 250 MG PO TABS: ORAL_TABLET | ORAL | 0 refills | Status: DC

## 2017-12-22 ENCOUNTER — Ambulatory Visit: Payer: Commercial Managed Care - PPO | Admitting: Family Medicine

## 2018-01-06 ENCOUNTER — Encounter: Payer: Self-pay | Admitting: Family Medicine

## 2018-01-06 ENCOUNTER — Ambulatory Visit (INDEPENDENT_AMBULATORY_CARE_PROVIDER_SITE_OTHER): Payer: Commercial Managed Care - PPO | Admitting: Family Medicine

## 2018-01-06 VITALS — BP 106/64 | HR 97 | Wt 173.0 lb

## 2018-01-06 DIAGNOSIS — E119 Type 2 diabetes mellitus without complications: Secondary | ICD-10-CM

## 2018-01-06 DIAGNOSIS — Z125 Encounter for screening for malignant neoplasm of prostate: Secondary | ICD-10-CM | POA: Diagnosis not present

## 2018-01-06 DIAGNOSIS — Z1329 Encounter for screening for other suspected endocrine disorder: Secondary | ICD-10-CM | POA: Diagnosis not present

## 2018-01-06 DIAGNOSIS — Z1211 Encounter for screening for malignant neoplasm of colon: Secondary | ICD-10-CM

## 2018-01-06 DIAGNOSIS — E7800 Pure hypercholesterolemia, unspecified: Secondary | ICD-10-CM

## 2018-01-06 LAB — URINALYSIS, ROUTINE W REFLEX MICROSCOPIC
Bilirubin, UA: NEGATIVE
Ketones, UA: NEGATIVE
LEUKOCYTES UA: NEGATIVE
Nitrite, UA: NEGATIVE
PH UA: 6 (ref 5.0–7.5)
Protein, UA: NEGATIVE
RBC, UA: NEGATIVE
Specific Gravity, UA: 1.02 (ref 1.005–1.030)
Urobilinogen, Ur: 0.2 mg/dL (ref 0.2–1.0)

## 2018-01-06 LAB — BAYER DCA HB A1C WAIVED: HB A1C (BAYER DCA - WAIVED): 6.8 % (ref <7.0)

## 2018-01-06 MED ORDER — PANTOPRAZOLE SODIUM 40 MG PO TBEC
40.0000 mg | DELAYED_RELEASE_TABLET | Freq: Every day | ORAL | 4 refills | Status: DC
Start: 2018-01-06 — End: 2019-01-13

## 2018-01-06 MED ORDER — DAPAGLIFLOZIN PROPANEDIOL 10 MG PO TABS: 10.0000 mg | ORAL_TABLET | Freq: Every day | ORAL | 4 refills | Status: DC

## 2018-01-06 MED ORDER — METFORMIN HCL 500 MG PO TABS: 1000.0000 mg | ORAL_TABLET | Freq: Two times a day (BID) | ORAL | 4 refills | Status: DC

## 2018-01-06 MED ORDER — ATORVASTATIN CALCIUM 40 MG PO TABS: 40.0000 mg | ORAL_TABLET | Freq: Every day | ORAL | 4 refills | Status: DC

## 2018-01-06 MED ORDER — DULAGLUTIDE 0.75 MG/0.5ML ~~LOC~~ SOAJ: SUBCUTANEOUS | 4 refills | Status: DC

## 2018-01-06 MED ORDER — SUCRALFATE 1 G PO TABS: 1.0000 g | ORAL_TABLET | Freq: Two times a day (BID) | ORAL | 4 refills | Status: DC

## 2018-01-06 NOTE — Progress Notes (Signed)
BP 106/64   Pulse 97   Wt 173 lb (78.5 kg)   SpO2 97%   BMI 23.79 kg/m    Subjective:    Patient ID: [name], male    DOB: Jan 17, 1956, 62 y.o.   MRN: [id number]  HPI: [name] is a 62 y.o. male  Chief Complaint  Patient presents with  . Annual Exam  With multiple medical issues stable with stable diabetes no low blood sugar spells blood pressure good control arthritis does okay at best along with cholesterol that is okay.  Patient with a lot of stress at work. Patient's left knee takes arthritis medicine for either Naprosyn has ability to take some meloxicam which is okay patient got an injection in his knee. Relevant past medical, surgical, family and social history reviewed and updated as indicated. Interim medical history since our last visit reviewed. Allergies and medications reviewed and updated.  Review of Systems  Constitutional: Negative.   HENT: Negative.   Eyes: Negative.   Respiratory: Negative.   Cardiovascular: Negative.   Gastrointestinal: Negative.   Endocrine: Negative.   Genitourinary: Negative.        Also having some hemorrhoid flareup with some bright red rectal bleeding with stools  Musculoskeletal: Negative.   Skin: Negative.   Allergic/Immunologic: Negative.   Neurological: Negative.   Hematological: Negative.   Psychiatric/Behavioral: Negative.     Per HPI unless specifically indicated above     Objective:    BP 106/64   Pulse 97   Wt 173 lb (78.5 kg)   SpO2 97%   BMI 23.79 kg/m   Wt Readings from Last 3 Encounters:  01/06/18 173 lb (78.5 kg)  09/24/17 171 lb (77.6 kg)  06/29/17 175 lb (79.4 kg)    Physical Exam  Constitutional: He is oriented to person, place, and time. He appears well-developed and well-nourished.  HENT:  Head: Normocephalic and atraumatic.  Right Ear: External ear normal.  Left Ear: External ear normal.  Eyes: Conjunctivae and EOM are normal. Pupils are equal, round, and reactive to light.    Neck: Normal range of motion. Neck supple.  Cardiovascular: Normal rate, regular rhythm, normal heart sounds and intact distal pulses.  Pulmonary/Chest: Effort normal and breath sounds normal.  Abdominal: Soft. Bowel sounds are normal. There is no splenomegaly or hepatomegaly.  Genitourinary: Rectum normal, prostate normal and penis normal.  Genitourinary Comments: Internal hemorrhoid  Musculoskeletal: Normal range of motion.  Neurological: He is alert and oriented to person, place, and time. He has normal reflexes.  Skin: No rash noted. No erythema.  Psychiatric: He has a normal mood and affect. His behavior is normal. Judgment and thought content normal.    Results for orders placed or performed in visit on 09/24/17  Bayer DCA Hb A1c Waived  Result Value Ref Range   Bayer DCA Hb A1c Waived 6.8 <7.0 %      Assessment & Plan:   Problem List Items Addressed This Visit      Endocrine   Diabetes mellitus without complication (HCC) - Primary    .The current medical regimen is effective;  continue present plan and medications.       Relevant Medications   metFORMIN (GLUCOPHAGE) 500 MG tablet   Dulaglutide (TRULICITY) 0.75 MG/0.5ML SOPN   dapagliflozin propanediol (FARXIGA) 10 MG TABS tablet   atorvastatin (LIPITOR) 40 MG tablet   Other Relevant Orders   Bayer DCA Hb A1c Waived   CBC with Differential/Platelet   Comprehensive metabolic  panel   Lipid panel   Urinalysis, Routine w reflex microscopic   Microalbumin, Urine Waived     Other   Hyperlipemia    The current medical regimen is effective;  continue present plan and medications.       Relevant Medications   atorvastatin (LIPITOR) 40 MG tablet   Other Relevant Orders   Bayer DCA Hb A1c Waived   CBC with Differential/Platelet   Comprehensive metabolic panel   Lipid panel   Urinalysis, Routine w reflex microscopic   Microalbumin, Urine Waived    Other Visit Diagnoses    Thyroid disorder screen       Relevant  Orders   TSH   Prostate cancer screening       Relevant Orders   PSA   Encounter for screening colonoscopy       Relevant Orders   Ambulatory referral to Gastroenterology       Follow up plan: Return in about 6 months (around 07/09/2018) for BMP,  Lipids, ALT, AST.

## 2018-01-06 NOTE — Assessment & Plan Note (Signed)
The current medical regimen is effective;  continue present plan and medications.  

## 2018-01-07 ENCOUNTER — Encounter: Payer: Self-pay | Admitting: Family Medicine

## 2018-01-07 ENCOUNTER — Telehealth: Payer: Self-pay

## 2018-01-07 LAB — COMPREHENSIVE METABOLIC PANEL
A/G RATIO: 2 (ref 1.2–2.2)
ALT: 20 IU/L (ref 0–44)
AST: 18 IU/L (ref 0–40)
Albumin: 4.5 g/dL (ref 3.6–4.8)
Alkaline Phosphatase: 68 IU/L (ref 39–117)
BILIRUBIN TOTAL: 0.3 mg/dL (ref 0.0–1.2)
BUN / CREAT RATIO: 24 (ref 10–24)
BUN: 23 mg/dL (ref 8–27)
CALCIUM: 9.7 mg/dL (ref 8.6–10.2)
CO2: 25 mmol/L (ref 20–29)
Chloride: 102 mmol/L (ref 96–106)
Creatinine, Ser: 0.95 mg/dL (ref 0.76–1.27)
GFR, EST AFRICAN AMERICAN: 97 mL/min/{1.73_m2} (ref >59)
GFR, EST NON AFRICAN AMERICAN: 84 mL/min/{1.73_m2} (ref >59)
GLOBULIN, TOTAL: 2.3 g/dL (ref 1.5–4.5)
Glucose: 141 mg/dL — ABNORMAL HIGH (ref 65–99)
Potassium: 3.9 mmol/L (ref 3.5–5.2)
SODIUM: 141 mmol/L (ref 134–144)
TOTAL PROTEIN: 6.8 g/dL (ref 6.0–8.5)

## 2018-01-07 LAB — CBC WITH DIFFERENTIAL/PLATELET
Basophils Absolute: 0 10*3/uL (ref 0.0–0.2)
Basos: 0 %
EOS (ABSOLUTE): 0.2 10*3/uL (ref 0.0–0.4)
EOS: 3 %
HEMATOCRIT: 44.5 % (ref 37.5–51.0)
Hemoglobin: 14.8 g/dL (ref 13.0–17.7)
IMMATURE GRANS (ABS): 0 10*3/uL (ref 0.0–0.1)
IMMATURE GRANULOCYTES: 0 %
LYMPHS: 38 %
Lymphocytes Absolute: 2.1 10*3/uL (ref 0.7–3.1)
MCH: 31 pg (ref 26.6–33.0)
MCHC: 33.3 g/dL (ref 31.5–35.7)
MCV: 93 fL (ref 79–97)
MONOS ABS: 0.7 10*3/uL (ref 0.1–0.9)
Monocytes: 14 %
NEUTROS ABS: 2.4 10*3/uL (ref 1.4–7.0)
NEUTROS PCT: 45 %
Platelets: 189 10*3/uL (ref 150–379)
RBC: 4.78 x10E6/uL (ref 4.14–5.80)
RDW: 14.1 % (ref 12.3–15.4)
WBC: 5.4 10*3/uL (ref 3.4–10.8)

## 2018-01-07 LAB — LIPID PANEL
CHOL/HDL RATIO: 3.7 ratio (ref 0.0–5.0)
Cholesterol, Total: 181 mg/dL (ref 100–199)
HDL: 49 mg/dL (ref >39)
LDL Calculated: 92 mg/dL (ref 0–99)
Triglycerides: 202 mg/dL — ABNORMAL HIGH (ref 0–149)
VLDL Cholesterol Cal: 40 mg/dL (ref 5–40)

## 2018-01-07 LAB — PSA: PROSTATE SPECIFIC AG, SERUM: 1.8 ng/mL (ref 0.0–4.0)

## 2018-01-07 LAB — TSH: TSH: 2.09 u[IU]/mL (ref 0.450–4.500)

## 2018-01-07 NOTE — Telephone Encounter (Signed)
Trulicity was approved 11/30/17 - 12/29/20

## 2018-02-11 LAB — HM DIABETES EYE EXAM

## 2018-03-19 ENCOUNTER — Encounter: Payer: Self-pay | Admitting: Family Medicine

## 2018-04-08 ENCOUNTER — Ambulatory Visit: Payer: Commercial Managed Care - PPO | Admitting: Family Medicine

## 2018-04-26 ENCOUNTER — Ambulatory Visit: Payer: Commercial Managed Care - PPO | Admitting: Family Medicine

## 2018-04-26 ENCOUNTER — Encounter: Payer: Self-pay | Admitting: Family Medicine

## 2018-04-26 ENCOUNTER — Ambulatory Visit (INDEPENDENT_AMBULATORY_CARE_PROVIDER_SITE_OTHER): Payer: Commercial Managed Care - PPO | Admitting: Family Medicine

## 2018-04-26 VITALS — BP 119/62 | HR 66 | Ht 72.0 in | Wt 170.0 lb

## 2018-04-26 DIAGNOSIS — F5102 Adjustment insomnia: Secondary | ICD-10-CM

## 2018-04-26 DIAGNOSIS — Z566 Other physical and mental strain related to work: Secondary | ICD-10-CM

## 2018-04-26 DIAGNOSIS — E7800 Pure hypercholesterolemia, unspecified: Secondary | ICD-10-CM

## 2018-04-26 DIAGNOSIS — E119 Type 2 diabetes mellitus without complications: Secondary | ICD-10-CM

## 2018-04-26 LAB — MICROALBUMIN, URINE WAIVED
Creatinine, Urine Waived: 100 mg/dL (ref 10–300)
MICROALB, UR WAIVED: 10 mg/L (ref 0–19)
Microalb/Creat Ratio: <30 mg/g (ref <30)

## 2018-04-26 LAB — LP+ALT+AST PICCOLO, WAIVED
ALT (SGPT) PICCOLO, WAIVED: 30 U/L (ref 10–47)
AST (SGOT) Piccolo, Waived: 27 U/L (ref 11–38)
CHOL/HDL RATIO PICCOLO,WAIVE: 3.3 mg/dL
Cholesterol Piccolo, Waived: 157 mg/dL (ref <200)
HDL Chol Piccolo, Waived: 47 mg/dL — ABNORMAL LOW (ref >59)
LDL Chol Calc Piccolo Waived: 77 mg/dL (ref <100)
TRIGLYCERIDES PICCOLO,WAIVED: 162 mg/dL — AB (ref <150)
VLDL Chol Calc Piccolo,Waive: 32 mg/dL — ABNORMAL HIGH (ref <30)

## 2018-04-26 MED ORDER — CLONAZEPAM 1 MG PO TABS: 1.0000 mg | ORAL_TABLET | Freq: Every day | ORAL | 1 refills | Status: DC | PRN

## 2018-04-26 NOTE — Addendum Note (Signed)
Addended by: Vonita MossRISSMAN, Palmina Clodfelter A on: 04/26/2018 04:54 PM   Modules accepted: Orders

## 2018-04-26 NOTE — Assessment & Plan Note (Signed)
The current medical regimen is effective;  continue present plan and medications.  

## 2018-04-26 NOTE — Assessment & Plan Note (Signed)
Discussed stress and anxiety we will continue clonazepam limited usage.

## 2018-04-26 NOTE — Progress Notes (Signed)
   BP 119/62   Pulse 66   Ht 6' (1.829 m)   Wt 170 lb (77.1 kg)   SpO2 98%   BMI 23.06 kg/m    Subjective:    Patient ID: [name], male    DOB: [birth year], 62 y.o.   MRN: [id number]  HPI: [name] is a 62 y.o. male  Chief Complaint  Patient presents with  . Follow-up  . Hypertension  . Diabetes  Patient follow-up diabetes doing well no complaints no low blood sugar spells stop for [employer] for couple weeks but restarted his blood sugar was going back up taking other medications faithfully without problems or issues. Reflux doing well on medications. Clonazepam taking for work stress was taking whole tablet almost every work day but now down to half a tablet and not every work day. Cholesterol doing okay.   Relevant past medical, surgical, family and social history reviewed and updated as indicated. Interim medical history since our last visit reviewed. Allergies and medications reviewed and updated.  Review of Systems  Constitutional: Negative.   Respiratory: Negative.   Cardiovascular: Negative.     Per HPI unless specifically indicated above     Objective:    BP 119/62   Pulse 66   Ht 6' (1.829 m)   Wt 170 lb (77.1 kg)   SpO2 98%   BMI 23.06 kg/m   Wt Readings from Last 3 Encounters:  04/26/18 170 lb (77.1 kg)  01/06/18 173 lb (78.5 kg)  09/24/17 171 lb (77.6 kg)    Physical Exam  Constitutional: He is oriented to person, place, and time. He appears well-developed and well-nourished.  HENT:  Head: Normocephalic and atraumatic.  Eyes: Conjunctivae and EOM are normal.  Neck: Normal range of motion.  Cardiovascular: Normal rate, regular rhythm and normal heart sounds.  Pulmonary/Chest: Effort normal and breath sounds normal.  Musculoskeletal: Normal range of motion.  Neurological: He is alert and oriented to person, place, and time.  Skin: No erythema.  Psychiatric: He has a normal mood and affect. His behavior is normal. Judgment and thought  content normal.    Results for orders placed or performed in visit on 02/16/18  HM DIABETES EYE EXAM  Result Value Ref Range   HM Diabetic Eye Exam No Retinopathy No Retinopathy      Assessment & Plan:   Problem List Items Addressed This Visit      Endocrine   Diabetes mellitus without complication (HCC) - Primary    The current medical regimen is effective;  continue present plan and medications.       Relevant Orders   Basic metabolic panel   LP+ALT+AST Piccolo, Waived   Microalbumin, Urine Waived     Other   Hyperlipemia    The current medical regimen is effective;  continue present plan and medications.       Relevant Orders   Basic metabolic panel   LP+ALT+AST Piccolo, Waived   Microalbumin, Urine Waived   Insomnia due to stress   Relevant Medications   clonazePAM (KLONOPIN) 1 MG tablet   Stress at work    Discussed stress and anxiety we will continue clonazepam limited usage.          Follow up plan: Return in about 4 months (around 08/27/2018) for Hemoglobin A1c, BMP,  Lipids, ALT, AST.

## 2018-04-27 ENCOUNTER — Encounter: Payer: Self-pay | Admitting: Family Medicine

## 2018-04-27 LAB — BASIC METABOLIC PANEL
BUN/Creatinine Ratio: 23 (ref 10–24)
BUN: 21 mg/dL (ref 8–27)
CHLORIDE: 101 mmol/L (ref 96–106)
CO2: 23 mmol/L (ref 20–29)
Calcium: 10.1 mg/dL (ref 8.6–10.2)
Creatinine, Ser: 0.9 mg/dL (ref 0.76–1.27)
GFR calc Af Amer: 104 mL/min/{1.73_m2} (ref >59)
GFR, EST NON AFRICAN AMERICAN: 90 mL/min/{1.73_m2} (ref >59)
GLUCOSE: 113 mg/dL — AB (ref 65–99)
POTASSIUM: 4.2 mmol/L (ref 3.5–5.2)
SODIUM: 141 mmol/L (ref 134–144)

## 2018-04-27 LAB — BAYER DCA HB A1C WAIVED: HB A1C (BAYER DCA - WAIVED): 6.7 % (ref <7.0)

## 2018-04-28 ENCOUNTER — Ambulatory Visit: Payer: Commercial Managed Care - PPO | Admitting: Family Medicine

## 2018-05-20 ENCOUNTER — Telehealth: Payer: Self-pay

## 2018-05-20 MED ORDER — NAPROXEN 500 MG PO TABS: 500.0000 mg | ORAL_TABLET | Freq: Two times a day (BID) | ORAL | 1 refills | Status: DC

## 2018-05-20 NOTE — Telephone Encounter (Signed)
Received fax from [facility] stating, "patient requests new RX: Patient needs new RX for naproxen, patient is saying the dose was upped to taking 2 tablets a day instead of one." Please advise.

## 2018-06-30 NOTE — Telephone Encounter (Signed)
Opened in error

## 2018-07-22 ENCOUNTER — Telehealth: Payer: Self-pay | Admitting: Family Medicine

## 2018-07-22 MED ORDER — AZITHROMYCIN 250 MG PO TABS
ORAL_TABLET | ORAL | 0 refills | Status: DC
Start: 2018-07-22 — End: 2018-09-06

## 2018-07-22 NOTE — Telephone Encounter (Signed)
Copied from CRM [id number]. Topic: Quick Communication - Rx Refill/Question >> Jul 22, 2018  9:16 AM Baldo Daub L wrote: Medication:  Z-Pack  Pt's wife calling in.  States that pt needs a Z-Pack called in because he is having cough, runny nose, ears hurt. Wife states that normally the physician will call something in for him with seeing him in the office.  Wife states pt needs something today because he is supposed to be leaving for the beach on Monday.  Preferred Pharmacy (with phone number or street name): [id number] [place], [place] - [address] [phone] (Phone) [phone] (Fax)  Agent: Please be advised that RX refills may take up to 3 business days. We ask that you follow-up with your pharmacy.

## 2018-09-06 ENCOUNTER — Ambulatory Visit: Payer: Commercial Managed Care - PPO | Admitting: Family Medicine

## 2018-09-06 ENCOUNTER — Encounter: Payer: Self-pay | Admitting: Family Medicine

## 2018-09-06 VITALS — BP 122/73 | HR 64 | Wt 174.0 lb

## 2018-09-06 DIAGNOSIS — Z23 Encounter for immunization: Secondary | ICD-10-CM | POA: Diagnosis not present

## 2018-09-06 DIAGNOSIS — E119 Type 2 diabetes mellitus without complications: Secondary | ICD-10-CM | POA: Diagnosis not present

## 2018-09-06 DIAGNOSIS — E7800 Pure hypercholesterolemia, unspecified: Secondary | ICD-10-CM | POA: Diagnosis not present

## 2018-09-06 NOTE — Assessment & Plan Note (Signed)
The current medical regimen is effective;  continue present plan and medications.  

## 2018-09-06 NOTE — Progress Notes (Signed)
There were no vitals taken for this visit.   Subjective:    Patient ID: James MessierMichael L Traub, male    DOB: 1956-08-21, 62 y.o.   MRN: [id number]  HPI: [name] is a 62 y.o. male  DM BP check lipids  Patient all in all doing well has inadvertently taking his wife's medication with some resulting drowsiness been under a lot of stress but in spite of that his blood pressure cholesterol and diabetes have been doing well. Taking medications without problems. No low blood sugar spells.  Relevant past medical, surgical, family and social history reviewed and updated as indicated. Interim medical history since our last visit reviewed. Allergies and medications reviewed and updated.  Review of Systems  Constitutional: Negative.   Respiratory: Negative.   Cardiovascular: Negative.     Per HPI unless specifically indicated above     Objective:    There were no vitals taken for this visit.  Wt Readings from Last 3 Encounters:  04/26/18 170 lb (77.1 kg)  01/06/18 173 lb (78.5 kg)  09/24/17 171 lb (77.6 kg)    Physical Exam  Constitutional: He is oriented to person, place, and time. He appears well-developed and well-nourished.  HENT:  Head: Normocephalic and atraumatic.  Eyes: Conjunctivae and EOM are normal.  Neck: Normal range of motion.  Cardiovascular: Normal rate, regular rhythm and normal heart sounds.  Pulmonary/Chest: Effort normal and breath sounds normal.  Musculoskeletal: Normal range of motion.  Neurological: He is alert and oriented to person, place, and time.  Skin: No erythema.  Psychiatric: He has a normal mood and affect. His behavior is normal. Judgment and thought content normal.    Results for orders placed or performed in visit on 04/26/18  Basic metabolic panel  Result Value Ref Range   Glucose 113 (H) 65 - 99 mg/dL   BUN 21 8 - 27 mg/dL   Creatinine, Ser 7.820.90 0.76 - 1.27 mg/dL   GFR calc non Af Amer 90 >59 mL/min/1.73   GFR calc Af Amer 104 >59  mL/min/1.73   BUN/Creatinine Ratio 23 10 - 24   Sodium 141 134 - 144 mmol/L   Potassium 4.2 3.5 - 5.2 mmol/L   Chloride 101 96 - 106 mmol/L   CO2 23 20 - 29 mmol/L   Calcium 10.1 8.6 - 10.2 mg/dL  LP+ALT+AST Piccolo, Waived  Result Value Ref Range   ALT (SGPT) Piccolo, Waived 30 10 - 47 U/L   AST (SGOT) Piccolo, Waived 27 11 - 38 U/L   Cholesterol Piccolo, Waived 157 <200 mg/dL   HDL Chol Piccolo, Waived 47 (L) >59 mg/dL   Triglycerides Piccolo,Waived 162 (H) <150 mg/dL   Chol/HDL Ratio Piccolo,Waive 3.3 mg/dL   LDL Chol Calc Piccolo Waived 77 <100 mg/dL   VLDL Chol Calc Piccolo,Waive 32 (H) <30 mg/dL  Microalbumin, Urine Waived  Result Value Ref Range   Microalb, Ur Waived 10 0 - 19 mg/L   Creatinine, Urine Waived 100 10 - 300 mg/dL   Microalb/Creat Ratio <30 <30 mg/g  Bayer DCA Hb A1c Waived  Result Value Ref Range   HB A1C (BAYER DCA - WAIVED) 6.7 <7.0 %      Assessment & Plan:   Problem List Items Addressed This Visit      Endocrine   Diabetes mellitus without complication (HCC) - Primary    The current medical regimen is effective;  continue present plan and medications.       Relevant Orders  Basic metabolic panel   Bayer DCA Hb Z6X Waived   LP+ALT+AST Piccolo, Waived     Other   Hyperlipemia    The current medical regimen is effective;  continue present plan and medications.       Relevant Orders   Basic metabolic panel   Bayer DCA Hb W9U Waived   LP+ALT+AST Piccolo, [place]    Other Visit Diagnoses    Needs flu shot           Follow up plan: Return in about 6 months (around 03/07/2019) for Hemoglobin A1c, Physical Exam.

## 2018-09-07 ENCOUNTER — Encounter: Payer: Self-pay | Admitting: Family Medicine

## 2018-09-07 LAB — BASIC METABOLIC PANEL
BUN/Creatinine Ratio: 23 (ref 10–24)
BUN: 22 mg/dL (ref 8–27)
CALCIUM: 9 mg/dL (ref 8.6–10.2)
CHLORIDE: 101 mmol/L (ref 96–106)
CO2: 25 mmol/L (ref 20–29)
Creatinine, Ser: 0.95 mg/dL (ref 0.76–1.27)
GFR calc Af Amer: 97 mL/min/{1.73_m2} (ref >59)
GFR, EST NON AFRICAN AMERICAN: 84 mL/min/{1.73_m2} (ref >59)
GLUCOSE: 122 mg/dL — AB (ref 65–99)
POTASSIUM: 4.1 mmol/L (ref 3.5–5.2)
Sodium: 139 mmol/L (ref 134–144)

## 2018-09-07 LAB — LP+ALT+AST PICCOLO, WAIVED
ALT (SGPT) PICCOLO, WAIVED: 28 U/L (ref 10–47)
AST (SGOT) PICCOLO, WAIVED: 34 U/L (ref 11–38)
Chol/HDL Ratio Piccolo,Waive: 3.3 mg/dL
Cholesterol Piccolo, Waived: 132 mg/dL (ref <200)
HDL Chol Piccolo, Waived: 40 mg/dL — ABNORMAL LOW (ref >59)
LDL Chol Calc Piccolo Waived: 52 mg/dL (ref <100)
TRIGLYCERIDES PICCOLO,WAIVED: 202 mg/dL — AB (ref <150)
VLDL Chol Calc Piccolo,Waive: 40 mg/dL — ABNORMAL HIGH (ref <30)

## 2018-09-07 LAB — BAYER DCA HB A1C WAIVED: HB A1C (BAYER DCA - WAIVED): 6.4 %

## 2018-10-20 DIAGNOSIS — E119 Type 2 diabetes mellitus without complications: Secondary | ICD-10-CM | POA: Diagnosis not present

## 2018-11-20 DIAGNOSIS — E119 Type 2 diabetes mellitus without complications: Secondary | ICD-10-CM | POA: Diagnosis not present

## 2018-11-24 ENCOUNTER — Telehealth: Payer: Self-pay | Admitting: Family Medicine

## 2018-11-24 ENCOUNTER — Other Ambulatory Visit: Payer: Self-pay | Admitting: Family Medicine

## 2018-11-24 DIAGNOSIS — E119 Type 2 diabetes mellitus without complications: Secondary | ICD-10-CM

## 2018-11-24 MED ORDER — "NEEDLE (DISP) 30G X 1/2"" MISC": 1.0000 | 12 refills | Status: DC

## 2018-11-24 MED ORDER — DULAGLUTIDE 0.75 MG/0.5ML ~~LOC~~ SOAJ: SUBCUTANEOUS | 0 refills | Status: DC

## 2018-11-24 NOTE — Telephone Encounter (Signed)
Patient requesting needle tips for Trulicity Pen. No current order for these on file. Routing to PCP for order. [facility]

## 2018-11-24 NOTE — Telephone Encounter (Signed)
Copied from CRM [id number]. Topic: Quick Communication - Rx Refill/Question >> Nov 24, 2018 10:07 AM Gerrianne Scale wrote: Medication: Dulaglutide (TRULICITY) 0.75 MG/0.5ML SOPN  90 day supply   Has the patient contacted their pharmacy? Yes.   (Agent: If no, request that the patient contact the pharmacy for the refill.) (Agent: If yes, when and what did the pharmacy advise?) pharmacy states that they sent over a request on Monday and last night  Preferred Pharmacy (with phone number or street name):     [id number] [place], [place] - [address] [phone] (Phone) [phone] (Fax)    Agent: Please be advised that RX refills may take up to 3 business days. We ask that you follow-up with your pharmacy.

## 2018-11-24 NOTE — Addendum Note (Signed)
Addended by: Vonita Moss A on: 11/24/2018 11:34 AM   Modules accepted: Orders

## 2018-11-24 NOTE — Telephone Encounter (Signed)
Patient has appointment 01/12/19 Requested Prescriptions  Pending Prescriptions Disp Refills  . Dulaglutide (TRULICITY) 0.75 MG/0.5ML SOPN 12 pen 0    Sig: Inject once a week     Endocrinology:  Diabetes - GLP-1 Receptor Agonists Passed - 11/24/2018 10:11 AM      Passed - HBA1C is between 0 and 7.9 and within 180 days    HB A1C (BAYER DCA - WAIVED)  Date Value Ref Range Status  09/06/2018 6.4 <7.0 % Final    Comment:                                          Diabetic Adult            <7.0                                       Healthy Adult        4.3 - 5.7                                                           (DCCT/NGSP) American Diabetes Association's Summary of Glycemic Recommendations for Adults with Diabetes: Hemoglobin A1c <7.0%. More stringent glycemic goals (A1c <6.0%) may further reduce complications at the cost of increased risk of hypoglycemia.          Passed - Valid encounter within last 6 months    Recent Outpatient Visits          2 months ago Diabetes mellitus without complication (HCC)   [facility] Crissman, Redge Gainer, MD   7 months ago Diabetes mellitus without complication (HCC)   [facility] Crissman, Redge Gainer, MD   10 months ago Diabetes mellitus without complication (HCC)   [facility] Crissman, Redge Gainer, MD   1 year ago Diabetes mellitus without complication (HCC)   [facility] Crissman, Redge Gainer, MD   1 year ago Diabetes mellitus without complication [facility])   [facility] Crissman, Redge Gainer, MD      Future Appointments            In 1 month [facility], Redge Gainer, MD [facility]

## 2018-12-03 ENCOUNTER — Other Ambulatory Visit: Payer: Self-pay | Admitting: Family Medicine

## 2018-12-03 NOTE — Telephone Encounter (Signed)
Requested Prescriptions  Pending Prescriptions Disp Refills  . naproxen (NAPROSYN) 500 MG tablet [Pharmacy Med Name: NAPROXEN 500 MG TABLET] 180 tablet 0    Sig: TAKE 1 TABLET (500 MG TOTAL) BY MOUTH 2 (TWO) TIMES DAILY WITH A MEAL.     Analgesics:  NSAIDS Passed - 12/03/2018  1:28 AM      Passed - Cr in normal range and within 360 days    Creatinine  Date Value Ref Range Status  12/07/2012 1.11 0.60 - 1.30 mg/dL Final   Creatinine, Ser  Date Value Ref Range Status  09/06/2018 0.95 0.76 - 1.27 mg/dL Final         Passed - HGB in normal range and within 360 days    Hemoglobin  Date Value Ref Range Status  01/06/2018 14.8 13.0 - 17.7 g/dL Final         Passed - Patient is not pregnant      Passed - Valid encounter within last 12 months    Recent Outpatient Visits          2 months ago Diabetes mellitus without complication (HCC)   [facility] Crissman, Mark A, MD   7 months ago Diabetes mellitus without complication (HCC)   [facility] Crissman, Redge Gainer, MD   11 months ago Diabetes mellitus without complication (HCC)   [facility] Crissman, Redge Gainer, MD   1 year ago Diabetes mellitus without complication (HCC)   [facility] Crissman, Redge Gainer, MD   1 year ago Diabetes mellitus without complication [facility])   [facility] Crissman, Redge Gainer, MD      Future Appointments            In 1 month Crissman, Redge Gainer, MD [facility], [facility]

## 2018-12-16 ENCOUNTER — Other Ambulatory Visit: Payer: Self-pay | Admitting: Family Medicine

## 2018-12-16 DIAGNOSIS — F5102 Adjustment insomnia: Secondary | ICD-10-CM

## 2018-12-16 NOTE — Telephone Encounter (Signed)
Requested medication (s) are due for refill today: yes  Requested medication (s) are on the active medication list: yes  Last refill:  04/26/2018  Future visit scheduled: yes  Notes to clinic:  Not delegated    Requested Prescriptions  Pending Prescriptions Disp Refills   clonazePAM (KLONOPIN) 1 MG tablet [Pharmacy Med Name: CLONAZEPAM 1 MG TABLET] 30 tablet     Sig: Take 1 tablet (1 mg total) by mouth daily as needed for anxiety.     Not Delegated - Psychiatry:  Anxiolytics/Hypnotics Failed - 12/16/2018  4:43 PM      Failed - This refill cannot be delegated      Failed - Urine Drug Screen completed in last 360 days.      Passed - Valid encounter within last 6 months    Recent Outpatient Visits          3 months ago Diabetes mellitus without complication (HCC)   [facility] Crissman, Redge Gainer, MD   7 months ago Diabetes mellitus without complication (HCC)   [facility] Crissman, Redge Gainer, MD   11 months ago Diabetes mellitus without complication (HCC)   [facility] Crissman, Redge Gainer, MD   1 year ago Diabetes mellitus without complication (HCC)   [facility] Crissman, Redge Gainer, MD   1 year ago Diabetes mellitus without complication [facility])   [facility] Crissman, Redge Gainer, MD      Future Appointments            In 3 weeks Crissman, Redge Gainer, MD [facility], [facility]

## 2018-12-17 NOTE — Telephone Encounter (Signed)
Will leave for PCP on Tuesday

## 2019-01-12 ENCOUNTER — Ambulatory Visit: Payer: Commercial Managed Care - PPO | Admitting: Family Medicine

## 2019-01-13 ENCOUNTER — Ambulatory Visit (INDEPENDENT_AMBULATORY_CARE_PROVIDER_SITE_OTHER): Payer: Commercial Managed Care - PPO | Admitting: Family Medicine

## 2019-01-13 ENCOUNTER — Other Ambulatory Visit: Payer: Self-pay

## 2019-01-13 ENCOUNTER — Encounter: Payer: Self-pay | Admitting: Family Medicine

## 2019-01-13 ENCOUNTER — Telehealth: Payer: Self-pay | Admitting: Family Medicine

## 2019-01-13 VITALS — Wt 172.0 lb

## 2019-01-13 DIAGNOSIS — E7800 Pure hypercholesterolemia, unspecified: Secondary | ICD-10-CM | POA: Diagnosis not present

## 2019-01-13 DIAGNOSIS — Z1211 Encounter for screening for malignant neoplasm of colon: Secondary | ICD-10-CM

## 2019-01-13 DIAGNOSIS — E1169 Type 2 diabetes mellitus with other specified complication: Secondary | ICD-10-CM

## 2019-01-13 DIAGNOSIS — Z566 Other physical and mental strain related to work: Secondary | ICD-10-CM

## 2019-01-13 DIAGNOSIS — F5102 Adjustment insomnia: Secondary | ICD-10-CM | POA: Diagnosis not present

## 2019-01-13 MED ORDER — METFORMIN HCL 500 MG PO TABS: 1000.0000 mg | ORAL_TABLET | Freq: Two times a day (BID) | ORAL | 4 refills | Status: DC

## 2019-01-13 MED ORDER — SUCRALFATE 1 G PO TABS: 1.0000 g | ORAL_TABLET | Freq: Two times a day (BID) | ORAL | 4 refills | Status: DC

## 2019-01-13 MED ORDER — ATORVASTATIN CALCIUM 40 MG PO TABS: 40.0000 mg | ORAL_TABLET | Freq: Every day | ORAL | 4 refills | Status: DC

## 2019-01-13 MED ORDER — CLONAZEPAM 1 MG PO TABS: 1.0000 mg | ORAL_TABLET | Freq: Every day | ORAL | 1 refills | Status: DC | PRN

## 2019-01-13 MED ORDER — PANTOPRAZOLE SODIUM 40 MG PO TBEC: 40.0000 mg | DELAYED_RELEASE_TABLET | Freq: Every day | ORAL | 4 refills | Status: DC

## 2019-01-13 MED ORDER — DAPAGLIFLOZIN PROPANEDIOL 10 MG PO TABS: 10.0000 mg | ORAL_TABLET | Freq: Every day | ORAL | 4 refills | Status: DC

## 2019-01-13 MED ORDER — DULAGLUTIDE 0.75 MG/0.5ML ~~LOC~~ SOAJ: SUBCUTANEOUS | 12 refills | Status: DC

## 2019-01-13 NOTE — Telephone Encounter (Signed)
Copied from CRM [id number]. Topic: General - Inquiry >> Jan 13, 2019  3:27 PM Lynne Logan D wrote: Reason for CRM: Pt's wife called and stated that pt would like to go ahead and antibiotic that was discussed with Dr. Dossie Arbour today due to pt being in pain. Please advise. CB# [phone] [id number] [place], [place] - [address] [phone] (Phone) [phone] (Fax)

## 2019-01-13 NOTE — Assessment & Plan Note (Signed)
The current medical regimen is effective;  continue present plan and medications.  

## 2019-01-13 NOTE — Assessment & Plan Note (Signed)
Discussed medication care and treatment will continue clonazepam patient retiring May 1 discussed tapering and stopping clonazepam after retirement

## 2019-01-13 NOTE — Progress Notes (Signed)
Wt 172 lb (78 kg) Comment: Patient reported  BMI 23.33 kg/m    Subjective:    Patient ID: [name], male    DOB: [birth year], 63 y.o.   MRN: [id number]  HPI: [name] is a 63 y.o. male   Telemedicine using audio and telecommunications for a synchronous communication visit. Today's visit due to COVID-19 isolation precautions I connected with Laray Anger  and verified that I am speaking with the correct person using two identifiers.   I discussed the limitations, risks, security and privacy concerns of performing an evaluation and management service by telephone and the availability of in person appointments. I also discussed with the patient that there may be a patient responsible charge related to this service. The patient expressed understanding and agreed to proceed. Chief Complaint  Patient presents with  . Diabetes    Glucose was 125 this morning.   . Testicle Pain    Lower right, dull achy  . Referral    Colonoscopy   Discussed diabetes and diabetes control. Patient doing well with no complaints no low blood sugar spells Home glucose monitoring indicating blood sugars averaging in the 130s.  Has occasional spikes and occasional lows to 90s.  Patient feels well otherwise. Other medical issues stable with blood pressure cholesterol taking medicines faithfully without problems. Patient's biggest issue is ongoing anxiety secondary to work and his bosses.  Patient is going to be retiring May 1 in the meantime is taking clonazepam 1 mg every day to get through work and needs a refill will be able to stop after that point. Discussed tapering and stopping after May 1.  Reflux stable. Discussed testicle pains only been ongoing for 2 days no blood in stool or urine no other abnormalities will observe but keep in the back of her mind about prostate infection  Relevant past medical, surgical, family and social history reviewed and updated as indicated. Interim medical history  since our last visit reviewed. Allergies and medications reviewed and updated.  Review of Systems  Constitutional: Negative.   Respiratory: Negative.   Cardiovascular: Negative.     Per HPI unless specifically indicated above     Objective:    Wt 172 lb (78 kg) Comment: Patient reported  BMI 23.33 kg/m   Wt Readings from Last 3 Encounters:  01/13/19 172 lb (78 kg)  09/06/18 174 lb (78.9 kg)  04/26/18 170 lb (77.1 kg)    Physical Exam None Results for orders placed or performed in visit on 09/06/18  Basic metabolic panel  Result Value Ref Range   Glucose 122 (H) 65 - 99 mg/dL   BUN 22 8 - 27 mg/dL   Creatinine, Ser 8.56 0.76 - 1.27 mg/dL   GFR calc non Af Amer 84 >59 mL/min/1.73   GFR calc Af Amer 97 >59 mL/min/1.73   BUN/Creatinine Ratio 23 10 - 24   Sodium 139 134 - 144 mmol/L   Potassium 4.1 3.5 - 5.2 mmol/L   Chloride 101 96 - 106 mmol/L   CO2 25 20 - 29 mmol/L   Calcium 9.0 8.6 - 10.2 mg/dL  Bayer DCA Hb D1S Waived  Result Value Ref Range   HB A1C (BAYER DCA - WAIVED) 6.4 <7.0 %  LP+ALT+AST Piccolo, Waived  Result Value Ref Range   ALT (SGPT) Piccolo, Waived 28 10 - 47 U/L   AST (SGOT) Piccolo, Waived 34 11 - 38 U/L   Cholesterol Piccolo, Waived 132 <200 mg/dL   HDL Chol  Piccolo, Waived 40 (L) >59 mg/dL   Triglycerides Piccolo,Waived 202 (H) <150 mg/dL   Chol/HDL Ratio Piccolo,Waive 3.3 mg/dL   LDL Chol Calc Piccolo Waived 52 <100 mg/dL   VLDL Chol Calc Piccolo,Waive 40 (H) <30 mg/dL      Assessment & Plan:   Problem List Items Addressed This Visit      Endocrine   Diabetes mellitus associated with hormonal etiology (HCC)    The current medical regimen is effective;  continue present plan and medications.       Relevant Medications   metFORMIN (GLUCOPHAGE) 500 MG tablet   dapagliflozin propanediol (FARXIGA) 10 MG TABS tablet   atorvastatin (LIPITOR) 40 MG tablet   Dulaglutide (TRULICITY) 0.75 MG/0.5ML SOPN     Other   Hyperlipemia    The  current medical regimen is effective;  continue present plan and medications.       Relevant Medications   atorvastatin (LIPITOR) 40 MG tablet   Insomnia due to stress   Relevant Medications   clonazePAM (KLONOPIN) 1 MG tablet   Stress at work    Discussed medication care and treatment will continue clonazepam patient retiring May 1 discussed tapering and stopping clonazepam after retirement       Other Visit Diagnoses    Encounter for screening colonoscopy    -  Primary   Relevant Orders   Ambulatory referral to Gastroenterology       Follow up plan: Return in about 3 months (around 04/15/2019) for Physical Exam, Hemoglobin A1c.

## 2019-01-17 MED ORDER — DOXYCYCLINE HYCLATE 100 MG PO TABS: 100.0000 mg | ORAL_TABLET | Freq: Two times a day (BID) | ORAL | 0 refills | Status: DC

## 2019-02-08 ENCOUNTER — Other Ambulatory Visit: Payer: Self-pay | Admitting: Family Medicine

## 2019-02-15 ENCOUNTER — Telehealth: Payer: Self-pay | Admitting: Family Medicine

## 2019-02-15 DIAGNOSIS — E1169 Type 2 diabetes mellitus with other specified complication: Secondary | ICD-10-CM

## 2019-02-15 NOTE — Telephone Encounter (Signed)
Patient is needing an urgent referral put in with notes stating that he is a diabetic. He is wanting the referral put in to Dr Gershon Crane in [place]-endocrinologist.  Patient is retiring Friday and needs this done ASAP possibly tomorrow so they can at least give him the appointment.  Please expedite  Thank you

## 2019-02-27 ENCOUNTER — Other Ambulatory Visit: Payer: Self-pay | Admitting: Family Medicine

## 2019-03-02 DIAGNOSIS — R1013 Epigastric pain: Secondary | ICD-10-CM | POA: Diagnosis not present

## 2019-03-02 DIAGNOSIS — R112 Nausea with vomiting, unspecified: Secondary | ICD-10-CM | POA: Diagnosis not present

## 2019-03-02 DIAGNOSIS — Z8371 Family history of colonic polyps: Secondary | ICD-10-CM | POA: Diagnosis not present

## 2019-03-02 DIAGNOSIS — K219 Gastro-esophageal reflux disease without esophagitis: Secondary | ICD-10-CM | POA: Diagnosis not present

## 2019-03-02 DIAGNOSIS — Z86010 Personal history of colonic polyps: Secondary | ICD-10-CM | POA: Diagnosis not present

## 2019-03-09 DIAGNOSIS — E1169 Type 2 diabetes mellitus with other specified complication: Secondary | ICD-10-CM | POA: Insufficient documentation

## 2019-03-09 DIAGNOSIS — E785 Hyperlipidemia, unspecified: Secondary | ICD-10-CM | POA: Insufficient documentation

## 2019-03-09 DIAGNOSIS — E119 Type 2 diabetes mellitus without complications: Secondary | ICD-10-CM | POA: Diagnosis not present

## 2019-03-10 ENCOUNTER — Encounter: Payer: Self-pay | Admitting: *Deleted

## 2019-03-15 DIAGNOSIS — Z8371 Family history of colonic polyps: Secondary | ICD-10-CM | POA: Diagnosis not present

## 2019-03-15 DIAGNOSIS — K640 First degree hemorrhoids: Secondary | ICD-10-CM | POA: Diagnosis not present

## 2019-03-15 DIAGNOSIS — K2970 Gastritis, unspecified, without bleeding: Secondary | ICD-10-CM | POA: Diagnosis not present

## 2019-03-15 DIAGNOSIS — R110 Nausea: Secondary | ICD-10-CM | POA: Diagnosis not present

## 2019-03-15 DIAGNOSIS — Z86010 Personal history of colonic polyps: Secondary | ICD-10-CM | POA: Diagnosis not present

## 2019-03-15 DIAGNOSIS — K210 Gastro-esophageal reflux disease with esophagitis: Secondary | ICD-10-CM | POA: Diagnosis not present

## 2019-03-15 DIAGNOSIS — K2950 Unspecified chronic gastritis without bleeding: Secondary | ICD-10-CM | POA: Diagnosis not present

## 2019-03-15 LAB — HM COLONOSCOPY

## 2019-03-20 ENCOUNTER — Other Ambulatory Visit: Payer: Self-pay | Admitting: Family Medicine

## 2019-03-23 LAB — HM DIABETES EYE EXAM

## 2019-03-30 DIAGNOSIS — H52223 Regular astigmatism, bilateral: Secondary | ICD-10-CM | POA: Insufficient documentation

## 2019-03-30 DIAGNOSIS — H524 Presbyopia: Secondary | ICD-10-CM | POA: Insufficient documentation

## 2019-03-30 DIAGNOSIS — H353131 Nonexudative age-related macular degeneration, bilateral, early dry stage: Secondary | ICD-10-CM | POA: Insufficient documentation

## 2019-04-20 ENCOUNTER — Ambulatory Visit: Payer: Self-pay | Admitting: Pharmacist

## 2019-04-20 DIAGNOSIS — E1169 Type 2 diabetes mellitus with other specified complication: Secondary | ICD-10-CM

## 2019-04-20 DIAGNOSIS — E7800 Pure hypercholesterolemia, unspecified: Secondary | ICD-10-CM

## 2019-04-20 DIAGNOSIS — K2970 Gastritis, unspecified, without bleeding: Secondary | ICD-10-CM | POA: Diagnosis not present

## 2019-04-20 DIAGNOSIS — K209 Esophagitis, unspecified: Secondary | ICD-10-CM | POA: Diagnosis not present

## 2019-04-20 NOTE — Patient Instructions (Signed)
Visit Information  Goals Addressed            This Visit's Progress     Patient Stated   . "We can't afford his medications" (pt-stated)       Current Barriers:  . T2DM, well controlled with A1c 6.4% in 08/2018; though patient's wife notes that sugars have been higher recently. Managed by [name] at [facility] o Patient reporting significant stress recently with transition to retirement;  . Financial concerns - patient transitioned to [place] with retirement; notes that [place] copayment is >$180 . Also on metformin 1000 mg QAM, 500 mg QPM (was having sweating episodes overnight, so decided to reduce evening metformin dose); Trulicity 9.38 mg once weekly . Patient's wife is also retired; total household income (his Fish farm manager, her Fish farm manager, his pension) is <$40,000  Pharmacist Clinical Goal(s):  [name] Over the next 90 days, patient will work with PharmD and primary care provider to address needs related to optimized medication management  Interventions: . Comprehensive medication review performed. . Reviewed formulary. Wilder Glade is NOT covered; Vania Rea is. [facility] End[facility]ology; left message for [name] - recommend switch to Jardiance 25 mg daily.  Lorrene Sherrow on Medicare Coverage Gap ("donut hole"), as patient's wife had never heard this term . Appears patient will qualify for assistance for Trulicity through [place] and [place] through [employer]. Discuss process with patient's wife. Notified [name] that I would be faxing over provider portion of applications for Trulicity and Jardiance. Patient and his wife will come by clinic to 1) sign applications 2) drop off copies of proof of income and 3) copy of new [employer] insurance card . Counseled that if needed moving forward, Trulicity can be maximized to 1.5 mg weekly dose  Patient Self Care Activities:  . Self administers medications as prescribed . Calls  provider office for new concerns or questions  Initial goal documentation     . "We want to make sure his medications are OK" (pt-stated)       Current Barriers:  . Upon medication review today, two things noted:  o Patient taking turmeric for inflammation.  o Patient noted recurrence of "fluid sac" near testes; Dr. Jeananne Rama prescribed doxycyline for this in 12/2018; they wonder if patient needs another course of ABX or if he needs to be seen  o Patient notes that Dr. Loanne Drilling (GI) instructed him to take pantoprazole BID, omeprazole BID, and sucralfate for adenomatous colon polyps and GERD. Wife notes that patient was instructed to add omeprazole to pantoprazole after colonoscopy on 03/15/2019;   Pharmacist Clinical Goal(s):  [name] Over the next 30 days, patient will work with PharmD and primary care provider to address needs related to optimized medication management  Interventions: . Comprehensive medication review performed. . Notified patient's wife that I would notify Dr. Jeananne Rama regarding recurrence of testes pain to see if ABX or office visit are necessary . Reviewed supplement interaction data base. Would recommend d/c turmeric, as an increased risk of GI bleeds with NSAIDs/ASA. I cannot see the result of the colonoscopy/endoscopy on 03/15/2019, but have encouraged patient to discuss turmeric with Dr. Loanne Drilling today at appointment.  Patient Self Care Activities:  . Calls pharmacy for medication refills . Calls provider office for new concerns or questions  Initial goal documentation        The patient verbalized understanding of instructions provided today and declined a print copy of patient instruction materials.   Plan: - Will collaborate  with primary care provider and endocrinology as above - Will submit patient assistance applications once all parts collected   Catie Feliz Beamravis, PharmD Clinical Pharmacist [facility] [phone]

## 2019-04-20 NOTE — Chronic Care Management (AMB) (Signed)
Chronic Care Management   Note  04/20/2019 Name: [name] MRN: [id number] DOB: October 11, 1956   Subjective:  [name] is a 63 y.o. year old male who is a primary care patient of Crissman, Jeannette How, MD. The CCM team was consulted for assistance with chronic disease management and care coordination needs.   Received notice that patient could no longer afford his medications. Contacted patient, spoke with his wife today.     Mr. Kundinger wife was given information about Chronic Care Management services today including:  1. CCM service includes personalized support from designated clinical staff supervised by his physician, including individualized plan of care and coordination with other care providers 2. 24/7 contact phone numbers for assistance for urgent and routine care needs. 3. Service will only be billed when office clinical staff spend 20 minutes or more in a month to coordinate care. 4. Only one practitioner may furnish and bill the service in a calendar month. 5. The patient may stop CCM services at any time (effective at the end of the month) by phone call to the office staff. 6. The patient will be responsible for cost sharing (co-pay) of up to 20% of the service fee (after annual deductible is met).  Patient's wife agreed to services and verbal consent obtained.   Review of patient status, including review of consultants reports, laboratory and other test data, was performed as part of comprehensive evaluation and provision of chronic care management services.   Objective:  Lab Results  Component Value Date   CREATININE 0.95 09/06/2018   CREATININE 0.90 04/26/2018   CREATININE 0.95 01/06/2018    Lab Results  Component Value Date   HGBA1C 6.4 09/06/2018       Component Value Date/Time   CHOL 132 09/06/2018 1603   TRIG 202 (H) 09/06/2018 1603   HDL 49 01/06/2018 1558   CHOLHDL 3.7 01/06/2018 1558   VLDL 40 (H) 09/06/2018 1603   LDLCALC 92 01/06/2018 1558     Clinical ASCVD: No  The 10-year ASCVD risk score Mikey Bussing DC Jr., et al., 2013) is: 17%   Values used to calculate the score:     Age: 37 years     Sex: Male     Is Non-Hispanic African American: No     Diabetic: Yes     Tobacco smoker: No     Systolic Blood Pressure: 597 mmHg     Is BP treated: No     HDL Cholesterol: 40 mg/dL     Total Cholesterol: 132 mg/dL    BP Readings from Last 3 Encounters:  09/06/18 122/73  04/26/18 119/62  01/06/18 106/64    Medications Reviewed Today    Reviewed by De Hollingshead, [place] (Pharmacist) on 04/20/19 at 1218  Med List Status: <None>  Medication Order Taking? Sig Documenting Provider Last Dose Status Informant  aspirin EC 81 MG tablet [id number] Yes Take 81 mg by mouth daily. [provider] Taking Active   atorvastatin (LIPITOR) 40 MG tablet [id number] Yes Take 1 tablet (40 mg total) by mouth daily. Guadalupe Maple, MD Taking Active   Blood Glucose Monitoring Suppl (ACCU-CHEK AVIVA PLUS) w/Device KIT [id number] Yes Use kit to check sugar daily Guadalupe Maple, MD Taking Active   Cholecalciferol (VITAMIN D-3 PO) [id number] Yes Take 5,000 Units/day by mouth daily.  [provider] Taking Active   clonazePAM (KLONOPIN) 1 MG tablet [id number] Yes Take 1 tablet (1 mg total) by mouth daily as needed for anxiety.  Guadalupe Maple, MD Taking Active            Med Note Darnelle Maffucci, Arville Lime   Wed Apr 20, 2019 11:21 AM) 1/2 tablet   dapagliflozin propanediol (FARXIGA) 10 MG TABS tablet [id number] Yes Take 10 mg by mouth daily. Guadalupe Maple, MD Taking Active   doxycycline (VIBRA-TABS) 100 MG tablet [id number] No Take 1 tablet (100 mg total) by mouth 2 (two) times daily. Caution about sunburn  Patient not taking: Reported on 04/20/2019   Guadalupe Maple, MD Not Taking Active   Dulaglutide (TRULICITY) 1.74 BS/4.9QP [place] Aid [id number] Yes Inject once a week Guadalupe Maple, MD Taking Active   metFORMIN (GLUCOPHAGE) 500 MG tablet [id number] Yes  Take 2 tablets (1,000 mg total) by mouth 2 (two) times daily. Guadalupe Maple, MD Taking Active   Multiple Vitamins-Minerals (MULTIVITAMIN ADULT PO) [id number] Yes Take by mouth. [provider] Taking Active   omeprazole (PRILOSEC) 40 MG capsule [id number] Yes Take 40 mg by mouth daily. [provider] Taking Active   ONE TOUCH ULTRA TEST test strip [id number] Yes USE TO CHECK BLOOD SUGAR LEVELS THREE TIMES DAILY. DX:E11.9 Guadalupe Maple, MD Taking Active   pantoprazole (PROTONIX) 40 MG tablet [id number] Yes TAKE 1 TABLET BY MOUTH EVERY DAY  Patient taking differently: 2 (two) times daily.    Guadalupe Maple, MD Taking Active   sucralfate (CARAFATE) 1 g tablet [id number] Yes Take 1 tablet (1 g total) by mouth 2 (two) times daily. Guadalupe Maple, MD Taking Active   TURMERIC PO [id number] Yes Take by mouth. [provider] Taking Active            Assessment:   Goals Addressed            This Visit's Progress     Patient Stated   . "We can't afford his medications" (pt-stated)       Current Barriers:  . T2DM, well controlled with A1c 6.4% in 08/2018; though patient's wife notes that sugars have been higher recently. Managed by [name] at [facility] o Patient reporting significant stress recently with transition to retirement;  . Financial concerns - patient transitioned to [place] with retirement; notes that [place] copayment is >$180 . Also on metformin 1000 mg QAM, 500 mg QPM (was having sweating episodes overnight, so decided to reduce evening metformin dose); Trulicity 4.16 mg once weekly . Patient's wife is also retired; total household income (his Fish farm manager, her Fish farm manager, his pension) is <$40,000  Pharmacist Clinical Goal(s):  [name] Over the next 90 days, patient will work with PharmD and primary care provider to address needs related to optimized medication management  Interventions: . Comprehensive  medication review performed. . Reviewed formulary. Wilder Glade is NOT covered; Vania Rea is. [facility]; left message for [name] - recommend switch to Jardiance 25 mg daily.  Lorrene Kassem on Medicare Coverage Gap ("donut hole"), as patient's wife had never heard this term . Appears patient will qualify for assistance for Trulicity through [place] and [place] through [employer]. Discuss process with patient's wife. Notified [name] that I would be faxing over provider portion of applications for Trulicity and Jardiance. Patient and his wife will come by clinic to 1) sign applications 2) drop off copies of proof of income and 3) copy of new [employer] insurance card . Counseled that if needed moving forward, Trulicity can be maximized to 1.5 mg weekly dose  Patient Self Care  Activities:  . Self administers medications as prescribed . Calls provider office for new concerns or questions  Initial goal documentation     . "We want to make sure his medications are OK" (pt-stated)       Current Barriers:  . Upon medication review today, two things noted:  o Patient taking turmeric for inflammation.  o Patient noted recurrence of "fluid sac" near testes; Dr. Jeananne Rama prescribed doxycyline for this in 12/2018; they wonder if patient needs another course of ABX or if he needs to be seen  o Patient notes that Dr. Loanne Drilling (GI) instructed him to take pantoprazole BID, omeprazole BID, and sucralfate for adenomatous colon polyps and GERD. Wife notes that patient was instructed to add omeprazole to pantoprazole after colonoscopy on 03/15/2019;   Pharmacist Clinical Goal(s):  [name] Over the next 30 days, patient will work with PharmD and primary care provider to address needs related to optimized medication management  Interventions: . Comprehensive medication review performed. . Notified patient's wife that I would notify Dr. Jeananne Rama regarding recurrence of testes pain to  see if ABX or office visit are necessary . Reviewed supplement interaction data base. Would recommend d/c turmeric, as an increased risk of GI bleeds with NSAIDs/ASA. I cannot see the result of the colonoscopy/endoscopy on 03/15/2019, but have encouraged patient to discuss turmeric with Dr. Loanne Drilling today at appointment.  Patient Self Care Activities:  . Calls pharmacy for medication refills . Calls provider office for new concerns or questions  Initial goal documentation        Plan: - Will collaborate with primary care provider and endocrinology as above - Will submit patient assistance applications once all parts collected   Catie Darnelle Maffucci, PharmD Clinical Pharmacist [place] [phone]

## 2019-04-27 ENCOUNTER — Ambulatory Visit: Payer: Self-pay | Admitting: Pharmacist

## 2019-04-27 DIAGNOSIS — E1169 Type 2 diabetes mellitus with other specified complication: Secondary | ICD-10-CM

## 2019-04-27 NOTE — Chronic Care Management (AMB) (Signed)
  Chronic Care Management   Follow Up Note   04/27/2019 Name: [name] MRN: [id number] DOB: July 04, 1956  Referred by: James Maple, MD Reason for referral : Chronic Care Management (Medication Management)   ODIE Osborn is a 63 y.o. year old male who is a primary care patient of Crissman, James How, MD. The CCM team was consulted for assistance with chronic disease management and care coordination needs.    Care coordination completed today.   Review of patient status, including review of consultants reports, relevant laboratory and other test results, and collaboration with appropriate care team members and the patient's provider was performed as part of comprehensive patient evaluation and provision of chronic care management services.    Goals Addressed            This Visit's Progress     Patient Stated   . "We can't afford his medications" (pt-stated)       Current Barriers:  . T2DM, well controlled with A1c 6.4% in 08/2018; though patient's wife notes that sugars have been higher recently. Managed by [name] at [facility] o Patient reporting significant stress recently with transition to retirement;  . Financial concerns - patient transitioned to [employer] with retirement; Wilder Glade is not covered, but [name] is  o Also on metformin 1000 mg QAM, 500 mg QPM (was having sweating episodes overnight, so decided to reduce evening metformin dose); Trulicity 8.93 mg once weekly . Patient's wife is also retired; total household income (his Fish farm manager, her social security, his pension) is <$40,000 . Faxed patient assistance applications for Trulicity [employer]orticultur[employer] commercial) and [employer] to [facility] Endocrinology on 04/20/2019  Pharmacist Clinical Goal(s):  [name] Over the next 90 days, patient will work with PharmD and primary care provider to address needs related to optimized medication management  Interventions: . [facility] to f/u on status of Dr. Sherren Mocha signature. They noted that it was faxed back 7/2, but I cannot find it. Asked them to refax; alerted [facility] front desk staff to be on the lookout for these documents.   Patient Self Care Activities:  . Self administers medications as prescribed . Calls provider office for new concerns or questions  Please see past updates related to this goal by clicking on the "Past Updates" button in the selected goal          Plan:  - Will follow up on status of patient assistance application in the next 2-3 weeks  [name], PharmD Clinical Pharmacist [place] [phone]

## 2019-04-27 NOTE — Patient Instructions (Signed)
Visit Information  Goals Addressed            This Visit's Progress     Patient Stated   . "We can't afford his medications" (pt-stated)       Current Barriers:  . T2DM, well controlled with A1c 6.4% in 08/2018; though patient's wife notes that sugars have been higher recently. Managed by [name] at [facility] o Patient reporting significant stress recently with transition to retirement;  . Financial concerns - patient transitioned to [place] with retirement; Wilder Glade is not covered, but Vania Rea is  o Also on metformin 1000 mg QAM, 500 mg QPM (was having sweating episodes overnight, so decided to reduce evening metformin dose); Trulicity 5.09 mg once weekly . Patient's wife is also retired; total household income (his Fish farm manager, her social security, his pension) is <$40,000 . Faxed patient assistance applications for Trulicity Horticulturist, commercial) and [employer] to [facility] Endocrinology on 04/20/2019  Pharmacist Clinical Goal(s):  [name] Over the next 90 days, patient will work with PharmD and primary care provider to address needs related to optimized medication management  Interventions: . [facility] Endocrinology to f/u on status of Dr. Sherren Mocha signature. They noted that it was faxed back 7/2, but I cannot find it. Asked them to refax; alerted [facility] front desk staff to be on the lookout for these documents.   Patient Self Care Activities:  . Self administers medications as prescribed . Calls provider office for new concerns or questions  Please see past updates related to this goal by clicking on the "Past Updates" button in the selected goal         The patient verbalized understanding of instructions provided today and declined a print copy of patient instruction materials.   Plan:  - Will follow up on status of patient assistance application in the next 2-3 weeks  Catie Darnelle Maffucci,  PharmD Clinical Pharmacist [place] [phone]

## 2019-05-03 DIAGNOSIS — H0011 Chalazion right upper eyelid: Secondary | ICD-10-CM | POA: Diagnosis not present

## 2019-05-10 ENCOUNTER — Ambulatory Visit: Payer: Self-pay | Admitting: Pharmacist

## 2019-05-10 DIAGNOSIS — E1169 Type 2 diabetes mellitus with other specified complication: Secondary | ICD-10-CM

## 2019-05-10 NOTE — Chronic Care Management (AMB) (Signed)
  Chronic Care Management   Follow Up Note   05/10/2019 Name: LAKSH HINNERS MRN: [id number] DOB: Sep 29, 1956  Referred by: Guadalupe Maple, MD Reason for referral : Chronic Care Management (Medication Management)   HANNIBAL SKALLA is a 63 y.o. year old male who is a primary care patient of Crissman, Jeannette How, MD. The CCM team was consulted for assistance with chronic disease management and care coordination needs.   Care coordination completed today.    Review of patient status, including review of consultants reports, relevant laboratory and other test results, and collaboration with appropriate care team members and the patient's provider was performed as part of comprehensive patient evaluation and provision of chronic care management services.    Goals Addressed            This Visit's Progress     Patient Stated   . "We can't afford his medications" (pt-stated)       Current Barriers:  . T2DM, well controlled with A1c 6.4% in 08/2018; though patient's wife notes that sugars have been higher recently. Managed by [name] at [facility] . Collaborated with [name] - will apply for Trulicity patient assistance through [facility], and Jardiance patient assistance through [employer] (patient does not qualify for [employer] assistance for [employer])  Pharmacist Clinical Goal(s):  [name] Over the next 90 days, patient will work with PharmD and primary care provider to address needs related to optimized medication management  Interventions: . Received provider and patient portions of applications, as well as patient financial information and insurance card copy. Faxed Trulicity application into [employer] and [employer], [facility], and [place] application into [employer].   Patient Self Care Activities:  . Self administers medications as prescribed . Calls provider office for new concerns or questions  Please see past updates  related to this goal by clicking on the "Past Updates" button in the selected goal          Plan:  - Will pass application materials along to [employer], CPhT for follow up with the companies  - Will outreach patient in 3-5 weeks for continued medication management support  Catie Darnelle Maffucci, PharmD Clinical Pharmacist [place] [phone]

## 2019-05-10 NOTE — Patient Instructions (Signed)
Visit Information  Goals Addressed            This Visit's Progress     Patient Stated   . "We can't afford his medications" (pt-stated)       Current Barriers:  . T2DM, well controlled with A1c 6.4% in 08/2018; though patient's wife notes that sugars have been higher recently. Managed by [name] at [facility] . Collaborated with [name] - will apply for Trulicity patient assistance through [facility], and Jardiance patient assistance through [employer] (patient does not qualify for [employer] assistance for [employer])  Pharmacist Clinical Goal(s):  [name] Over the next 90 days, patient will work with PharmD and primary care provider to address needs related to optimized medication management  Interventions: . Received provider and patient portions of applications, as well as patient financial information and insurance card copy. Faxed Trulicity application into [employer] and [employer], [employer] [facility], and [place] application into [employer].   Patient Self Care Activities:  . Self administers medications as prescribed . Calls provider office for new concerns or questions  Please see past updates related to this goal by clicking on the "Past Updates" button in the selected goal         The patient verbalized understanding of instructions provided today and declined a print copy of patient instruction materials.     Plan:  - Will pass application materials along to [employer], CPhT for follow up with the companies  - Will outreach patient in 3-5 weeks for continued medication management support  Catie Darnelle Maffucci, PharmD Clinical Pharmacist [place] [phone]

## 2019-05-11 ENCOUNTER — Telehealth: Payer: Self-pay

## 2019-05-17 ENCOUNTER — Other Ambulatory Visit: Payer: Self-pay | Admitting: Pharmacy Technician

## 2019-05-17 NOTE — Patient Outreach (Signed)
[place] [facility]) Care Management  05/17/2019  ALANSON HAUSMANN 1956/03/10 [id number]    Care coordination call placed to [place] in regards to Trulicity application.  Spoke to [place] who informed patient had been APPROVED 05/11/2019-10/20/2019. She informed order is in progress and transferred me over to [employer] for more information. Spoke to [place] at [employer] who informed medication is ready to be shipped but they are reaching out to patient to set up delivery. They currently have over 200 calls and it would be best if patient could call early in the morning or later in the evening to set up his delivery. She informed they are opened 8am-6pm est. The phone number for him to call is either [name] at [phone] and be transferred or [place] directed at [phone].  Care coordination call also placed to [facility] at [facility] in regards to patient's Jardiance application. Jasmine informed they are running behind in processing orders and to check back after the 10th business day. Will followup with BI in 5-7 business days.  Will outreach patient with this information.  Quinlyn Tep P. Denisha Hoel, [place] Management [phone]

## 2019-05-17 NOTE — Patient Outreach (Signed)
[place] [facility]) Care Management  05/17/2019  [name] 1956/01/04 [id number]    ADDENDUM  Unsuccessful outreach call placed to patient in regards to [place] application for Trulicity.  Unfortunately patient did not answer the phone, HIPAA compliant voicemail left.  Was calling patient to inform him to call into [name] to set up delivery of the Trulicity. He can call [name] at [phone] or [facility] at [phone].  Will route note to embedded [facility] RPh Catie Darnelle Maffucci to inquire if she can send the patient a [employer] as well.  Will attempt 2nd outreach in 3-7 business days.  Bushra Denman P. Fox Salminen, [place] Management [phone]

## 2019-05-18 ENCOUNTER — Ambulatory Visit: Payer: Self-pay | Admitting: Pharmacist

## 2019-05-18 DIAGNOSIS — E1169 Type 2 diabetes mellitus with other specified complication: Secondary | ICD-10-CM

## 2019-05-18 NOTE — Chronic Care Management (AMB) (Signed)
  Chronic Care Management   Follow Up Note   05/18/2019 Name: [name] MRN: [id number] DOB: 12-11-1955  Referred by: Guadalupe Maple, MD Reason for referral : Chronic Care Management (Medication Management)   [name] is a 63 y.o. year old male who is a primary care patient of Crissman, Jeannette How, MD. The CCM team was consulted for assistance with chronic disease management and care coordination needs.    Spoke to patient's wife today regarding patient assistance program updates.   Review of patient status, including review of consultants reports, relevant laboratory and other test results, and collaboration with appropriate care team members and the patient's provider was performed as part of comprehensive patient evaluation and provision of chronic care management services.    Goals Addressed            This Visit's Progress     Patient Stated   . "We can't afford his medications" (pt-stated)       Current Barriers:  . T2DM, well controlled with A1c 6.4% in 08/2018; though patient's wife notes that sugars have been higher recently. Managed by [name] at [facility] . Collaborated with [name] - applied for Trulicity and Jardiance patient assistance. Per Sharee Pimple [place] yesterday, patient was APPROVED for Trulicity, still awaiting decision for Jardiance.   Pharmacist Clinical Goal(s):  [name] Over the next 90 days, patient will work with PharmD and primary care provider to address needs related to optimized medication management  Interventions: . Contacted patient's wife, instructed on how to contact [employer] to schedule shipping (see note from [employer] noting details)  Patient Self Care Activities:  . Self administers medications as prescribed . Calls provider office for new concerns or questions  Please see past updates related to this goal by clicking on the "Past Updates" button in the selected goal          Plan:  - Will contact  patient/his wife in 3-4 weeks to ensure medications were received  Catie Darnelle Maffucci, PharmD Clinical Pharmacist [place] [phone]

## 2019-05-18 NOTE — Patient Instructions (Signed)
Visit Information  Goals Addressed            This Visit's Progress     Patient Stated   . "We can't afford his medications" (pt-stated)       Current Barriers:  . T2DM, well controlled with A1c 6.4% in 08/2018; though patient's wife notes that sugars have been higher recently. Managed by [name] at [facility] . Collaborated with [name] - applied for Trulicity and Jardiance patient assistance. Per Sharee Pimple [place] yesterday, patient was APPROVED for Trulicity, still awaiting decision for Jardiance.   Pharmacist Clinical Goal(s):  [name] Over the next 90 days, patient will work with PharmD and primary care provider to address needs related to optimized medication management  Interventions: . Contacted patient's wife, instructed on how to contact [employer] to schedule shipping (see note from [employer] noting details)  Patient Self Care Activities:  . Self administers medications as prescribed . Calls provider office for new concerns or questions  Please see past updates related to this goal by clicking on the "Past Updates" button in the selected goal         The patient verbalized understanding of instructions provided today and declined a print copy of patient instruction materials.   Plan:  - Will contact patient/his wife in 3-4 weeks to ensure medications were received  Catie Darnelle Maffucci, PharmD Clinical Pharmacist [place] [phone]

## 2019-05-20 ENCOUNTER — Other Ambulatory Visit: Payer: Self-pay | Admitting: Pharmacy Technician

## 2019-05-20 NOTE — Patient Outreach (Signed)
[place] [facility]) Care Management  05/20/2019  CHARLIS HARNER 10/31/1955 [id number]   Successful outreach call placed to patient in regards to [place] application for Trulicity and BI application for Jardiance.  Spoke to patient's wife Dorian Pod who informed she and her husband have not had the chance to call and schedule delivery of patient's Trulicity. She informed she had the numbers that embedded [facility] RPh Catie Darnelle Maffucci provided. Informed patient to call [name] first and asked to be transferred to [employer] or to call first thing in the morning or last thing in the evening as hold times can be rather long. Patient verbalized understanding and informed she would try and call today or Monday.  Informed patient's wife that I would update sometime next week on the status of the BI application for Jardiance as it is still being processed. Patient verbalized understanding.  Will followup with patient in 5-10 business days.  Emya Picado P. Apryl Brymer, [place] Management [phone]

## 2019-05-22 ENCOUNTER — Other Ambulatory Visit: Payer: Self-pay | Admitting: Family Medicine

## 2019-05-22 NOTE — Telephone Encounter (Signed)
Requested medication (s) are due for refill today: yes  Requested medication (s) are on the active medication list: no  Last refill:  02/28/19  Future visit scheduled: no  Notes to clinic:  Rx expired 04/21/19   Requested Prescriptions  Pending Prescriptions Disp Refills   naproxen (NAPROSYN) 500 MG tablet [Pharmacy Med Name: NAPROXEN 500 MG TABLET] 180 tablet 0    Sig: TAKE 1 TABLET (500 MG TOTAL) BY MOUTH 2 (TWO) TIMES DAILY WITH A MEAL.     Analgesics:  NSAIDS Failed - 05/22/2019  9:51 AM      Failed - HGB in normal range and within 360 days    Hemoglobin  Date Value Ref Range Status  01/06/2018 14.8 13.0 - 17.7 g/dL Final         Passed - Cr in normal range and within 360 days    Creatinine  Date Value Ref Range Status  12/07/2012 1.11 0.60 - 1.30 mg/dL Final   Creatinine, Ser  Date Value Ref Range Status  09/06/2018 0.95 0.76 - 1.27 mg/dL Final         Passed - Patient is not pregnant      Passed - Valid encounter within last 12 months    Recent Outpatient Visits          4 months ago Encounter for screening colonoscopy   [place] Guadalupe Maple, MD   8 months ago Diabetes mellitus without complication ([place])   [facility] Crissman, Jeannette How, MD   1 year ago Diabetes mellitus without complication ([place])   [facility] Crissman, Jeannette How, MD   1 year ago Diabetes mellitus without complication ([place])   [facility] Crissman, Jeannette How, MD   1 year ago Diabetes mellitus without complication [facility])   [facility] Crissman, Jeannette How, MD

## 2019-05-25 ENCOUNTER — Other Ambulatory Visit: Payer: Self-pay | Admitting: Pharmacy Technician

## 2019-05-25 NOTE — Patient Outreach (Signed)
[place] [facility]) Care Management  05/25/2019  RAMAL ECKHARDT 31-Aug-1956 [id number]   ADDENDUM  Successful outreach call placed to patient in regards to [facility] application for Jardiance and [name] appliciaton for [name].  Spoke to patient and patient's wife, HIPAA identifiers verified.  Informed patient that he had been approved for his Vania Rea and to expect a delivery in the next 5-10 business days. Also informed him that [place] is going to be contacting him today per Andria Frames the representative I  Spoke to in order to set up his delivery shipment. Informed them that the number would be coming from an [phone] number from [place], [place]. They verbalized understanding.  Will followup with patient in 10-14 business days to inquire if medications were received.  Raevin Wierenga P. Khristin Keleher, [employer] Management [phone]

## 2019-05-25 NOTE — Patient Outreach (Signed)
[place] [facility]) Care Management  05/25/2019  BREON REHM 01/04/1956 [id number]   Care coordination call placed to [place] in regards to patient's [name] application with Trulicity and call placed to [facility] in regards to patient's Jardiance application.  Spoke to Diablo Grande at [employer] who says patient's medication is ready for shipping. She informed she would reach out to patient as soon as our call disconnected to schedule delivery. Confirmed with Andria Frames phone numbers on application.  Spoke to [place] at [facility] who informed patient had been APPROVED 05/16/2019-10/20/2019. She informed the medication was shipped out on 05/23/2019 with tracking number of [id number]. She informed next refill would be due 08/22/2019. She informed it would take 7-10 days for medication to arrive to patient's home.  Will followup with patient in 10-14 business days to inquire if both medications were received.  Foye Damron P. Adisa Vigeant, [place] Management [phone]

## 2019-06-08 ENCOUNTER — Ambulatory Visit (INDEPENDENT_AMBULATORY_CARE_PROVIDER_SITE_OTHER): Payer: PPO | Admitting: Pharmacist

## 2019-06-08 ENCOUNTER — Other Ambulatory Visit: Payer: Self-pay | Admitting: Pharmacy Technician

## 2019-06-08 DIAGNOSIS — E7800 Pure hypercholesterolemia, unspecified: Secondary | ICD-10-CM

## 2019-06-08 DIAGNOSIS — E1169 Type 2 diabetes mellitus with other specified complication: Secondary | ICD-10-CM | POA: Diagnosis not present

## 2019-06-08 NOTE — Patient Outreach (Signed)
[facility]) Care Management  06/08/2019  [name] 01/23/1956 [id number]    Received inbasket message from embedded [facility] RPh Catie Darnelle Maffucci in regards to [name] application for [name] for [name].  The message informed that the patient received 90 days supply of Jardiance and 4 boxes or 120 days supply of Trulicity. The message also informed that the patient was made aware of how to obtain refills.  Will route note to embedded [facility] RPh Catie Darnelle Maffucci that I am removing myself from care team as patient assistance has been completed.  [name] P. [name], [place] Management [phone]

## 2019-06-08 NOTE — Patient Instructions (Addendum)
Visit Information  Goals Addressed            This Visit's Progress     Patient Stated   . COMPLETED: "We can't afford his medications" (pt-stated)       Current Barriers:  . Financial concerns with affording brand name medications . Patient APPROVED for Jardiance assistance through [employer] and Musician through [employer]):  [name] Over the next 90 days, patient will work with PharmD and primary care provider to address needs related to optimized medication management  Interventions: . Contacted patient's wife; she confirmed she received both medications in the mail. Reviewed how to request refills; she verbalized understanding.   Patient Self Care Activities:  . Self administers medications as prescribed . Calls provider office for new concerns or questions  Please see past updates related to this goal by clicking on the "Past Updates" button in the selected goal      . "We want to make sure his medications are OK" (pt-stated)       Current Barriers:  . Diabetes: controlled; most recent A1c 6.7% on 03/09/2019 per Care Everywhere  . Current antihyperglycemic regimen: Jardiance 25 mg daily, Trulicity 8.46 mg once weekly, metformin 1000 mg BID o Receiving Jardiance and Trulicity through 65/99/3570 . Current blood glucose readings: remain at goal per wife's report . Cardiovascular risk reduction: o Current hypertensive regimen: n/a; BP well controlled at <130/80 at office visits; UACR <30 on last check 04/2018 o Current hyperlipidemia regimen: atorvastatin 40 mg daily; LDL at goal <70 on last check 08/2018  Pharmacist Clinical Goal(s):  [name] Over the next 90 days, patient with work with PharmD and primary care provider to address optimized self-management of diabetes  Interventions: . Cost concerns with therapy relieved through collaboration with Dr. Sherren Mocha office on patient assistance program paperwork.  . Patient has my contact  information for any questions or concerns . Per outside fill history, patient compliant to atorvastatin refills.   Patient Self Care Activities:  . Patient will check blood glucose daily , document, and provide at future appointments . Patient will take medications as prescribed . Patient will contact provider with any episodes of hypoglycemia . Patient will report any questions or concerns to provider   Please see past updates related to this goal by clicking on the "Past Updates" button in the selected goal          The patient verbalized understanding of instructions provided today and declined a print copy of patient instruction materials.    Plan:  - Will outreach patient in 6-8 weeks for continued medication management support  Catie Darnelle Maffucci, PharmD Clinical Pharmacist [place] [phone]

## 2019-06-08 NOTE — Chronic Care Management (AMB) (Signed)
Chronic Care Management   Follow Up Note   06/08/2019 Name: ANTWANE GROSE MRN: [id number] DOB: [birth year]  Referred by: Guadalupe Maple, MD Reason for referral : Chronic Care Management (Medication Management)   EUSTACE HUR is a 63 y.o. year old male who is a primary care patient of Criss[facility], Jeannette How, MD. The CCM team was consulted for assistance with chronic disease management and care coordination needs.    Contacted patient's wife to discuss medication access concerns.   Review of patient status, including review of consultants reports, relevant laboratory and other test results, and collaboration with appropriate care team members and the patient's provider was performed as part of comprehensive patient evaluation and provision of chronic care management services.    SDOH (Social Determinants of Health) screening performed today: Financial Strain . See Care Plan for related entries.   Outpatient Encounter Medications as of 06/08/2019  Medication Sig Note  . aspirin EC 81 MG tablet Take 81 mg by mouth daily.   [name] atorvastatin (LIPITOR) 40 MG tablet Take 1 tablet (40 mg total) by mouth daily.   . Blood Glucose Monitoring Suppl (ACCU-CHEK AVIVA PLUS) w/Device KIT Use kit to check sugar daily   . Cholecalciferol (VITAMIN D-3 PO) Take 5,000 Units/day by mouth daily.    . clonazePAM (KLONOPIN) 1 MG tablet Take 1 tablet (1 mg total) by mouth daily as needed for anxiety. 04/20/2019: 1/2 tablet   . doxycycline (VIBRA-TABS) 100 MG tablet Take 1 tablet (100 mg total) by mouth 2 (two) times daily. Caution about sunburn (Patient not taking: Reported on 04/20/2019)   . Dulaglutide (TRULICITY) 9.03 ES/9.2ZR SOPN Inject once a week   . empagliflozin (JARDIANCE) 25 MG TABS tablet Take 25 mg by mouth daily.   . metFORMIN (GLUCOPHAGE) 500 MG tablet Take 2 tablets (1,000 mg total) by mouth 2 (two) times daily.   . Multiple Vitamins-Minerals (MULTIVITAMIN ADULT PO) Take by mouth.   . naproxen (NAPROSYN)  500 MG tablet TAKE 1 TABLET (500 MG TOTAL) BY MOUTH 2 (TWO) TIMES DAILY WITH A MEAL.   [name] omeprazole (PRILOSEC) 40 MG capsule Take 40 mg by mouth daily.   . ONE TOUCH ULTRA TEST test strip USE TO CHECK BLOOD SUGAR LEVELS THREE TIMES DAILY. DX:E11.9   . pantoprazole (PROTONIX) 40 MG tablet TAKE 1 TABLET BY MOUTH EVERY DAY (Patient taking differently: 2 (two) times daily. )   . sucralfate (CARAFATE) 1 g tablet Take 1 tablet (1 g total) by mouth 2 (two) times daily.   . TURMERIC PO Take by mouth.    No facility-administered encounter medications on file as of 06/08/2019.      Goals Addressed            This Visit's Progress     Patient Stated   . COMPLETED: "We can't afford his medications" (pt-stated)       Current Barriers:  . Financial concerns with affording brand name medications . Patient APPROVED for Jardiance assistance through [facility] and Musician through [employer]):  [name] Over the next 90 days, patient will work with PharmD and primary care provider to address needs related to optimized medication management  Interventions: . Contacted patient's wife; she confirmed she received both medications in the mail. Reviewed how to request refills; she verbalized understanding.   Patient Self Care Activities:  . Self administers medications as prescribed . Calls provider office for new concerns or questions  Please see past updates related  to this goal by clicking on the "Past Updates" button in the selected goal      . "We want to make sure his medications are OK" (pt-stated)       Current Barriers:  . Diabetes: controlled; most recent A1c 6.7% on 03/09/2019 per Care Everywhere  . Current antihyperglycemic regimen: Jardiance 25 mg daily, Trulicity 7.26 mg once weekly, metformin 1000 mg BID o Receiving Jardiance and Trulicity through 20/35/5974 . Current blood glucose readings: remain at goal per wife's report . Cardiovascular risk  reduction: o Current hypertensive regimen: n/a; BP well controlled at <130/80 at office visits; UACR <30 on last check 04/2018 o Current hyperlipidemia regimen: atorvastatin 40 mg daily; LDL at goal <70 on last check 08/2018  Pharmacist Clinical Goal(s):  [name] Over the next 90 days, patient with work with PharmD and primary care provider to address optimized self-management of diabetes  Interventions: . Cost concerns with therapy relieved through collaboration with Dr. Sherren Mocha office on patient assistance program paperwork.  . Patient has my contact information for any questions or concerns . Per outside fill history, patient compliant to atorvastatin refills.   Patient Self Care Activities:  . Patient will check blood glucose daily , document, and provide at future appointments . Patient will take medications as prescribed . Patient will contact provider with any episodes of hypoglycemia . Patient will report any questions or concerns to provider   Please see past updates related to this goal by clicking on the "Past Updates" button in the selected goal            Plan:  - Will outreach patient in 6-8 weeks for continued medication management support  Catie Darnelle Maffucci, PharmD Clinical Pharmacist [place] [phone]

## 2019-07-20 ENCOUNTER — Ambulatory Visit (INDEPENDENT_AMBULATORY_CARE_PROVIDER_SITE_OTHER): Payer: PPO | Admitting: Family Medicine

## 2019-07-20 ENCOUNTER — Encounter: Payer: Self-pay | Admitting: Family Medicine

## 2019-07-20 ENCOUNTER — Other Ambulatory Visit: Payer: Self-pay

## 2019-07-20 VITALS — BP 107/71 | HR 79 | Temp 98.3°F | Ht 71.0 in | Wt 162.0 lb

## 2019-07-20 DIAGNOSIS — F5102 Adjustment insomnia: Secondary | ICD-10-CM

## 2019-07-20 DIAGNOSIS — T148XXA Other injury of unspecified body region, initial encounter: Secondary | ICD-10-CM

## 2019-07-20 DIAGNOSIS — E1169 Type 2 diabetes mellitus with other specified complication: Secondary | ICD-10-CM

## 2019-07-20 DIAGNOSIS — S6000XA Contusion of unspecified finger without damage to nail, initial encounter: Secondary | ICD-10-CM | POA: Diagnosis not present

## 2019-07-20 DIAGNOSIS — R21 Rash and other nonspecific skin eruption: Secondary | ICD-10-CM | POA: Diagnosis not present

## 2019-07-20 DIAGNOSIS — E7800 Pure hypercholesterolemia, unspecified: Secondary | ICD-10-CM

## 2019-07-20 MED ORDER — JARDIANCE 25 MG PO TABS: 25.0000 mg | ORAL_TABLET | Freq: Every day | ORAL | 1 refills | Status: DC

## 2019-07-20 MED ORDER — CLONAZEPAM 1 MG PO TABS: 1.0000 mg | ORAL_TABLET | Freq: Every day | ORAL | 1 refills | Status: DC | PRN

## 2019-07-20 NOTE — Progress Notes (Signed)
BP 107/71 (BP Location: Left Arm, Patient Position: Sitting, Cuff Size: Normal)   Pulse 79   Temp 98.3 F (36.8 C) (Oral)   Ht 5\' 11"  (1.803 m)   Wt 162 lb (73.5 kg)   SpO2 97%   BMI 22.59 kg/m    Subjective:    Patient ID: [name], male    DOB: [birth year], 63 y.o.   MRN: [id number]  HPI: [name] is a 63 y.o. male  Chief Complaint  Patient presents with  . Sunburn    Wife states she thinks patient got sun poisoning at the beach.   . Finger Problem    Finger nail problem. Patient smashed fingers.   Patient presenting today for redness and tenderness b/l shins that he believes to be sun poisoning. Happened first day of their recent 10 day beach trip. No blistering, warmth, swelling in legs. Not using anything OTC for it. Bumped his left shin on something after that redness came up and now has a scabbed area in the middle of left shin. No drainage, seems to be healing well. Cleaning it with peroxide occasionally.   Smashed 3 fingers in a door as well about 10 days ago during this trip. Right tip of middle finger still swollen and bruised, and all 3 affected fingers with hematomas under nail. No numbness or tingling or limitations to ROM in affected fingers.   Also significantly overdue for chronic condition management and labs.   Using klonopin about 12 times per month for anxiety and sleep. Sometimes more frequently depending on what's going on. States this is doing very well for him. No side effects.   HLD - on lipitor, no issues with the medication. Taking faithfully. Denies CP, SOB, claudication, myalgias.   DM - currently taking metformin, jardiance and trulicity, tolerating them well and taking daily. Has not been very careful with diet lately but staying active. Does not regularly check BSs at home.   Relevant past medical, surgical, family and social history reviewed and updated as indicated. Interim medical history since our last visit reviewed. Allergies  and medications reviewed and updated.  Review of Systems  Per HPI unless specifically indicated above     Objective:    BP 107/71 (BP Location: Left Arm, Patient Position: Sitting, Cuff Size: Normal)   Pulse 79   Temp 98.3 F (36.8 C) (Oral)   Ht 5\' 11"  (1.803 m)   Wt 162 lb (73.5 kg)   SpO2 97%   BMI 22.59 kg/m   Wt Readings from Last 3 Encounters:  07/20/19 162 lb (73.5 kg)  01/13/19 172 lb (78 kg)  09/06/18 174 lb (78.9 kg)    Physical Exam Vitals signs and nursing note reviewed.  Constitutional:      Appearance: Normal appearance.  HENT:     Head: Atraumatic.  Eyes:     Extraocular Movements: Extraocular movements intact.     Conjunctiva/sclera: Conjunctivae normal.  Neck:     Musculoskeletal: Normal range of motion and neck supple.  Cardiovascular:     Rate and Rhythm: Normal rate and regular rhythm.  Pulmonary:     Effort: Pulmonary effort is normal.     Breath sounds: Normal breath sounds.  Musculoskeletal: Normal range of motion.     Comments: Right third digit fingertip swollen and mildly bruised, ROM intact Left 2nd and 4th digits mildly swollen distally with bruising under nailbeds  Skin:    General: Skin is warm and dry.  Findings: Erythema (erythema b/l anterior lower legs) present.     Comments: 0.5 cm scabbed lesion anterior left lower leg, no drainage, warmth, edema. Appears to be healing well  Neurological:     General: No focal deficit present.     Mental Status: He is oriented to person, place, and time.  Psychiatric:        Mood and Affect: Mood normal.        Thought Content: Thought content normal.        Judgment: Judgment normal.     Results for orders placed or performed in visit on 03/24/19  HM DIABETES EYE EXAM  Result Value Ref Range   HM Diabetic Eye Exam No Retinopathy No Retinopathy      Assessment & Plan:   Problem List Items Addressed This Visit      Endocrine   Diabetes mellitus associated with hormonal etiology  (HCC) - Primary    Recheck A1C and adjust as needed. Continue current regimen, lifestyle modifications      Relevant Medications   empagliflozin (JARDIANCE) 25 MG TABS tablet   Other Relevant Orders   Comprehensive metabolic panel   HgB A1c     Other   Hyperlipemia    Recheck lipids, adjust as needed. COntinue current regimen      Relevant Orders   Lipid Panel w/o Chol/HDL Ratio   Insomnia due to stress    Has been on klonopin for years for this, discussed long term the safety risks with this regimen and that there are some safer things we could transition over to. Will continue this regimen for now and pt will consider options discussed. Continue rare cautious prn use      Relevant Medications   clonazePAM (KLONOPIN) 1 MG tablet    Other Visit Diagnoses    Rash       Suspect related to sun exposure. Keep moisturized, monitor for improvement. No blistering or infection noted   Skin abrasion       Left lower leg, healing well. Apply antibacterial ointment and keep covered. F/u if not healing well   Contusion of finger without damage to nail, unspecified finger, initial encounter       3 fingers between both hands affected, bruising under nailbeds but no damage and ROM, neurovascularly intact. X-ray if not healing well over next few weeks       Follow up plan: Return in about 6 months (around 01/17/2020).

## 2019-07-20 NOTE — Assessment & Plan Note (Signed)
Recheck lipids, adjust as needed. COntinue current regimen

## 2019-07-20 NOTE — Assessment & Plan Note (Signed)
Has been on klonopin for years for this, discussed long term the safety risks with this regimen and that there are some safer things we could transition over to. Will continue this regimen for now and pt will consider options discussed. Continue rare cautious prn use

## 2019-07-20 NOTE — Assessment & Plan Note (Signed)
Recheck A1C and adjust as needed. Continue current regimen, lifestyle modifications

## 2019-07-25 ENCOUNTER — Other Ambulatory Visit: Payer: PPO

## 2019-07-25 ENCOUNTER — Other Ambulatory Visit: Payer: Self-pay

## 2019-07-25 DIAGNOSIS — E1169 Type 2 diabetes mellitus with other specified complication: Secondary | ICD-10-CM | POA: Diagnosis not present

## 2019-07-25 DIAGNOSIS — E7800 Pure hypercholesterolemia, unspecified: Secondary | ICD-10-CM | POA: Diagnosis not present

## 2019-07-26 LAB — COMPREHENSIVE METABOLIC PANEL
ALT: 17 IU/L (ref 0–44)
AST: 15 IU/L (ref 0–40)
Albumin/Globulin Ratio: 1.9 (ref 1.2–2.2)
Albumin: 4.3 g/dL (ref 3.8–4.8)
Alkaline Phosphatase: 82 IU/L (ref 39–117)
BUN/Creatinine Ratio: 19 (ref 10–24)
BUN: 20 mg/dL (ref 8–27)
Bilirubin Total: 0.4 mg/dL (ref 0.0–1.2)
CO2: 23 mmol/L (ref 20–29)
Calcium: 9.2 mg/dL (ref 8.6–10.2)
Chloride: 101 mmol/L (ref 96–106)
Creatinine, Ser: 1.05 mg/dL (ref 0.76–1.27)
GFR calc Af Amer: 86 mL/min/{1.73_m2} (ref >59)
GFR calc non Af Amer: 74 mL/min/{1.73_m2} (ref >59)
Globulin, Total: 2.3 g/dL (ref 1.5–4.5)
Glucose: 82 mg/dL (ref 65–99)
Potassium: 4 mmol/L (ref 3.5–5.2)
Sodium: 139 mmol/L (ref 134–144)
Total Protein: 6.6 g/dL (ref 6.0–8.5)

## 2019-07-26 LAB — LIPID PANEL W/O CHOL/HDL RATIO
Cholesterol, Total: 146 mg/dL (ref 100–199)
HDL: 41 mg/dL (ref >39)
LDL Chol Calc (NIH): 76 mg/dL (ref 0–99)
Triglycerides: 171 mg/dL — ABNORMAL HIGH (ref 0–149)
VLDL Cholesterol Cal: 29 mg/dL (ref 5–40)

## 2019-07-26 LAB — HEMOGLOBIN A1C
Est. average glucose Bld gHb Est-mCnc: 146 mg/dL
Hgb A1c MFr Bld: 6.7 % — ABNORMAL HIGH (ref 4.8–5.6)

## 2019-08-14 ENCOUNTER — Other Ambulatory Visit: Payer: Self-pay | Admitting: Family Medicine

## 2019-08-15 NOTE — Telephone Encounter (Signed)
Requested medication (s) are due for refill today: no  Requested medication (s) are on the active medication list: no  Last refill:  05/23/2019  Future visit scheduled: yes  Notes to clinic: medication was discontinued    Requested Prescriptions  Pending Prescriptions Disp Refills   naproxen (NAPROSYN) 500 MG tablet [Pharmacy Med Name: NAPROXEN 500 MG TABLET] 180 tablet 0    Sig: TAKE 1 TABLET (500 MG TOTAL) BY MOUTH 2 (TWO) TIMES DAILY WITH A MEAL.     Analgesics:  NSAIDS Failed - 08/14/2019 10:01 AM      Failed - HGB in normal range and within 360 days    Hemoglobin  Date Value Ref Range Status  01/06/2018 14.8 13.0 - 17.7 g/dL Final         Passed - Cr in normal range and within 360 days    Creatinine  Date Value Ref Range Status  12/07/2012 1.11 0.60 - 1.30 mg/dL Final   Creatinine, Ser  Date Value Ref Range Status  07/25/2019 1.05 0.76 - 1.27 mg/dL Final         Passed - Patient is not pregnant      Passed - Valid encounter within last 12 months    Recent Outpatient Visits          3 weeks ago Diabetes mellitus associated with hormonal etiology [facility])   [place], [place], PA-C   7 months ago Encounter for screening colonoscopy   [facility], Jeannette How, MD   11 months ago Diabetes mellitus without complication [facility])   [facility], Jeannette How, MD   1 year ago Diabetes mellitus without complication [facility])   [place], Jeannette How, MD   1 year ago Diabetes mellitus without complication [facility])   [place], MD      Future Appointments            In 5 months Orene Desanctis, Lilia Argue, PA-C [facility]

## 2019-08-19 ENCOUNTER — Telehealth: Payer: Self-pay | Admitting: Family Medicine

## 2019-08-19 NOTE — Telephone Encounter (Signed)
Called and left a message asking patient to return my call.  

## 2019-08-19 NOTE — Telephone Encounter (Signed)
He shouldn't be out of any of his diabetes medications, can someone please see specifically what he needs? He was just seen last month and is not due for f/u

## 2019-08-19 NOTE — Telephone Encounter (Signed)
Called to sched AWV-I pt needs prescriptions refilled for Diabetes supplies and medication. Wanted to know if he needs to come in for a visit or whether he can do an e-visit. Please call [phone]  Jill Alexanders

## 2019-08-22 NOTE — Telephone Encounter (Signed)
Pt calling back. Pt leaving around 11am today so if you call after that please call wife's cell number   Cell# [phone]

## 2019-08-22 NOTE — Telephone Encounter (Signed)
Patient needs glucose monitor, will write up and get ready for you to sign, will be in your folder.

## 2019-08-22 NOTE — Telephone Encounter (Signed)
Signed.

## 2019-08-31 ENCOUNTER — Ambulatory Visit: Payer: PPO

## 2019-09-07 ENCOUNTER — Ambulatory Visit: Payer: Self-pay | Admitting: Pharmacist

## 2019-09-07 ENCOUNTER — Other Ambulatory Visit: Payer: Self-pay

## 2019-09-07 ENCOUNTER — Ambulatory Visit (INDEPENDENT_AMBULATORY_CARE_PROVIDER_SITE_OTHER): Payer: PPO

## 2019-09-07 DIAGNOSIS — E1169 Type 2 diabetes mellitus with other specified complication: Secondary | ICD-10-CM

## 2019-09-07 DIAGNOSIS — Z23 Encounter for immunization: Secondary | ICD-10-CM

## 2019-09-07 NOTE — Chronic Care Management (AMB) (Signed)
Chronic Care Management   Follow Up Note   09/07/2019 Name: [name] MRN: [id number] DOB: 1956/07/26  Referred by: Guadalupe Maple, MD Reason for referral : Chronic Care Management (Medication Management)   [name] is a 63 y.o. year old male who is a primary care patient of Crissman, Jeannette How, MD. The CCM team was consulted for assistance with chronic disease management and care coordination needs.    Met with patient face to face today.   Review of patient status, including review of consultants reports, relevant laboratory and other test results, and collaboration with appropriate care team members and the patient's provider was performed as part of comprehensive patient evaluation and provision of chronic care management services.    SDOH (Social Determinants of Health) screening performed today: Financial Strain . See Care Plan for related entries.   Outpatient Encounter Medications as of 09/07/2019  Medication Sig  . aspirin EC 81 MG tablet Take 81 mg by mouth daily.  [name] atorvastatin (LIPITOR) 40 MG tablet Take 1 tablet (40 mg total) by mouth daily.  . Blood Glucose Monitoring Suppl (ACCU-CHEK AVIVA PLUS) w/Device KIT Use kit to check sugar daily  . Cholecalciferol (VITAMIN D-3 PO) Take 5,000 Units/day by mouth daily.   . clonazePAM (KLONOPIN) 1 MG tablet Take 1 tablet (1 mg total) by mouth daily as needed for anxiety.  . Dulaglutide (TRULICITY) 8.14 GY/1.8HU SOPN Inject once a week  . empagliflozin (JARDIANCE) 25 MG TABS tablet Take 25 mg by mouth daily.  . metFORMIN (GLUCOPHAGE) 500 MG tablet Take 2 tablets (1,000 mg total) by mouth 2 (two) times daily.  . Multiple Vitamins-Minerals (MULTIVITAMIN ADULT PO) Take by mouth.  [name] omeprazole (PRILOSEC) 40 MG capsule Take 40 mg by mouth daily.  . ONE TOUCH ULTRA TEST test strip USE TO CHECK BLOOD SUGAR LEVELS THREE TIMES DAILY. DX:E11.9  . pantoprazole (PROTONIX) 40 MG tablet TAKE 1 TABLET BY MOUTH EVERY DAY (Patient taking  differently: 2 (two) times daily. )  . sucralfate (CARAFATE) 1 g tablet Take 1 tablet (1 g total) by mouth 2 (two) times daily.  . vitamin E 400 UNIT capsule Take 400 Units by mouth daily.   No facility-administered encounter medications on file as of 09/07/2019.      Goals Addressed            This Visit's Progress     Patient Stated   . "We want to make sure his medications are OK" (pt-stated)       Current Barriers:  . Diabetes: controlled; most recent A1c 6.7% . Current antihyperglycemic regimen: Jardiance 25 mg daily, Trulicity 3.14 mg once weekly, metformin 1000 mg BID o Receiving Jardiance and Trulicity through 97/11/6376- brings the re-application for 5885 for Jardiance today . Current blood glucose readings: remain at goal per wife's report . Cardiovascular risk reduction: o Current hypertensive regimen: n/a; BP well controlled at <130/80 at office visits; UACR <30 on last check  o Current hyperlipidemia regimen: atorvastatin 40 mg daily; LDL not quite at goal <70  Pharmacist Clinical Goal(s):  [name] Over the next 90 days, patient with work with PharmD and primary care provider to address optimized self-management of diabetes  Interventions: . Congratulated patient on continued maintenance of A1c. . Will collaborate w/ patient and [name] for completion of 0277 application for Jardiance assistance. Will collaborate w/ Susy Frizzle, CPhT for follow up.  Patient Self Care Activities:  . Patient will check blood glucose daily , document, and  provide at future appointments . Patient will take medications as prescribed . Patient will contact provider with any episodes of hypoglycemia . Patient will report any questions or concerns to provider   Please see past updates related to this goal by clicking on the "Past Updates" button in the selected goal           Plan: - Will collaborate w/ patient, provider, and CPhT as above - Will outreach patient as scheduled for  continued medication management support  Catie Darnelle Maffucci, PharmD Clinical Pharmacist [place] [phone]

## 2019-09-07 NOTE — Patient Instructions (Signed)
Visit Information  Goals Addressed            This Visit's Progress     Patient Stated   . "We want to make sure his medications are OK" (pt-stated)       Current Barriers:  . Diabetes: controlled; most recent A1c 6.7% on 03/09/2019 per Care Everywhere  . Current antihyperglycemic regimen: Jardiance 25 mg daily, Trulicity 9.73 mg once weekly, metformin 1000 mg BID o Receiving Jardiance and Trulicity through 53/29/9242- brings the re-application for 6834 for Jardiance today . Current blood glucose readings: remain at goal per wife's report . Cardiovascular risk reduction: o Current hypertensive regimen: n/a; BP well controlled at <130/80 at office visits; UACR <30 on last check 04/2018 o Current hyperlipidemia regimen: atorvastatin 40 mg daily; LDL at goal <70 on last check 08/2018  Pharmacist Clinical Goal(s):  [name] Over the next 90 days, patient with work with PharmD and primary care provider to address optimized self-management of diabetes  Interventions: . Will collaborate w/ patient and [name] for completion of 1962 application for Jardiance assistance. Will collaborate w/ Susy Frizzle, CPhT for follow up.  Patient Self Care Activities:  . Patient will check blood glucose daily , document, and provide at future appointments . Patient will take medications as prescribed . Patient will contact provider with any episodes of hypoglycemia . Patient will report any questions or concerns to provider   Please see past updates related to this goal by clicking on the "Past Updates" button in the selected goal          The patient verbalized understanding of instructions provided today and declined a print copy of patient instruction materials.   Plan: - Will collaborate w/ patient, provider, and CPhT as above - Will outreach patient as scheduled for continued medication management support  Catie Darnelle Maffucci, PharmD Clinical Pharmacist [place] [phone]

## 2019-09-08 DIAGNOSIS — E1169 Type 2 diabetes mellitus with other specified complication: Secondary | ICD-10-CM | POA: Diagnosis not present

## 2019-09-08 DIAGNOSIS — E785 Hyperlipidemia, unspecified: Secondary | ICD-10-CM | POA: Diagnosis not present

## 2019-09-08 DIAGNOSIS — E119 Type 2 diabetes mellitus without complications: Secondary | ICD-10-CM | POA: Diagnosis not present

## 2019-09-09 ENCOUNTER — Ambulatory Visit: Payer: Self-pay | Admitting: Pharmacist

## 2019-09-09 DIAGNOSIS — E1169 Type 2 diabetes mellitus with other specified complication: Secondary | ICD-10-CM

## 2019-09-09 NOTE — Chronic Care Management (AMB) (Signed)
Chronic Care Management   Follow Up Note   09/09/2019 Name: [name] MRN: [id number] DOB: 12/17/1955  Referred by: Guadalupe Maple, MD Reason for referral : Chronic Care Management (Medication Management)   RODERT HINCH is a 63 y.o. year old male who is a primary care patient of Crissman, Jeannette How, MD. The CCM team was consulted for assistance with chronic disease management and care coordination needs.    Care coordination completed today.  Review of patient status, including review of consultants reports, relevant laboratory and other test results, and collaboration with appropriate care team members and the patient's provider was performed as part of comprehensive patient evaluation and provision of chronic care management services.    SDOH (Social Determinants of Health) screening performed today: Financial Strain . See Care Plan for related entries.   Outpatient Encounter Medications as of 09/09/2019  Medication Sig   aspirin EC 81 MG tablet Take 81 mg by mouth daily.   atorvastatin (LIPITOR) 40 MG tablet Take 1 tablet (40 mg total) by mouth daily.   Blood Glucose Monitoring Suppl (ACCU-CHEK AVIVA PLUS) w/Device KIT Use kit to check sugar daily   Cholecalciferol (VITAMIN D-3 PO) Take 5,000 Units/day by mouth daily.    clonazePAM (KLONOPIN) 1 MG tablet Take 1 tablet (1 mg total) by mouth daily as needed for anxiety.   Dulaglutide (TRULICITY) 1.44 YJ/8.5UD SOPN Inject once a week   empagliflozin (JARDIANCE) 25 MG TABS tablet Take 25 mg by mouth daily.   metFORMIN (GLUCOPHAGE) 500 MG tablet Take 2 tablets (1,000 mg total) by mouth 2 (two) times daily.   Multiple Vitamins-Minerals (MULTIVITAMIN ADULT PO) Take by mouth.   omeprazole (PRILOSEC) 40 MG capsule Take 40 mg by mouth daily.   ONE TOUCH ULTRA TEST test strip USE TO CHECK BLOOD SUGAR LEVELS THREE TIMES DAILY. DX:E11.9   pantoprazole (PROTONIX) 40 MG tablet TAKE 1 TABLET BY MOUTH EVERY DAY (Patient taking  differently: 2 (two) times daily. )   sucralfate (CARAFATE) 1 g tablet Take 1 tablet (1 g total) by mouth 2 (two) times daily.   vitamin E 400 UNIT capsule Take 400 Units by mouth daily.   No facility-administered encounter medications on file as of 09/09/2019.      Goals Addressed            This Visit's Progress     Patient Stated    "We want to make sure his medications are OK" (pt-stated)       Current Barriers:   Diabetes: controlled; most recent A1c 6.7%  Current antihyperglycemic regimen: Jardiance 25 mg daily, Trulicity 1.49 mg once weekly, metformin 1000 mg BID o Receiving Jardiance and Trulicity through 70/26/3785  Current blood glucose readings: remain at goal per wife's report  Cardiovascular risk reduction: o Current hypertensive regimen: n/a; BP well controlled at <130/80 at office visits; UACR <30 on last check  o Current hyperlipidemia regimen: atorvastatin 40 mg daily; LDL not quite at goal <70  Pharmacist Clinical Goal(s):   Over the next 90 days, patient with work with PharmD and primary care provider to address optimized self-management of diabetes  Interventions:  [place] application for [employer] assistance for 2021 to Dr. Sherren Mocha office [facility]) today. Will pass materials along to [employer], CPhT for follow up  Patient Self Care Activities:   Patient will check blood glucose daily , document, and provide at future appointments  Patient will take medications as prescribed  Patient will contact provider with  any episodes of hypoglycemia  Patient will report any questions or concerns to provider   Please see past updates related to this goal by clicking on the "Past Updates" button in the selected goal           Plan: - Will continue to collaborate w/ Susy Frizzle, CPhT for medication access  Catie Darnelle Maffucci, PharmD Clinical Pharmacist [place] [phone]

## 2019-09-09 NOTE — Patient Instructions (Signed)
Visit Information  Goals Addressed            This Visit's Progress     Patient Stated   . "We want to make sure his medications are OK" (pt-stated)       Current Barriers:  . Diabetes: controlled; most recent A1c 6.7% . Current antihyperglycemic regimen: Jardiance 25 mg daily, Trulicity 5.46 mg once weekly, metformin 1000 mg BID o Receiving Jardiance and Trulicity through 27/12/5007 . Current blood glucose readings: remain at goal per wife's report . Cardiovascular risk reduction: o Current hypertensive regimen: n/a; BP well controlled at <130/80 at office visits; UACR <30 on last check  o Current hyperlipidemia regimen: atorvastatin 40 mg daily; LDL not quite at goal <70  Pharmacist Clinical Goal(s):  [name] Over the next 90 days, patient with work with PharmD and primary care provider to address optimized self-management of diabetes  Interventions: . [place] application for [employer] for 2021 to Dr. Sherren Mocha office [facility]) today. Will pass materials along to [employer], CPhT for follow up  Patient Self Care Activities:  . Patient will check blood glucose daily , document, and provide at future appointments . Patient will take medications as prescribed . Patient will contact provider with any episodes of hypoglycemia . Patient will report any questions or concerns to provider   Please see past updates related to this goal by clicking on the "Past Updates" button in the selected goal          The patient verbalized understanding of instructions provided today and declined a print copy of patient instruction materials.    Plan: - Will continue to collaborate w/ Susy Frizzle, CPhT for medication access  Catie Darnelle Maffucci, PharmD Clinical Pharmacist [place] [phone]

## 2019-09-14 ENCOUNTER — Other Ambulatory Visit: Payer: Self-pay

## 2019-09-21 ENCOUNTER — Ambulatory Visit: Payer: Self-pay | Admitting: Pharmacist

## 2019-09-21 DIAGNOSIS — E1169 Type 2 diabetes mellitus with other specified complication: Secondary | ICD-10-CM

## 2019-09-21 NOTE — Chronic Care Management (AMB) (Signed)
Chronic Care Management   Follow Up Note   09/21/2019 Name: [name] MRN: [id number] DOB: [birth year]  Referred by: James Maple, MD Reason for referral : Chronic Care Management (Medication Management)   [name] is a 63 y.o. year old male who is a primary care patient of Crissman, Jeannette How, MD. The CCM team was consulted for assistance with chronic disease management and care coordination needs.    Care coordination completed today.   Review of patient status, including review of consultants reports, relevant laboratory and other test results, and collaboration with appropriate care team members and the patient's provider was performed as part of comprehensive patient evaluation and provision of chronic care management services.    SDOH (Social Determinants of Health) screening performed today: Financial Strain . See Care Plan for related entries.   Outpatient Encounter Medications as of 09/21/2019  Medication Sig  . aspirin EC 81 MG tablet Take 81 mg by mouth daily.  [name] atorvastatin (LIPITOR) 40 MG tablet Take 1 tablet (40 mg total) by mouth daily.  . Blood Glucose Monitoring Suppl (ACCU-CHEK AVIVA PLUS) w/Device KIT Use kit to check sugar daily  . Cholecalciferol (VITAMIN D-3 PO) Take 5,000 Units/day by mouth daily.   . clonazePAM (KLONOPIN) 1 MG tablet Take 1 tablet (1 mg total) by mouth daily as needed for anxiety.  . Dulaglutide (TRULICITY) 7.10 GY/6.9SW SOPN Inject once a week  . empagliflozin (JARDIANCE) 25 MG TABS tablet Take 25 mg by mouth daily.  . metFORMIN (GLUCOPHAGE) 500 MG tablet Take 2 tablets (1,000 mg total) by mouth 2 (two) times daily.  . Multiple Vitamins-Minerals (MULTIVITAMIN ADULT PO) Take by mouth.  [name] omeprazole (PRILOSEC) 40 MG capsule Take 40 mg by mouth daily.  . ONE TOUCH ULTRA TEST test strip USE TO CHECK BLOOD SUGAR LEVELS THREE TIMES DAILY. DX:E11.9  . pantoprazole (PROTONIX) 40 MG tablet TAKE 1 TABLET BY MOUTH EVERY DAY (Patient taking  differently: 2 (two) times daily. )  . sucralfate (CARAFATE) 1 g tablet Take 1 tablet (1 g total) by mouth 2 (two) times daily.  . vitamin E 400 UNIT capsule Take 400 Units by mouth daily.   No facility-administered encounter medications on file as of 09/21/2019.      Goals Addressed            This Visit's Progress     Patient Stated   . "We want to make sure his medications are OK" (pt-stated)       Current Barriers:  . Diabetes: controlled; most recent A1c 6.7% . Current antihyperglycemic regimen: Jardiance 25 mg daily, Trulicity 5.46 mg once weekly, metformin 1000 mg BID o Receiving Jardiance and Trulicity through 27/12/5007 o Collaboratively reapplying for assistance for 2021 . Current blood glucose readings: remain at goal per wife's report . Cardiovascular risk reduction: o Current hypertensive regimen: n/a; BP well controlled at <130/80 at office visits; UACR <30 on last check  o Current hyperlipidemia regimen: atorvastatin 40 mg daily; LDL not quite at goal <70  Pharmacist Clinical Goal(s):  [name] Over the next 90 days, patient with work with PharmD and primary care provider to address optimized self-management of diabetes  Interventions: . Received provider portion of Jardiance application for [place] for 2021 from Dr. Sherren Mocha office. Submitted application to [employer]. Will pass materials along to [employer], CPhT for follow up with the company.  [name] Contacted patient, informed we would be mailing him the patient portion of the application for [employer]  for Trulicity 5.18 mg for 9842.   Patient Self Care Activities:  . Patient will check blood glucose daily , document, and provide at future appointments . Patient will take medications as prescribed . Patient will contact provider with any episodes of hypoglycemia . Patient will report any questions or concerns to provider   Please see past updates related to this goal by clicking on the "Past  Updates" button in the selected goal           Plan: - Will collaborate w/ Susy Frizzle, CPhT as above - Will outreach patient for continued medication management support in the next 6-8 weeks  Catie Darnelle Maffucci, PharmD, [place] [phone]

## 2019-09-21 NOTE — Patient Instructions (Signed)
Visit Information  Goals Addressed            This Visit's Progress     Patient Stated   . "We want to make sure his medications are OK" (pt-stated)       Current Barriers:  . Diabetes: controlled; most recent A1c 6.7% . Current antihyperglycemic regimen: Jardiance 25 mg daily, Trulicity 7.03 mg once weekly, metformin 1000 mg BID o Receiving Jardiance and Trulicity through 50/06/3817 o Collaboratively reapplying for assistance for 2021 . Current blood glucose readings: remain at goal per wife's report . Cardiovascular risk reduction: o Current hypertensive regimen: n/a; BP well controlled at <130/80 at office visits; UACR <30 on last check  o Current hyperlipidemia regimen: atorvastatin 40 mg daily; LDL not quite at goal <70  Pharmacist Clinical Goal(s):  [name] Over the next 90 days, patient with work with PharmD and primary care provider to address optimized self-management of diabetes  Interventions: . Received provider portion of Jardiance application for [place] for 2021 from Dr. Sherren Mocha office. Submitted application to [employer]. Will pass materials along to [employer], CPhT for follow up with the company.  . Contacted patient, informed we would be mailing him the patient portion of the application for [name] for Trulicity 2.99 mg for 3716.   Patient Self Care Activities:  . Patient will check blood glucose daily , document, and provide at future appointments . Patient will take medications as prescribed . Patient will contact provider with any episodes of hypoglycemia . Patient will report any questions or concerns to provider   Please see past updates related to this goal by clicking on the "Past Updates" button in the selected goal          The patient verbalized understanding of instructions provided today and declined a print copy of patient instruction materials.   Plan: - Will collaborate w/ Susy Frizzle, CPhT as above - Will outreach  patient for continued medication management support in the next 6-8 weeks  Catie Darnelle Maffucci, PharmD, [place] [phone]

## 2019-09-22 ENCOUNTER — Other Ambulatory Visit: Payer: Self-pay | Admitting: Pharmacy Technician

## 2019-09-22 NOTE — Patient Outreach (Signed)
[facility] [facility]) Care Management  09/22/2019  [name] 06-12-1956 [id number]                                       Medication Assistance Referral  Referral From: [facility] Embedded RPh [name]   Medication/Company: [name] / [name] Patient application portion:  Mailed Provider application portion: Faxed  to Dr Mee Hives Provider address/fax verified via: Office website  This is for 2021 patient assistance   Follow up:  Will follow up with patient in 15-30  days to confirm application(s) have been received.  [name] P. [name], [place] Management [phone]

## 2019-10-05 ENCOUNTER — Other Ambulatory Visit: Payer: Self-pay | Admitting: Pharmacy Technician

## 2019-10-05 NOTE — Patient Outreach (Signed)
[place] [facility]) Care Management  10/05/2019  [name] 04/07/1956 [id number]   Care coordination call placed to BI in regards to patient's re enrollment application for 1505 for Jardiance.  Spoke to Mya who informed patient has been APPROVED 10/21/2019-10/19/2020. She informed patient can call in for his next refill in February 2021.  Will route note to embedded pharmacist Catie Darnelle Maffucci as FYI note. Still working on [employer] patient assistance for 2021.  Buffi Ewton P. Lakeithia Rasor, [place] Management [phone]

## 2019-10-19 ENCOUNTER — Other Ambulatory Visit: Payer: Self-pay | Admitting: Pharmacy Technician

## 2019-10-19 NOTE — Patient Outreach (Signed)
[name]) Care Management  10/19/2019  GRESHAM CAETANO [birth year] [id number]     Successful call placed to patient regarding patient assistance application(s) for Trulicity with [name] , HIPAA identifiers verified.   Spoke to patient's wife Dorian Pod, HIPAA confirmed and verified. Dorian Pod informed they dropped the application off at the office this week.  Confirmed with embedded pharmacist Catie Darnelle Maffucci that she does have the applications.  Follow up:  Will submit applicaiton to [facility] when information is received from embedded pharmacist.  Luiz Ochoa. Shauntavia Brackin, [place] Management [phone]

## 2019-10-25 ENCOUNTER — Other Ambulatory Visit: Payer: Self-pay | Admitting: Pharmacy Technician

## 2019-10-25 ENCOUNTER — Ambulatory Visit: Payer: Self-pay | Admitting: Pharmacist

## 2019-10-25 DIAGNOSIS — E1169 Type 2 diabetes mellitus with other specified complication: Secondary | ICD-10-CM

## 2019-10-25 NOTE — Patient Instructions (Signed)
Visit Information  Goals Addressed            This Visit's Progress     Patient Stated   . "We want to make sure his medications are OK" (pt-stated)       Current Barriers:  . Diabetes: controlled; most recent A1c 6.7% . Current antihyperglycemic regimen: Jardiance 25 mg daily, Trulicity 0.75 mg once weekly, metformin 1000 mg BID o APPROVED for Jardiance assistance from [employer] through 10/19/2020.  o Working on [employer] from [employer] for 2021 . Cardiovascular risk reduction: o Current hypertensive regimen: n/a; BP well controlled at <130/80 at office visits; UACR <30 on last check  o Current hyperlipidemia regimen: atorvastatin 40 mg daily; LDL not quite at goal <70  Pharmacist Clinical Goal(s):  [name] Over the next 90 days, patient with work with PharmD and primary care provider to address optimized self-management of diabetes  Interventions: . Patient dropped of his portion of [name] application for Trulicity for 2021. Provided to [employer], CPhT for submission and processing. Will collaborate w/ CPhT for submission and follow up.   Patient Self Care Activities:  . Patient will check blood glucose daily , document, and provide at future appointments . Patient will take medications as prescribed . Patient will contact provider with any episodes of hypoglycemia . Patient will report any questions or concerns to provider   Please see past updates related to this goal by clicking on the "Past Updates" button in the selected goal          The patient verbalized understanding of instructions provided today and declined a print copy of patient instruction materials.      Plan: - Will collaborate w/ CPhT as above - Scheduled follow up telephone call 11/23/19 @ 11:30 am   [name], PharmD, [facility] Clinical Pharmacist [facility] [phone]

## 2019-10-25 NOTE — Patient Outreach (Signed)
[facility]) Care Management  10/25/2019  [name] November 02, 1955 [id number]    Received both patient and provider portion(s) of patient assistance application(s) for Trulicity. Faxed completed application and required documents into [name].  Will follow up with company(ies) in 7-10 business days to check status of application(s).  [name], CPhT [place] Care Management [phone]

## 2019-10-25 NOTE — Chronic Care Management (AMB) (Signed)
Chronic Care Management   Follow Up Note   10/25/2019 Name: GILL DELROSSI MRN: [id number] DOB: 1956-08-21  Referred by: Guadalupe Maple, MD Reason for referral : Chronic Care Management (Medication Management)   SHELVY PERAZZO is a 64 y.o. year old male who is a primary care patient of Crissman, Jeannette How, MD. The CCM team was consulted for assistance with chronic disease management and care coordination needs.    Care coordination completed today.   Review of patient status, including review of consultants reports, relevant laboratory and other test results, and collaboration with appropriate care team members and the patient's provider was performed as part of comprehensive patient evaluation and provision of chronic care management services.    SDOH (Social Determinants of Health) screening performed today: Financial Strain . See Care Plan for related entries.   Outpatient Encounter Medications as of 10/25/2019  Medication Sig  . aspirin EC 81 MG tablet Take 81 mg by mouth daily.  [name] atorvastatin (LIPITOR) 40 MG tablet Take 1 tablet (40 mg total) by mouth daily.  . Blood Glucose Monitoring Suppl (ACCU-CHEK AVIVA PLUS) w/Device KIT Use kit to check sugar daily  . Cholecalciferol (VITAMIN D-3 PO) Take 5,000 Units/day by mouth daily.   . clonazePAM (KLONOPIN) 1 MG tablet Take 1 tablet (1 mg total) by mouth daily as needed for anxiety.  . Dulaglutide (TRULICITY) 5.70 VX/7.9TJ SOPN Inject once a week  . empagliflozin (JARDIANCE) 25 MG TABS tablet Take 25 mg by mouth daily.  . metFORMIN (GLUCOPHAGE) 500 MG tablet Take 2 tablets (1,000 mg total) by mouth 2 (two) times daily.  . Multiple Vitamins-Minerals (MULTIVITAMIN ADULT PO) Take by mouth.  [name] omeprazole (PRILOSEC) 40 MG capsule Take 40 mg by mouth daily.  . ONE TOUCH ULTRA TEST test strip USE TO CHECK BLOOD SUGAR LEVELS THREE TIMES DAILY. DX:E11.9  . pantoprazole (PROTONIX) 40 MG tablet TAKE 1 TABLET BY MOUTH EVERY DAY (Patient taking  differently: 2 (two) times daily. )  . sucralfate (CARAFATE) 1 g tablet Take 1 tablet (1 g total) by mouth 2 (two) times daily.  . vitamin E 400 UNIT capsule Take 400 Units by mouth daily.   No facility-administered encounter medications on file as of 10/25/2019.     Goals Addressed            This Visit's Progress     Patient Stated   . "We want to make sure his medications are OK" (pt-stated)       Current Barriers:  . Diabetes: controlled; most recent A1c 6.7% . Current antihyperglycemic regimen: Jardiance 25 mg daily, Trulicity 0.30 mg once weekly, metformin 1000 mg BID o APPROVED for Jardiance assistance from [place] through 10/19/2020.  o Working on [employer] from [employer] for 2021 . Cardiovascular risk reduction: o Current hypertensive regimen: n/a; BP well controlled at <130/80 at office visits; UACR <30 on last check  o Current hyperlipidemia regimen: atorvastatin 40 mg daily; LDL not quite at goal <70  Pharmacist Clinical Goal(s):  [name] Over the next 90 days, patient with work with PharmD and primary care provider to address optimized self-management of diabetes  Interventions: . Patient dropped of his portion of [name] application for Trulicity for 0923. Provided to [employer], CPhT for submission and processing. Will collaborate w/ CPhT for submission and follow up.   Patient Self Care Activities:  . Patient will check blood glucose daily , document, and provide at future appointments . Patient will take medications as prescribed .  Patient will contact provider with any episodes of hypoglycemia . Patient will report any questions or concerns to provider   Please see past updates related to this goal by clicking on the "Past Updates" button in the selected goal           Plan: - Will collaborate w/ CPhT as above - Scheduled follow up telephone call 11/23/19 @ 11:30 am   Catie Darnelle Maffucci, PharmD, [place] [phone]

## 2019-11-07 ENCOUNTER — Other Ambulatory Visit: Payer: Self-pay | Admitting: Pharmacy Technician

## 2019-11-07 NOTE — Patient Outreach (Signed)
[facility]) Care Management  11/07/2019  [name] Mar 16, 1956 [id number]    ADDENDUM  Unsuccessful outreach call placed to patient in regards to [name] application for Trulicity.  Unfortunately patient did not answer the phone.  Was calling patient to inform his renewal application was approved and to inform him to call [name] when he needs a refill.  Will route note to embedded [facility] RPh [name] to inform patient about his approval and to inform him as a reminder on how to obtain his refills as well as confirm he has out contact information. Will remove myself from care team as patient assistance is complete.  Breeanna Galgano P. Shawnee Higham, CPhT Musician Care Management [phone]

## 2019-11-07 NOTE — Patient Outreach (Signed)
[facility]) Care Management  11/07/2019  [name] 04/03/1956 [id number]    Care coordination call placed to [name] in regards to patient's Trulicity application.  Spoke to [name] who informed patient was APPROVED 10/30/2019-10/19/2020. [name] informed patient's last refill of 4 boxes or 112 days supply was sent on 09/07/2019. [name] informed patient is not quite due for a refill at this time. He informed that patient should call [name] when he is due to re order.  Will route note to embedded [facility] RPh [name] to inform patient that he is approved and how to re order his medication. Will remove myself from care team as patient assistance is complete.  [name], CPhT Musician Care Management [phone]

## 2019-11-07 NOTE — Patient Outreach (Signed)
[facility]) Care Management  11/07/2019  SALAAM BATTERSHELL [birth year] [id number]   ADDENDUM  Incoming call received from patient in regards to voicemail message left for him.  Spoke to patient, HIPAA identifiers verified.   Informed patient he was approved for 2021 for the [name] patient assistance program. Provided patient with the phone number to call to order his Trulicity. Patient verbalized understanding.  Also re informed patient that he was approved for 2021 for the [employer] patient assistance program. Provided patient with the phone number to call to order his Jardiance. Patient verbalized understanding.  Confirmed patient had name and number for future reference.  Will update embedded [facility] RPh [name] by routing her this note. Patient assistance is completed, removing myself from care team.  Stacie Acres. Latronda Spink, CPhT Musician Care Management [phone]

## 2019-11-23 ENCOUNTER — Ambulatory Visit (INDEPENDENT_AMBULATORY_CARE_PROVIDER_SITE_OTHER): Payer: PPO | Admitting: Pharmacist

## 2019-11-23 DIAGNOSIS — E1169 Type 2 diabetes mellitus with other specified complication: Secondary | ICD-10-CM

## 2019-11-23 DIAGNOSIS — F5102 Adjustment insomnia: Secondary | ICD-10-CM

## 2019-11-23 NOTE — Chronic Care Management (AMB) (Signed)
Chronic Care Management   Follow Up Note   11/23/2019 Name: [name] MRN: [id number] DOB: Dec 19, 1955  Referred by: James Maple, MD Reason for referral : Chronic Care Management (Medication Management)   [name] is a 64 y.o. year old male who is a primary care patient of Crissman, James How, MD. The CCM team was consulted for assistance with chronic disease management and care coordination needs.    Contacted patient for medication management. Spoke with his wife, [name].   Review of patient status, including review of consultants reports, relevant laboratory and other test results, and collaboration with appropriate care team members and the patient's provider was performed as part of comprehensive patient evaluation and provision of chronic care management services.    SDOH (Social Determinants of Health) screening performed today: Food Insecurity  Stress. See Care Plan for related entries.   Outpatient Encounter Medications as of 11/23/2019  Medication Sig  . aspirin EC 81 MG tablet Take 81 mg by mouth daily.  [name] atorvastatin (LIPITOR) 40 MG tablet Take 1 tablet (40 mg total) by mouth daily.  . Blood Glucose Monitoring Suppl (ACCU-CHEK AVIVA PLUS) w/Device KIT Use kit to check sugar daily  . Cholecalciferol (VITAMIN D-3 PO) Take 5,000 Units/day by mouth daily.   . clonazePAM (KLONOPIN) 1 MG tablet Take 1 tablet (1 mg total) by mouth daily as needed for anxiety.  . Dulaglutide (TRULICITY) 8.40 RF/5.4HK SOPN Inject once a week  . empagliflozin (JARDIANCE) 25 MG TABS tablet Take 25 mg by mouth daily.  . Melatonin 10 MG TABS Take 1 tablet by mouth.  . metFORMIN (GLUCOPHAGE) 500 MG tablet Take 2 tablets (1,000 mg total) by mouth 2 (two) times daily.  . Multiple Vitamins-Minerals (MULTIVITAMIN ADULT PO) Take by mouth.  . ONE TOUCH ULTRA TEST test strip USE TO CHECK BLOOD SUGAR LEVELS THREE TIMES DAILY. DX:E11.9  . sucralfate (CARAFATE) 1 g tablet Take 1 tablet (1 g total) by  mouth 2 (two) times daily.  . vitamin E 400 UNIT capsule Take 400 Units by mouth daily.  [name] omeprazole (PRILOSEC) 40 MG capsule Take 40 mg by mouth daily.  . [DISCONTINUED] pantoprazole (PROTONIX) 40 MG tablet TAKE 1 TABLET BY MOUTH EVERY DAY (Patient not taking: No sig reported)   No facility-administered encounter medications on file as of 11/23/2019.     Objective:   Goals Addressed            This Visit's Progress     Patient Stated   . "We want to make sure his medications are OK" (pt-stated)       Current Barriers:  . Diabetes: controlled; most recent A1c 6.7% o Wife reports being concerned about patient's weight loss since retirement in May. Denies any decreased appetite or lack of energy. Notes his last weight was 161. (Per clinic records was 162 here in December, 172 in March of 2020) . Current antihyperglycemic regimen: Jardiance 25 mg daily, Trulicity 0.67 mg once weekly, metformin 1000 mg BID o APPROVED for Jardiance assistance from [place] through 10/19/2020.  o APPROVED for Trulicity assistance from [place] through 10/19/2020 . Current meal patterns: o Breakfast, midday snack, lunch, snack  . Cardiovascular risk reduction: o Current hypertensive regimen: n/a; BP well controlled at <130/80 at office visits; UACR <30 on last check  o Current hyperlipidemia regimen: atorvastatin 40 mg daily; LDL not quite at goal <70 . Insomnia: reports patient is taking melatonin 10 mg QPM, but also requiring clonazepam 0.5-1 mg a  few nights a week. Reports his last 90 day supply was filled in October, but patient still has >50 tablets remaining.  . Gastritis/esophagitis: reports increased concerns w/ this recently, including episodes of vomiting. Notes that he ran out of omeprazole, and as Dr. Tiffany Kocher (GI) left, they didn't know who to ask for refills. Has appt w/ new GI provider on 11/28/19  Pharmacist Clinical Goal(s):  [name] Over the next 90 days, patient with work with PharmD and primary care  provider to address optimized self-management of diabetes  Interventions: . Comprehensive medication review performed, medication list updated in electronic medical record . Discussed good sleep hygiene. Encouraged to avoid watching TV in bed to wind down, avoid caffeine in the afternoon or naps during the day. Encouraged wife to make an appointment to see PCP sooner than currently scheduled follow up if she feels this is needed . Encouraged f/u with GI next week for noted acid reflux concerns above  Patient Self Care Activities:  . Patient will check blood glucose daily , document, and provide at future appointments . Patient will take medications as prescribed . Patient will contact provider with any episodes of hypoglycemia . Patient will report any questions or concerns to provider   Please see past updates related to this goal by clicking on the "Past Updates" button in the selected goal           Plan:  - Scheduled f/u call 01/03/20  Catie Darnelle Maffucci, PharmD, [place] [phone]

## 2019-11-23 NOTE — Patient Instructions (Signed)
Visit Information  Goals Addressed            This Visit's Progress     Patient Stated   . "We want to make sure his medications are OK" (pt-stated)       Current Barriers:  . Diabetes: controlled; most recent A1c 6.7% o Wife reports being concerned about patient's weight loss since retirement in May. Denies any decreased appetite or lack of energy. Notes his last weight was 161. (Per clinic records was 162 here in December, 172 in March of 2020) . Current antihyperglycemic regimen: Jardiance 25 mg daily, Trulicity 0.75 mg once weekly, metformin 1000 mg BID o APPROVED for Jardiance assistance from [employer] through 10/19/2020.  o APPROVED for Trulicity assistance from [place] through 10/19/2020 . Current meal patterns: o Breakfast, midday snack, lunch, snack  . Cardiovascular risk reduction: o Current hypertensive regimen: n/a; BP well controlled at <130/80 at office visits; UACR <30 on last check  o Current hyperlipidemia regimen: atorvastatin 40 mg daily; LDL not quite at goal <70 . Insomnia: reports patient is taking melatonin 10 mg QPM, but also requiring clonazepam 0.5-1 mg a few nights a week. Reports his last 90 day supply was filled in October, but patient still has >50 tablets remaining.  . Gastritis/esophagitis: reports increased concerns w/ this recently, including episodes of vomiting. Notes that he ran out of omeprazole, and as Dr. Markham Jordan (GI) left, they didn't know who to ask for refills. Has appt w/ new GI provider on 11/28/19  Pharmacist Clinical Goal(s):  [name] Over the next 90 days, patient with work with PharmD and primary care provider to address optimized self-management of diabetes  Interventions: . Comprehensive medication review performed, medication list updated in electronic medical record . Discussed good sleep hygiene. Encouraged to avoid watching TV in bed to wind down, avoid caffeine in the afternoon or naps during the day. Encouraged wife to make an appointment to see  PCP sooner than currently scheduled follow up if she feels this is needed. Encouraged to try taking melatonin 3-5 mg QPM instead, as some patients report more benefit with lower doses.  . Encouraged f/u with GI next week for noted acid reflux concerns above  Patient Self Care Activities:  . Patient will check blood glucose daily , document, and provide at future appointments . Patient will take medications as prescribed . Patient will contact provider with any episodes of hypoglycemia . Patient will report any questions or concerns to provider   Please see past updates related to this goal by clicking on the "Past Updates" button in the selected goal          The patient verbalized understanding of instructions provided today and declined a print copy of patient instruction materials.   Plan:  - Scheduled f/u call 01/03/20  [name], PharmD, [facility] Clinical Pharmacist [facility] [phone]

## 2019-11-28 ENCOUNTER — Telehealth: Payer: Self-pay | Admitting: Pharmacist

## 2019-11-28 NOTE — Telephone Encounter (Signed)
Noreene Larsson - if you get a chance today, could you call Alvino Chapel? If not, I can call tomorrow.     Copied from CRM [id number]. Topic: General - Other >> Nov 25, 2019 10:45 AM Marylen Ponto wrote: Reason for CRM: Pt wife Alvino Chapel stated she needs either Geoffery Spruce or Noreene Larsson to call back Cb# [phone]

## 2019-11-30 DIAGNOSIS — K2950 Unspecified chronic gastritis without bleeding: Secondary | ICD-10-CM | POA: Diagnosis not present

## 2019-11-30 DIAGNOSIS — R634 Abnormal weight loss: Secondary | ICD-10-CM | POA: Diagnosis not present

## 2019-11-30 DIAGNOSIS — K2101 Gastro-esophageal reflux disease with esophagitis, with bleeding: Secondary | ICD-10-CM | POA: Diagnosis not present

## 2019-12-16 ENCOUNTER — Telehealth: Payer: Self-pay | Admitting: Family Medicine

## 2019-12-16 MED ORDER — OMEPRAZOLE 40 MG PO CPDR: 40.0000 mg | DELAYED_RELEASE_CAPSULE | Freq: Every day | ORAL | 1 refills | Status: DC

## 2019-12-16 NOTE — Telephone Encounter (Signed)
Rx refilled.

## 2019-12-16 NOTE — Telephone Encounter (Signed)
Medication Refill - Medication: omeprazole (PRILOSEC) 40 MG capsule  90 day supply advised by insurance   Has the patient contacted their pharmacy? No. (Agent: If no, request that the patient contact the pharmacy for the refill.) (Agent: If yes, when and what did the pharmacy advise?)  Preferred Pharmacy (with phone number or street name): [id number] - [place], [place] - [address] Phone:  [phone]  Fax:  [phone]        Agent: Please be advised that RX refills may take up to 3 business days. We ask that you follow-up with your pharmacy.

## 2019-12-16 NOTE — Telephone Encounter (Signed)
LOV: 07/20/19

## 2019-12-21 ENCOUNTER — Ambulatory Visit (INDEPENDENT_AMBULATORY_CARE_PROVIDER_SITE_OTHER): Payer: PPO

## 2019-12-21 VITALS — BP 130/70 | HR 67 | Ht 71.0 in | Wt 165.0 lb

## 2019-12-21 DIAGNOSIS — Z0000 Encounter for general adult medical examination without abnormal findings: Secondary | ICD-10-CM | POA: Diagnosis not present

## 2019-12-21 NOTE — Progress Notes (Signed)
Subjective:   [name] is a 64 y.o. male who presents for an Initial Medicare Annual Wellness Visit.  This visit is being conducted via phone call  - after an attmept to do on video chat - due to the COVID-19 pandemic. This patient has given me verbal consent via phone to conduct this visit, patient states they are participating from their home address. Some vital signs may be absent or patient reported.   Patient identification: identified by name, DOB, and current address.    Review of Systems  Cardiac Risk Factors include: advanced age (>30mn, >>52women);dyslipidemia;hypertension;male gender;diabetes mellitus    Objective:    Today's Vitals   12/21/19 0859  BP: 130/70  Pulse: 67  Weight: 165 lb (74.8 kg)  Height: '5\' 11"'  (1.803 m)   Body mass index is 23.01 kg/m.  Advanced Directives 12/21/2019 09/18/2015  Does Patient Have a Medical Advance Directive? Yes Yes  Type of Advance Directive Living will;Healthcare [place] of [place]  [place] of [place]? No - copy requested -    Current Medications (verified) Outpatient Encounter Medications as of 12/21/2019  Medication Sig  . aspirin EC 81 MG tablet Take 81 mg by mouth daily.  .Marland Kitchenatorvastatin (LIPITOR) 40 MG tablet Take 1 tablet (40 mg total) by mouth daily.  . Blood Glucose Monitoring Suppl (ACCU-CHEK AVIVA PLUS) w/Device KIT Use kit to check sugar daily  . Cholecalciferol (VITAMIN D-3 PO) Take 5,000 Units/day by mouth daily.   . clonazePAM (KLONOPIN) 1 MG tablet Take 1 tablet (1 mg total) by mouth daily as needed for anxiety.  . Dulaglutide (TRULICITY) 05.43MKG/6.7PCSOPN Inject once a week  . empagliflozin (JARDIANCE) 25 MG TABS tablet Take 25 mg by mouth daily.  . Melatonin 10 MG TABS Take 1 tablet by mouth.  . metFORMIN (GLUCOPHAGE) 500 MG tablet Take 2 tablets (1,000 mg total) by mouth 2 (two) times daily. (Patient taking differently: Take 1,000 mg by  mouth 2 (two) times daily. 2 tablets in AM and 1 tablet in PM)  . Multiple Vitamins-Minerals (MULTIVITAMIN ADULT PO) Take by mouth.  . Multiple Vitamins-Minerals (VISION PLUS PO) Take by mouth.  .Marland Kitchenomeprazole (PRILOSEC) 40 MG capsule Take 1 capsule (40 mg total) by mouth daily. (Patient taking differently: Take 40 mg by mouth daily. BID)  . ONE TOUCH ULTRA TEST test strip USE TO CHECK BLOOD SUGAR LEVELS THREE TIMES DAILY. DX:E11.9  . sucralfate (CARAFATE) 1 g tablet Take 1 tablet (1 g total) by mouth 2 (two) times daily. (Patient taking differently: Take 1 g by mouth 2 (two) times daily. 2 tablets twice a day)  . vitamin E 400 UNIT capsule Take 400 Units by mouth daily.   No facility-administered encounter medications on file as of 12/21/2019.    Allergies (verified) Patient has no known allergies.   History: Past Medical History:  Diagnosis Date  . Colon polyps   . Diabetes mellitus without complication ([place]   . Irregular heart rhythm   . Kidney stone   . Meniscus tear    left knee  . Pneumothorax   . Pneumothorax   . Rheumatic fever    Past Surgical History:  Procedure Laterality Date  . APPENDECTOMY    . KNEE ARTHROSCOPY WITH MEDIAL MENISECTOMY Left 09/24/2015   Procedure: KNEE ARTHROSCOPY WITH partial MEDIAL MENISECTOMY, chondroplasty;  Surgeon: JDereck Leep MD;  Location: [facility] ORS;  Service: Orthopedics;  Laterality: Left;  .  LITHOTRIPSY     Family History  Problem Relation Age of Onset  . Heart disease Mother   . Thyroid disease Mother   . Diabetes Mother   . Cancer Sister        lung  . Diabetes Brother   . Diabetes Maternal Grandmother    Social History   Socioeconomic History  . Marital status: Married    Spouse name: Not on file  . Number of children: Not on file  . Years of education: Not on file  . Highest education level: Not on file  Occupational History  . Occupation: retired   Tobacco Use  . Smoking status: Former Smoker    Types: Cigarettes     Quit date: 10/20/1988    Years since quitting: 31.1  . Smokeless tobacco: Former Systems developer    Types: Chew    Quit date: 1994  Substance and Sexual Activity  . Alcohol use: No    Alcohol/week: 0.0 standard drinks  . Drug use: No  . Sexual activity: Not on file  Other Topics Concern  . Not on file  Social History Narrative  . Not on file   Social Determinants of Health   Financial Resource Strain:   . Difficulty of Paying Living Expenses: Not on file  Food Insecurity:   . Worried About Charity fundraiser in the Last Year: Not on file  . Ran Out of Food in the Last Year: Not on file  Transportation Needs:   . Lack of Transportation (Medical): Not on file  . Lack of Transportation (Non-Medical): Not on file  Physical Activity:   . Days of Exercise per Week: Not on file  . Minutes of Exercise per Session: Not on file  Stress:   . Feeling of Stress : Not on file  Social Connections:   . Frequency of Communication with Friends and Family: Not on file  . Frequency of Social Gatherings with Friends and Family: Not on file  . Attends Religious Services: Not on file  . Active Member of Clubs or Organizations: Not on file  . Attends Archivist Meetings: Not on file  . Marital Status: Not on file   Tobacco Counseling Counseling given: Not Answered   Clinical Intake:  Pre-visit preparation completed: Yes  Pain : No/denies pain     Diabetes: Yes CBG done?: No Did pt. bring in CBG monitor from home?: No  How often do you need to have someone help you when you read instructions, pamphlets, or other written materials from your doctor or pharmacy?: 1 - Never  Nutrition Risk Assessment:  Has the patient had any N/V/D within the last 2 months?  No  Does the patient have any non-healing wounds?  No  Has the patient had any unintentional weight loss or weight gain?  No   Diabetes:  Is the patient diabetic?  Yes  If diabetic, was a CBG obtained today?  No  checked at  home- 118 before eating  Did the patient bring in their glucometer from home?  No  How often do you monitor your CBG's? Twice a day   Financial Strains and Diabetes Management:  Are you having any financial strains with the device, your supplies or your medication? Yes .  Does the patient want to be seen by Chronic Care Management for management of their diabetes?  Yes  Would the patient like to be referred to a Nutritionist or for Diabetic Management?  No   Already in  contact with CCM team   Diabetic Exams:  Diabetic Eye Exam: Completed 03/2019  Diabetic Foot Exam: due now   Interpreter Needed?: No  Information entered by :: Dodger Sinning,LPN  Activities of Daily Living In your present state of health, do you have any difficulty performing the following activities: 12/21/2019  Hearing? N  Comment no hearing aids  Vision? Y  Comment reading glasses, [facility]  Difficulty concentrating or making decisions? N  Walking or climbing stairs? N  Dressing or bathing? N  Doing errands, shopping? N  Preparing Food and eating ? N  Using the Toilet? N  In the past six months, have you accidently leaked urine? N  Do you have problems with loss of bowel control? N  Managing your Medications? N  Managing your Finances? N  Housekeeping or managing your Housekeeping? N  Some recent data might be hidden     Immunizations and Health Maintenance Immunization History  Administered Date(s) Administered  . Fluad Quad(high Dose 65+) 09/07/2019  . Influenza,inj,Quad PF,6+ Mos 07/29/2016, 06/29/2017  . Influenza-Unspecified 07/20/2015, 07/29/2016, 06/29/2017, 07/20/2018  . Pneumococcal-Unspecified 01/19/1999, 04/17/2005  . Td 01/22/2009  . Zoster 11/20/2016   Health Maintenance Due  Topic Date Due  . FOOT EXAM  11/20/2017  . PNA vac Low Risk Adult (1 of 2 - PCV13) 12/15/2018  . URINE MICROALBUMIN  04/27/2019    Patient Care Team: Guadalupe Maple, MD as PCP - General (Family  Medicine) De Hollingshead, [facility] as Pharmacist (Pharmacist)  Indicate any recent Medical Services you may have received from other than [facility] providers in the past year (date may be approximate).    Assessment:   This is a routine wellness examination for Kris.  Hearing/Vision screen No exam data present  Dietary issues and exercise activities discussed: Current Exercise Habits: Home exercise routine, Type of exercise: walking, Time (Minutes): 30, Frequency (Times/Week): 7, Weekly Exercise (Minutes/Week): 210, Intensity: Mild, Exercise limited by: None identified  Goals Addressed   None    Depression Screen PHQ 2/9 Scores 12/21/2019 01/06/2018 09/24/2017 06/29/2017  PHQ - 2 Score 0 0 0 0  PHQ- 9 Score - 2 - -    Fall Risk Fall Risk  12/21/2019 09/14/2019 01/06/2018 09/24/2017 06/29/2017  Falls in the past year? 0 0 No No No  Comment - Emmi Telephone Survey: data to providers prior to load - - -  Number falls in past yr: 0 - - - -  Injury with Fall? 0 - - - -    FALL RISK PREVENTION PERTAINING TO THE HOME:  Any stairs in or around the home? Yes  steps going in home, ramp  If so, are there any without handrails? No   Home free of loose throw rugs in walkways, pet beds, electrical cords, etc? Yes  Adequate lighting in your home to reduce risk of falls? Yes   ASSISTIVE DEVICES UTILIZED TO PREVENT FALLS:  Life alert? No  Use of a cane, walker or w/c? No  Grab bars in the bathroom? yes Shower chair or bench in shower? yes Elevated toilet seat or a handicapped toilet? Yes    TIMED UP AND GO:  Unable to perform   Cognitive Function:        Screening Tests Health Maintenance  Topic Date Due  . FOOT EXAM  11/20/2017  . PNA vac Low Risk Adult (1 of 2 - PCV13) 12/15/2018  . URINE MICROALBUMIN  04/27/2019  . TETANUS/TDAP  12/20/2020 (Originally 01/23/2019)  . HEMOGLOBIN  A1C  01/23/2020  . OPHTHALMOLOGY EXAM  03/22/2020  . COLONOSCOPY  03/14/2024  . INFLUENZA VACCINE   Completed  . Hepatitis C Screening  Completed    Qualifies for Shingles Vaccine? Yes  Zostavax completed n/a. Due for Shingrix. Education has been provided regarding the importance of this vaccine. Pt has been advised to call insurance company to determine out of pocket expense. Advised may also receive vaccine at local pharmacy or Health Dept. Verbalized acceptance and understanding.  Will check with pharmacy on it.    Tdap: Discussed need for TD/TDAP vaccine, patient verbalized understanding that this is not covered as a preventative with there insurance and to call the office if he develops any new skin injuries, ie: cuts, scrapes, bug bites, or open wounds.  Flu Vaccine: up to date   Pneumococcal Vaccine: Due for Pneumococcal vaccine. will get at next appt.   Covid-19: information provided/ Completed vaccines   Cancer Screenings:  Colorectal Screening: Completed 03/15/2019. Repeat every 5 years  Lung Cancer Screening: (Low Dose CT Chest recommended if Age 46-80 years, 30 pack-year currently smoking OR have quit w/in 15years.) does not qualify.    Additional Screening:  Hepatitis C Screening: does qualify; Completed 2017  Vision Screening: Recommended annual ophthalmology exams for early detection of glaucoma and other disorders of the eye. Is the patient up to date with their annual eye exam?  Yes  Who is the provider or what is the name of the office in which the pt attends annual eye exams? [facility]    Dental Screening: Recommended annual dental exams for proper oral hygiene  Community Resource Referral:  CRR required this visit?  No        Plan:  I have personally reviewed and addressed the Medicare Annual Wellness questionnaire and have noted the following in the patient's chart:  A. Medical and social history B. Use of alcohol, tobacco or illicit drugs  C. Current medications and supplements D. Functional ability and status E.  Nutritional status F.    Physical activity G. Advance directives H. List of other physicians I.  Hospitalizations, surgeries, and ER visits in previous 12 months J.  [place] such as hearing and vision if needed, cognitive and depression L. Referrals and appointments   In addition, I have reviewed and discussed with patient certain preventive protocols, quality metrics, and best practice recommendations. A written personalized care plan for preventive services as well as general preventive health recommendations were provided to patient.   Signed,    Bevelyn Ngo, LPN   5/0/0938  Nurse Health Advisor    Nurse Notes:  bs before [phone]

## 2020-01-03 ENCOUNTER — Ambulatory Visit (INDEPENDENT_AMBULATORY_CARE_PROVIDER_SITE_OTHER): Payer: PPO | Admitting: Pharmacist

## 2020-01-03 DIAGNOSIS — E1169 Type 2 diabetes mellitus with other specified complication: Secondary | ICD-10-CM

## 2020-01-03 DIAGNOSIS — F5102 Adjustment insomnia: Secondary | ICD-10-CM

## 2020-01-03 NOTE — Chronic Care Management (AMB) (Signed)
Chronic Care Management   Follow Up Note   01/03/2020 Name: [name] MRN: [id number] DOB: 1956-04-05  Referred by: Guadalupe Maple, MD Reason for referral : Chronic Care Management (Medication Management)   [name] is a 64 y.o. year old male who is a primary care patient of Crissman, Jeannette How, MD. The CCM team was consulted for assistance with chronic disease management and care coordination needs.    Contacted patient for medication management review.   Review of patient status, including review of consultants reports, relevant laboratory and other test results, and collaboration with appropriate care team members and the patient's provider was performed as part of comprehensive patient evaluation and provision of chronic care management services.    SDOH (Social Determinants of Health) assessments performed: Yes See Care Plan activities for detailed interventions related to [facility])     Outpatient Encounter Medications as of 01/03/2020  Medication Sig Note  . aspirin EC 81 MG tablet Take 81 mg by mouth daily.   [name] atorvastatin (LIPITOR) 40 MG tablet Take 1 tablet (40 mg total) by mouth daily.   . Blood Glucose Monitoring Suppl (ACCU-CHEK AVIVA PLUS) w/Device KIT Use kit to check sugar daily   . Cholecalciferol (VITAMIN D-3 PO) Take 5,000 Units/day by mouth daily.    . clonazePAM (KLONOPIN) 1 MG tablet Take 1 tablet (1 mg total) by mouth daily as needed for anxiety. 01/03/2020: Taking ~1-3 times weekly   . Dulaglutide (TRULICITY) 3.32 RJ/1.8AC SOPN Inject once a week   . empagliflozin (JARDIANCE) 25 MG TABS tablet Take 25 mg by mouth daily.   . Melatonin 10 MG TABS Take 1 tablet by mouth. 01/03/2020: PRN  . metFORMIN (GLUCOPHAGE) 500 MG tablet Take 2 tablets (1,000 mg total) by mouth 2 (two) times daily. (Patient taking differently: Take 1,000 mg by mouth 2 (two) times daily. 2 tablets in AM and 1 tablet in PM)   . Multiple Vitamins-Minerals (MULTIVITAMIN ADULT PO) Take by  mouth.   . Multiple Vitamins-Minerals (VISION PLUS PO) Take by mouth.   [name] omeprazole (PRILOSEC) 40 MG capsule Take 1 capsule (40 mg total) by mouth daily. (Patient taking differently: Take 40 mg by mouth daily. BID)   . ONE TOUCH ULTRA TEST test strip USE TO CHECK BLOOD SUGAR LEVELS THREE TIMES DAILY. DX:E11.9   . sucralfate (CARAFATE) 1 g tablet Take 1 tablet (1 g total) by mouth 2 (two) times daily. (Patient taking differently: Take 1 g by mouth 2 (two) times daily. 2 tablets twice a day)   . vitamin E 400 UNIT capsule Take 400 Units by mouth daily.    No facility-administered encounter medications on file as of 01/03/2020.     Objective:   Goals Addressed            This Visit's Progress     Patient Stated   . "We want to make sure his medications are OK" (pt-stated)       [place] (see longtitudinal plan of care for additional care plan information)  Current Barriers:  . Diabetes: controlled; most recent A1c 6.7% . Current antihyperglycemic regimen: Jardiance 25 mg daily, Trulicity 1.66 mg once weekly, metformin 1000 mg BID o APPROVED for Jardiance assistance from [place] through 10/19/2020.  o APPROVED for Trulicity assistance from [place] through 10/19/2020 . Current glucose readings:  o Fasting: 105-135 o Afternoons: 120-130s if no snack; 140-150s if snack  . Current meal patterns: o Breakfast, midday snack, lunch, snack  . Cardiovascular  risk reduction: o Current hypertensive regimen: n/a; BP well controlled at <130/80 at office visits; UACR <30 on last check  o Current hyperlipidemia regimen: atorvastatin 40 mg daily; LDL not quite at goal <70 . Insomnia: reports patient is taking melatonin 10 mg QPM, but also requiring clonazepam 0.5-1 mg a few nights a week.  . Gastritis/esophagitis: omeprazole 40 mg BID, taking 30 minutes before breakfast and supper. Notes significant improvement in symptoms now that he is taking this medication appropriately. Carafate 1 g w/ meals,  separating by other medications by 2 hours.   Pharmacist Clinical Goal(s):  [name] Over the next 90 days, patient with work with PharmD and primary care provider to address optimized self-management of diabetes  Interventions: . Comprehensive medication review performed, medication list updated in electronic medical record . Encouraged continued glucose checks and medication adherence. F/u with [name] in April.  . Encouraged continued adherence to medication timing as recommended by GI.   Patient Self Care Activities:  . Patient will check blood glucose daily , document, and provide at future appointments . Patient will take medications as prescribed . Patient will contact provider with any episodes of hypoglycemia . Patient will report any questions or concerns to provider   Please see past updates related to this goal by clicking on the "Past Updates" button in the selected goal           Plan:  - Scheduled f/u call 02/15/20  Catie Darnelle Maffucci, PharmD, [place] [phone]  .

## 2020-01-03 NOTE — Patient Instructions (Signed)
Visit Information  Goals Addressed            This Visit's Progress     Patient Stated   . "We want to make sure his medications are OK" (pt-stated)       CARE PLAN ENTRY (see longtitudinal plan of care for additional care plan information)  Current Barriers:  . Diabetes: controlled; most recent A1c 6.7% . Current antihyperglycemic regimen: Jardiance 25 mg daily, Trulicity 0.75 mg once weekly, metformin 1000 mg BID o APPROVED for Jardiance assistance from [place] through 10/19/2020.  o APPROVED for Trulicity assistance from [place] through 10/19/2020 . Current glucose readings:  o Fasting: 105-135 o Afternoons: 120-130s if no snack; 140-150s if snack  . Current meal patterns: o Breakfast, midday snack, lunch, snack  . Cardiovascular risk reduction: o Current hypertensive regimen: n/a; BP well controlled at <130/80 at office visits; UACR <30 on last check  o Current hyperlipidemia regimen: atorvastatin 40 mg daily; LDL not quite at goal <70 . Insomnia: reports patient is taking melatonin 10 mg QPM, but also requiring clonazepam 0.5-1 mg a few nights a week.  . Gastritis/esophagitis: omeprazole 40 mg BID, taking 30 minutes before breakfast and supper. Notes significant improvement in symptoms now that he is taking this medication appropriately. Carafate 1 g w/ meals, separating by other medications by 2 hours.   Pharmacist Clinical Goal(s):  [name] Over the next 90 days, patient with work with PharmD and primary care provider to address optimized self-management of diabetes  Interventions: . Comprehensive medication review performed, medication list updated in electronic medical record . Encouraged continued glucose checks and medication adherence. F/u with Dr. Gershon Crane in April.  . Encouraged continued adherence to medication timing as recommended by GI.   Patient Self Care Activities:  . Patient will check blood glucose daily , document, and provide at future appointments . Patient will  take medications as prescribed . Patient will contact provider with any episodes of hypoglycemia . Patient will report any questions or concerns to provider   Please see past updates related to this goal by clicking on the "Past Updates" button in the selected goal          Patient verbalizes understanding of instructions provided today.   Plan:  - Scheduled f/u call 02/15/20  [name], PharmD, [facility] Clinical Pharmacist [facility] [phone]

## 2020-01-19 ENCOUNTER — Encounter: Payer: Self-pay | Admitting: Family Medicine

## 2020-01-19 ENCOUNTER — Other Ambulatory Visit: Payer: Self-pay

## 2020-01-19 ENCOUNTER — Ambulatory Visit (INDEPENDENT_AMBULATORY_CARE_PROVIDER_SITE_OTHER): Payer: PPO | Admitting: Family Medicine

## 2020-01-19 VITALS — BP 105/67 | HR 66 | Temp 98.1°F | Wt 170.0 lb

## 2020-01-19 DIAGNOSIS — F5102 Adjustment insomnia: Secondary | ICD-10-CM | POA: Diagnosis not present

## 2020-01-19 DIAGNOSIS — E1169 Type 2 diabetes mellitus with other specified complication: Secondary | ICD-10-CM

## 2020-01-19 DIAGNOSIS — E7800 Pure hypercholesterolemia, unspecified: Secondary | ICD-10-CM | POA: Diagnosis not present

## 2020-01-19 MED ORDER — SUCRALFATE 1 G PO TABS: 1.0000 g | ORAL_TABLET | Freq: Two times a day (BID) | ORAL | 1 refills | Status: DC

## 2020-01-19 MED ORDER — METFORMIN HCL 500 MG PO TABS: 1000.0000 mg | ORAL_TABLET | Freq: Two times a day (BID) | ORAL | 1 refills | Status: DC

## 2020-01-19 MED ORDER — JARDIANCE 25 MG PO TABS: 25.0000 mg | ORAL_TABLET | Freq: Every day | ORAL | 1 refills | Status: DC

## 2020-01-19 MED ORDER — ATORVASTATIN CALCIUM 40 MG PO TABS: 40.0000 mg | ORAL_TABLET | Freq: Every day | ORAL | 1 refills | Status: DC

## 2020-01-19 MED ORDER — TRULICITY 0.75 MG/0.5ML ~~LOC~~ SOAJ: SUBCUTANEOUS | 1 refills | Status: DC

## 2020-01-19 MED ORDER — CLONAZEPAM 1 MG PO TABS: 1.0000 mg | ORAL_TABLET | Freq: Every day | ORAL | 1 refills | Status: DC | PRN

## 2020-01-19 NOTE — Progress Notes (Signed)
BP 105/67   Pulse 66   Temp 98.1 F (36.7 C) (Oral)   Wt 170 lb (77.1 kg)   SpO2 97%   BMI 23.71 kg/m    Subjective:    Patient ID: [name], male    DOB: 06-04-1956, 64 y.o.   MRN: [id number]  HPI: [name] is a 64 y.o. male  Chief Complaint  Patient presents with  . Diabetes  . Hypertension  . Insomnia   Presenting today for 6 month f/u chronic conditions.   HLD - taking lipitor without issue. Denies claudication, myalgias.   DM - not checking home BSs. Diet has not been good lately, thinking his sugars are up based on that. Taking medication faithfully without side effects. Denies low blood sugar spells.   GERD - on prilosec and prn carafate which he's been on for years, no breakthrough sxs. Denies abdominal pain, melena, constipation, diarrhea.   Anxiety - taking klonopin once daily most days, more than usual lately as a close family member is in poor health and he's helping with her care and affairs, which has been really stressful.   Relevant past medical, surgical, family and social history reviewed and updated as indicated. Interim medical history since our last visit reviewed. Allergies and medications reviewed and updated.  Review of Systems  Per HPI unless specifically indicated above     Objective:    BP 105/67   Pulse 66   Temp 98.1 F (36.7 C) (Oral)   Wt 170 lb (77.1 kg)   SpO2 97%   BMI 23.71 kg/m   Wt Readings from Last 3 Encounters:  01/19/20 170 lb (77.1 kg)  12/21/19 165 lb (74.8 kg)  07/20/19 162 lb (73.5 kg)    Physical Exam Vitals and nursing note reviewed.  Constitutional:      Appearance: Normal appearance.  HENT:     Head: Atraumatic.  Eyes:     Extraocular Movements: Extraocular movements intact.     Conjunctiva/sclera: Conjunctivae normal.  Cardiovascular:     Rate and Rhythm: Normal rate and regular rhythm.  Pulmonary:     Effort: Pulmonary effort is normal.     Breath sounds: Normal breath sounds.   Musculoskeletal:        General: Normal range of motion.     Cervical back: Normal range of motion and neck supple.  Skin:    General: Skin is warm and dry.  Neurological:     General: No focal deficit present.     Mental Status: He is oriented to person, place, and time.  Psychiatric:        Mood and Affect: Mood normal.        Thought Content: Thought content normal.        Judgment: Judgment normal.     Results for orders placed or performed in visit on 01/19/20  Comprehensive metabolic panel  Result Value Ref Range   Glucose 110 (H) 65 - 99 mg/dL   BUN 20 8 - 27 mg/dL   Creatinine, Ser 7.61 0.76 - 1.27 mg/dL   GFR calc non Af Amer 70 >59 mL/min/1.73   GFR calc Af Amer 80 >59 mL/min/1.73   BUN/Creatinine Ratio 18 10 - 24   Sodium 140 134 - 144 mmol/L   Potassium 4.2 3.5 - 5.2 mmol/L   Chloride 100 96 - 106 mmol/L   CO2 24 20 - 29 mmol/L   Calcium 9.3 8.6 - 10.2 mg/dL   Total Protein 7.0  6.0 - 8.5 g/dL   Albumin 4.5 3.8 - 4.8 g/dL   Globulin, Total 2.5 1.5 - 4.5 g/dL   Albumin/Globulin Ratio 1.8 1.2 - 2.2   Bilirubin Total 0.3 0.0 - 1.2 mg/dL   Alkaline Phosphatase 76 39 - 117 IU/L   AST 14 0 - 40 IU/L   ALT 17 0 - 44 IU/L  Lipid Panel w/o Chol/HDL Ratio  Result Value Ref Range   Cholesterol, Total 159 100 - 199 mg/dL   Triglycerides 205 (H) 0 - 149 mg/dL   HDL 44 >39 mg/dL   VLDL Cholesterol Cal 34 5 - 40 mg/dL   LDL Chol Calc (NIH) 81 0 - 99 mg/dL  HgB A1c  Result Value Ref Range   Hgb A1c MFr Bld 6.5 (H) 4.8 - 5.6 %   Est. average glucose Bld gHb Est-mCnc 140 mg/dL      Assessment & Plan:   Problem List Items Addressed This Visit      Endocrine   Diabetes mellitus associated with hormonal etiology ([place]) - Primary    Recheck A1C, adjust as needed. Continue current regimen      Relevant Medications   metFORMIN (GLUCOPHAGE) 500 MG tablet   empagliflozin (JARDIANCE) 25 MG TABS tablet   Dulaglutide (TRULICITY) 3.66 QH/4.7ML SOPN   atorvastatin (LIPITOR)  40 MG tablet   Other Relevant Orders   HgB A1c (Completed)     Other   Hyperlipemia    Recheck lipids, adjust as needed. Continue current regimen and good lifestyle habits      Relevant Medications   atorvastatin (LIPITOR) 40 MG tablet   Other Relevant Orders   Comprehensive metabolic panel (Completed)   Lipid Panel w/o Chol/HDL Ratio (Completed)   Insomnia due to stress    Stable on klonopin regimen, does not wish to try something else at this time. Continue with close monitoring      Relevant Medications   clonazePAM (KLONOPIN) 1 MG tablet       Follow up plan: Return in about 6 months (around 07/20/2020) for 6 month f/u.

## 2020-01-20 LAB — COMPREHENSIVE METABOLIC PANEL
ALT: 17 IU/L (ref 0–44)
AST: 14 IU/L (ref 0–40)
Albumin/Globulin Ratio: 1.8 (ref 1.2–2.2)
Albumin: 4.5 g/dL (ref 3.8–4.8)
Alkaline Phosphatase: 76 IU/L (ref 39–117)
BUN/Creatinine Ratio: 18 (ref 10–24)
BUN: 20 mg/dL (ref 8–27)
Bilirubin Total: 0.3 mg/dL (ref 0.0–1.2)
CO2: 24 mmol/L (ref 20–29)
Calcium: 9.3 mg/dL (ref 8.6–10.2)
Chloride: 100 mmol/L (ref 96–106)
Creatinine, Ser: 1.1 mg/dL (ref 0.76–1.27)
GFR calc Af Amer: 80 mL/min/{1.73_m2} (ref >59)
GFR calc non Af Amer: 70 mL/min/{1.73_m2} (ref >59)
Globulin, Total: 2.5 g/dL (ref 1.5–4.5)
Glucose: 110 mg/dL — ABNORMAL HIGH (ref 65–99)
Potassium: 4.2 mmol/L (ref 3.5–5.2)
Sodium: 140 mmol/L (ref 134–144)
Total Protein: 7 g/dL (ref 6.0–8.5)

## 2020-01-20 LAB — LIPID PANEL W/O CHOL/HDL RATIO
Cholesterol, Total: 159 mg/dL (ref 100–199)
HDL: 44 mg/dL (ref >39)
LDL Chol Calc (NIH): 81 mg/dL (ref 0–99)
Triglycerides: 205 mg/dL — ABNORMAL HIGH (ref 0–149)
VLDL Cholesterol Cal: 34 mg/dL (ref 5–40)

## 2020-01-20 LAB — HEMOGLOBIN A1C
Est. average glucose Bld gHb Est-mCnc: 140 mg/dL
Hgb A1c MFr Bld: 6.5 % — ABNORMAL HIGH (ref 4.8–5.6)

## 2020-01-22 NOTE — Assessment & Plan Note (Signed)
Recheck A1C, adjust as needed. Continue current regimen. 

## 2020-01-22 NOTE — Assessment & Plan Note (Signed)
Stable on klonopin regimen, does not wish to try something else at this time. Continue with close monitoring

## 2020-01-22 NOTE — Assessment & Plan Note (Signed)
Recheck lipids, adjust as needed. Continue current regimen and good lifestyle habits 

## 2020-02-01 DIAGNOSIS — H353131 Nonexudative age-related macular degeneration, bilateral, early dry stage: Secondary | ICD-10-CM | POA: Diagnosis not present

## 2020-02-15 ENCOUNTER — Telehealth: Payer: Self-pay

## 2020-03-28 ENCOUNTER — Telehealth: Payer: Self-pay

## 2020-03-28 ENCOUNTER — Ambulatory Visit: Payer: Self-pay | Admitting: Pharmacist

## 2020-03-28 NOTE — Chronic Care Management (AMB) (Signed)
  Chronic Care Management   Note  03/28/2020 Name: [name] MRN: [id number] DOB: 02/26/1956  [name] is a 64 y.o. year old male who is a primary care patient of Crissman, Redge Gainer, MD. The CCM team was consulted for assistance with chronic disease management and care coordination needs.    Attempted to contact patient for medication management review. Left HIPAA compliant message for patient to return my call at their convenience.   Plan: - Will collaborate with Care Guide to outreach to schedule follow up with me  [name], PharmD, [facility] Clinical Pharmacist [facility] [phone]

## 2020-03-29 ENCOUNTER — Telehealth: Payer: Self-pay | Admitting: Family Medicine

## 2020-03-29 NOTE — Chronic Care Management (AMB) (Signed)
  Care Management   Note  03/29/2020 Name: KHALI ALBANESE MRN: [id number] DOB: March 23, 1956  EMORI KAMAU is a 64 y.o. year old male who is a primary care patient of Dossie Arbour, Redge Gainer, MD and is actively engaged with the care management team. I reached out to Roselind Messier by phone today to assist with scheduling a follow up visit with the Pharmacist.  Follow up plan: Unsuccessful telephone outreach attempt made. A HIPPA compliant phone message was left for the patient providing contact information and requesting a return call. The care management team will reach out to the patient again over the next 7 days. If patient returns call to provider office, please advise to call Embedded Care Management Care Guide Gwenevere Ghazi at [phone].  Gwenevere Ghazi  Care Guide, Embedded Care Coordination [facility]  [place], [place] [address] Direct Dial: [phone] [email or web address] Website: [email or web address]

## 2020-04-02 NOTE — Chronic Care Management (AMB) (Signed)
  Chronic Care Management   Note  04/02/2020 Name: [name] MRN: [id number] DOB: [birth year]  [name] is a 64 y.o. year old male who is a primary care patient of Dossie Arbour, Redge Gainer, MD and is actively engaged with the care management team. I reached out to Roselind Messier by phone today to assist with scheduling a 30 minute follow up visit with the Pharmacist.  Follow up plan: Telephone appointment with care management team member scheduled for:06/13/2020.  Gwenevere Ghazi  Care Guide, Embedded Care Coordination [facility]  [place], [place] [address] Direct Dial: [phone] [email or web address] Website: [email or web address]

## 2020-04-18 DIAGNOSIS — E785 Hyperlipidemia, unspecified: Secondary | ICD-10-CM | POA: Diagnosis not present

## 2020-04-18 DIAGNOSIS — E1169 Type 2 diabetes mellitus with other specified complication: Secondary | ICD-10-CM | POA: Diagnosis not present

## 2020-04-18 DIAGNOSIS — E119 Type 2 diabetes mellitus without complications: Secondary | ICD-10-CM | POA: Diagnosis not present

## 2020-05-24 ENCOUNTER — Telehealth: Payer: Self-pay | Admitting: Family Medicine

## 2020-05-24 NOTE — Telephone Encounter (Signed)
Copied from CRM [id number]. Topic: General - Other >> May 24, 2020  1:46 PM Jaquita Rector A wrote: Reason for CRM: Patient called requesting to speak with Florentina Addison states that it is time for his  empagliflozin (JARDIANCE) 25 MG TABS tablet to be re ordered. Please advise Ph# [phone] or [phone]

## 2020-05-24 NOTE — Telephone Encounter (Signed)
Pt following up on the message sent earlier. Pt would like you to know it is time to order his Jardiance from the pt assistance program. I assured him I will let you know  He is going out of town and was concerned you may not get the message

## 2020-05-25 NOTE — Telephone Encounter (Signed)
HI [name], The patient will need to call [facility] for refills. The number is [phone].  I verified this with Catie. Can you please inform [name]?  Thank You

## 2020-05-28 NOTE — Telephone Encounter (Signed)
Wife states someone called last week and she told them he was out of meds. They said they would take care of it.   I provided her with the number to [facility] and suggested pt call just to make sure it was ordered. She verbalized understanding.  Noting further needed at this time.

## 2020-06-04 ENCOUNTER — Ambulatory Visit: Payer: PPO | Admitting: Pharmacist

## 2020-06-04 DIAGNOSIS — E1169 Type 2 diabetes mellitus with other specified complication: Secondary | ICD-10-CM

## 2020-06-04 DIAGNOSIS — E7800 Pure hypercholesterolemia, unspecified: Secondary | ICD-10-CM

## 2020-06-07 NOTE — Chronic Care Management (AMB) (Signed)
Chronic Care Management   Follow Up Note   06/07/2020 Name: [name] MRN: [id number] DOB: [birth year]  Referred by: Guadalupe Maple, MD Reason for referral : No chief complaint on file.   [name] is a 64 y.o. year old male who is a primary care patient of Crissman, Jeannette How, MD. The CCM team was consulted for assistance with chronic disease management and care coordination needs.    Review of patient status, including review of consultants reports, relevant laboratory and other test results, and collaboration with appropriate care team members and the patient's provider was performed as part of comprehensive patient evaluation and provision of chronic care management services.    SDOH (Social Determinants of Health) assessments performed: No See Care Plan activities for detailed interventions related to [facility])     Outpatient Encounter Medications as of 06/04/2020  Medication Sig Note  . aspirin 81 MG chewable tablet Chew 81 mg by mouth every evening.   [name] atorvastatin (LIPITOR) 40 MG tablet Take 1 tablet (40 mg total) by mouth daily.   . Blood Glucose Monitoring Suppl (ACCU-CHEK AVIVA PLUS) w/Device KIT Use kit to check sugar daily   . Cholecalciferol (VITAMIN D-3 PO) Take 5,000 Units/day by mouth daily.    . clonazePAM (KLONOPIN) 1 MG tablet Take 1 tablet (1 mg total) by mouth daily as needed for anxiety. (Patient taking differently: Take 1 mg by mouth daily as needed for anxiety. 1-2 times weekly)   . Dulaglutide (TRULICITY) 7.04 UG/8.9VQ SOPN Inject once a week   . empagliflozin (JARDIANCE) 25 MG TABS tablet Take 25 mg by mouth daily.   . Melatonin 10 MG TABS Take 1 tablet by mouth. 01/03/2020: PRN  . metFORMIN (GLUCOPHAGE) 500 MG tablet Take 2 tablets (1,000 mg total) by mouth 2 (two) times daily. (Patient taking differently: Take 1,000 mg by mouth 2 (two) times daily. Patient taking differently 1048m qam and 501mqpm)   . Multiple Vitamins-Minerals (MULTIVITAMIN ADULT PO)  Take by mouth.   . Multiple Vitamins-Minerals (VISION PLUS PO) Take by mouth.   . Marland Kitchenmeprazole (PRILOSEC) 40 MG capsule Take 1 capsule (40 mg total) by mouth daily. (Patient taking differently: Take 40 mg by mouth daily. BID)   . ONE TOUCH ULTRA TEST test strip USE TO CHECK BLOOD SUGAR LEVELS THREE TIMES DAILY. DX:E11.9   . sucralfate (CARAFATE) 1 g tablet Take 1 tablet (1 g total) by mouth 2 (two) times daily.   . vitamin E 400 UNIT capsule Take 400 Units by mouth daily.   . Marland Kitchenspirin EC 81 MG tablet Take 81 mg by mouth daily. (Patient not taking: Reported on 06/06/2020)    No facility-administered encounter medications on file as of 06/04/2020.     Objective:   Goals Addressed              This Visit's Progress   .  "We want to make sure his medications are OK" (pt-stated)        [place] for additional care plan information)  Current Barriers:  . Diabetes: controlled; most recent A1c 6.5% . Current antihyperglycemic regimen: Jardiance 25 mg daily, Trulicity 0.9.45g once weekly, metformin 1000 mg BID o APPROVED for Jardiance assistance from [facility] 10/19/2020.  o APPROVED for Trulicity assistance from [facility] 10/19/2020 o Verified they were able to order refills. . Current glucose readings:  o Fasting: 140 o Afternoons: 180s -200s . Current meal patterns: o Breakfast, midday  snack, lunch, supper, snack  o Eats supper at 4pm and checks evening dose  . Cardiovascular risk reduction: o Current hypertensive regimen: n/a; BP well controlled at <130/80 at office visits; UACR <30 on last check  o Current hyperlipidemia regimen: atorvastatin 40 mg daily; LDL not quite at goal <70 . Insomnia: reports patient is taking melatonin 10 mg QPM, but also requiring clonazepam 0.5 mg 1-2  nights a week.  . Gastritis/esophagitis: omeprazole 40 mg BID, taking 30 minutes before breakfast and supper. Notes significant improvement in symptoms now that he  is taking this medication appropriately. Carafate 1 g w/ meals, separating by other medications by 2 hours.   Pharmacist Clinical Goal(s):  [name] Over the next 90 days, patient with work with PharmD and primary care provider to address optimized self-management of diabetes  Interventions: . Comprehensive medication review performed, medication list updated in electronic medical record . Encouraged continued glucose checks and medication adherence.  . Encouraged continued adherence to medication timing as recommended by GI.  [name] Patient given information regarding [facility] and would like to switch to take advantage of packaging and delivery.  Patient Self Care Activities:  . Patient will check blood glucose daily , document, and provide at future appointments . Patient will take medications as prescribed . Patient will contact provider with any episodes of hypoglycemia . Patient will report any questions or concerns to provider   Please see past updates related to this goal by clicking on the "Past Updates" button in the selected goal           Plan:   The care management team will reach out to the patient again over the next 60 days.    Verbal consent obtained for [facility] enhanced pharmacy services (medication synchronization, adherence packaging, delivery coordination). A medication sync plan was created to allow patient to get all medications delivered once every 30 to 90 days per patient preference. Patient understands they have freedom to choose pharmacy and clinical pharmacist will coordinate care between all prescribers and [facility].  Junita Push. Kenton Kingfisher PharmD, [facility] [phone]

## 2020-06-07 NOTE — Patient Instructions (Signed)
Visit Information [name],   It was a pleasure speaking with you today. Thank you for letting me be part of your clinical team. Please call with any questions or concerns. Someone from my team should have contacted you within 7 days of our visit to coordinate transfer to [employer]. Please give me a call you haven't heard from [place].  [name], PharmD [phone]  Goals Addressed              This Visit's Progress   .  "We want to make sure his medications are OK" (pt-stated)        CARE PLAN ENTRY (see longtitudinal plan of care for additional care plan information)  Current Barriers:  . Diabetes: controlled; most recent A1c 6.5% . Current antihyperglycemic regimen: Jardiance 25 mg daily, Trulicity 0.75 mg once weekly, metformin 1000 mg BID o APPROVED for Jardiance assistance from [facility] through 10/19/2020.  o APPROVED for Trulicity assistance from [place] through 10/19/2020 o Verified they were able to order refills. . Current glucose readings:  o Fasting: 140 o Afternoons: 180s -200s . Current meal patterns: o Breakfast, midday snack, lunch, supper, snack  o Eats supper at 4pm and checks evening dose  . Cardiovascular risk reduction: o Current hypertensive regimen: n/a; BP well controlled at <130/80 at office visits; UACR <30 on last check  o Current hyperlipidemia regimen: atorvastatin 40 mg daily; LDL not quite at goal <70 . Insomnia: reports patient is taking melatonin 10 mg QPM, but also requiring clonazepam 0.5 mg 1-2  nights a week.  . Gastritis/esophagitis: omeprazole 40 mg BID, taking 30 minutes before breakfast and supper. Notes significant improvement in symptoms now that he is taking this medication appropriately. Carafate 1 g w/ meals, separating by other medications by 2 hours.   Pharmacist Clinical Goal(s):  [name] Over the next 90 days, patient with work with PharmD and primary care provider to address optimized self-management of  diabetes  Interventions: . Comprehensive medication review performed, medication list updated in electronic medical record . Encouraged continued glucose checks and medication adherence.  . Encouraged continued adherence to medication timing as recommended by GI.  [name] Patient given information regarding [facility] and would like to switch to take advantage of packaging and delivery.  Patient Self Care Activities:  . Patient will check blood glucose daily , document, and provide at future appointments . Patient will take medications as prescribed . Patient will contact provider with any episodes of hypoglycemia . Patient will report any questions or concerns to provider   Please see past updates related to this goal by clicking on the "Past Updates" button in the selected goal          The patient verbalized understanding of instructions provided today and agreed to receive a mailed copy of patient instruction and/or educational materials.  The pharmacy team will reach out to the patient again over the next 7 days.   Mercer Pod. [name] PharmD, BCPS Clinical Pharmacist [phone]  Diabetes Mellitus and Exercise Exercising regularly is important for your overall health, especially when you have diabetes (diabetes mellitus). Exercising is not only about losing weight. It has many other health benefits, such as increasing muscle strength and bone density and reducing body fat and stress. This leads to improved fitness, flexibility, and endurance, all of which result in better overall health. Exercise has additional benefits for people with diabetes, including:  Reducing appetite.  Helping to lower and control blood glucose.  Lowering blood pressure.  Helping to control amounts of fatty substances (lipids) in the blood, such as cholesterol and triglycerides.  Helping the body to respond better to insulin (improving insulin sensitivity).  Reducing how much insulin the body  needs.  Decreasing the risk for heart disease by: ? Lowering cholesterol and triglyceride levels. ? Increasing the levels of good cholesterol. ? Lowering blood glucose levels. What is my activity plan? Your health care provider or certified diabetes educator can help you make a plan for the type and frequency of exercise (activity plan) that works for you. Make sure that you:  Do at least 150 minutes of moderate-intensity or vigorous-intensity exercise each week. This could be brisk walking, biking, or water aerobics. ? Do stretching and strength exercises, such as yoga or weightlifting, at least 2 times a week. ? Spread out your activity over at least 3 days of the week.  Get some form of physical activity every day. ? Do not go more than 2 days in a row without some kind of physical activity. ? Avoid being inactive for more than 30 minutes at a time. Take frequent breaks to walk or stretch.  Choose a type of exercise or activity that you enjoy, and set realistic goals.  Start slowly, and gradually increase the intensity of your exercise over time. What do I need to know about managing my diabetes?   Check your blood glucose before and after exercising. ? If your blood glucose is 240 mg/dL (27.0 mmol/L) or higher before you exercise, check your urine for ketones. If you have ketones in your urine, do not exercise until your blood glucose returns to normal. ? If your blood glucose is 100 mg/dL (5.6 mmol/L) or lower, eat a snack containing 15-20 grams of carbohydrate. Check your blood glucose 15 minutes after the snack to make sure that your level is above 100 mg/dL (5.6 mmol/L) before you start your exercise.  Know the symptoms of low blood glucose (hypoglycemia) and how to treat it. Your risk for hypoglycemia increases during and after exercise. Common symptoms of hypoglycemia can include: ? Hunger. ? Anxiety. ? Sweating and feeling clammy. ? Confusion. ? Dizziness or feeling  light-headed. ? Increased heart rate or palpitations. ? Blurry vision. ? Tingling or numbness around the mouth, lips, or tongue. ? Tremors or shakes. ? Irritability.  Keep a rapid-acting carbohydrate snack available before, during, and after exercise to help prevent or treat hypoglycemia.  Avoid injecting insulin into areas of the body that are going to be exercised. For example, avoid injecting insulin into: ? The arms, when playing tennis. ? The legs, when jogging.  Keep records of your exercise habits. Doing this can help you and your health care provider adjust your diabetes management plan as needed. Write down: ? Food that you eat before and after you exercise. ? Blood glucose levels before and after you exercise. ? The type and amount of exercise you have done. ? When your insulin is expected to peak, if you use insulin. Avoid exercising at times when your insulin is peaking.  When you start a new exercise or activity, work with your health care provider to make sure the activity is safe for you, and to adjust your insulin, medicines, or food intake as needed.  Drink plenty of water while you exercise to prevent dehydration or heat stroke. Drink enough fluid to keep your urine clear or pale yellow. Summary  Exercising regularly is important for your overall health, especially when you have diabetes (diabetes  mellitus).  Exercising has many health benefits, such as increasing muscle strength and bone density and reducing body fat and stress.  Your health care provider or certified diabetes educator can help you make a plan for the type and frequency of exercise (activity plan) that works for you.  When you start a new exercise or activity, work with your health care provider to make sure the activity is safe for you, and to adjust your insulin, medicines, or food intake as needed. This information is not intended to replace advice given to you by your health care provider. Make  sure you discuss any questions you have with your health care provider. Document Revised: 04/30/2017 Document Reviewed: 03/17/2016 Elsevier Patient Education  2020 [employer].

## 2020-06-13 ENCOUNTER — Telehealth: Payer: PPO

## 2020-06-20 ENCOUNTER — Other Ambulatory Visit: Payer: Self-pay | Admitting: Family Medicine

## 2020-06-20 DIAGNOSIS — E7800 Pure hypercholesterolemia, unspecified: Secondary | ICD-10-CM

## 2020-06-20 DIAGNOSIS — E1169 Type 2 diabetes mellitus with other specified complication: Secondary | ICD-10-CM

## 2020-08-20 ENCOUNTER — Ambulatory Visit: Payer: Self-pay | Admitting: Pharmacist

## 2020-08-20 NOTE — Chronic Care Management (AMB) (Signed)
Chronic Care Management   Follow Up Note   08/20/2020 Name: [name] MRN: [id number] DOB: 03-01-1956  Referred by: Guadalupe Maple, MD Reason for referral : Chronic Care Management (follow up)   [name] is a 64 y.o. year old male who is a primary care patient of Crissman, Jeannette How, MD. The CCM team was consulted for assistance with chronic disease management and care coordination needs.    Review of patient status, including review of consultants reports, relevant laboratory and other test results, and collaboration with appropriate care team members and the patient's provider was performed as part of comprehensive patient evaluation and provision of chronic care management services.    SDOH (Social Determinants of Health) assessments performed: No See Care Plan activities for detailed interventions related to [facility])     Outpatient Encounter Medications as of 08/20/2020  Medication Sig Note  . Ascorbic Acid (VITAMIN C) 1000 MG tablet Take 1,000 mg by mouth daily.   [name] aspirin 81 MG chewable tablet Chew 81 mg by mouth every evening.   [name] atorvastatin (LIPITOR) 40 MG tablet TAKE 1 TABLET BY MOUTH EVERY DAY   . Blood Glucose Monitoring Suppl (ACCU-CHEK AVIVA PLUS) w/Device KIT Use kit to check sugar daily   . Cholecalciferol (VITAMIN D-3 PO) Take 5,000 Units/day by mouth daily.    . clonazePAM (KLONOPIN) 1 MG tablet Take 1 tablet (1 mg total) by mouth daily as needed for anxiety. (Patient taking differently: Take 1 mg by mouth daily as needed for anxiety. 1-2 times weekly)   . Dulaglutide (TRULICITY) 4.82 NO/0.3BC SOPN Inject once a week   . empagliflozin (JARDIANCE) 25 MG TABS tablet Take 25 mg by mouth daily.   . Melatonin 10 MG TABS Take 1 tablet by mouth. 01/03/2020: PRN  . metFORMIN (GLUCOPHAGE) 500 MG tablet TAKE 2 TABLETS BY MOUTH TWICE A DAY (Patient taking differently: Takes 1000 mg qam and 500 mg every evening.)   . Multiple Vitamins-Minerals (MULTIVITAMIN ADULT PO) Take  by mouth.   [name] omeprazole (PRILOSEC) 40 MG capsule Take 1 capsule (40 mg total) by mouth daily. (Patient taking differently: Take 40 mg by mouth daily. BID)   . ONE TOUCH ULTRA TEST test strip USE TO CHECK BLOOD SUGAR LEVELS THREE TIMES DAILY. DX:E11.9   . vitamin E 400 UNIT capsule Take 400 Units by mouth daily.   . Zinc 50 MG TABS Take 50 mg by mouth daily.   [name] aspirin EC 81 MG tablet Take 81 mg by mouth daily. (Patient not taking: Reported on 06/06/2020)   . Multiple Vitamins-Minerals (VISION PLUS PO) Take by mouth.   . sucralfate (CARAFATE) 1 g tablet Take 1 tablet (1 g total) by mouth 2 (two) times daily.    No facility-administered encounter medications on file as of 08/20/2020.     Objective:   Goals Addressed              This Visit's Progress   .  "We want to make sure his medications are OK" (pt-stated)        [place] (see longtitudinal plan of care for additional care plan information)  Current Barriers:  . Diabetes:  . controlled; most recent A1c 6.5% . Current antihyperglycemic regimen: Jardiance 25 mg daily, Trulicity 4.88 mg once weekly, metformin 1000 mg qam 500 mg qpm o APPROVED for Jardiance assistance from [place] through 10/19/2020.  o APPROVED for Trulicity assistance from [place] through 10/19/2020 o Verified they were able to order  refills. Will need to reapply. Patient follows with endocrinology. o Reports needing new glucometer. Will ask prescriber to send new RX . Current glucose readings:  o Fasting: 140 o Afternoons: 144- 165 Hyperlipidemia:  . Cardiovascular risk reduction: o Current hypertensive regimen: n/a; BP well controlled at <130/80 at office visits; UACR <30 on last check  o Current hyperlipidemia regimen: atorvastatin 40 mg daily; LDL not quite at goal <70 ASCVD risk 23.5% o  Pharmacist Clinical Goal(s):  [name] Over the next 90 days, patient with work with PharmD and primary care provider to address optimized self-management of  diabetes  Interventions: . Comprehensive medication review performed, medication list updated in electronic medical record . Encouraged continued glucose checks and medication adherence.  . Encouraged continued adherence to medication timing as recommended by GI.  [name] Patient given information regarding [facility] and would like to switch to take advantage of packaging and delivery.  Patient Self Care Activities:  . Patient will check blood glucose daily , document, and provide at future appointments . Patient will take medications as prescribed . Patient will contact provider with any episodes of hypoglycemia . Patient will report any questions or concerns to provider  . Patient will provide required portion of PAP application.  Please see past updates related to this goal by clicking on the "Past Updates" button in the selected goal          Consult visits: 04/18/20- [name], Endocrinology - No changes to regimen  Diabetes   A1c goal <7%  Recent Relevant Labs: Lab Results  Component Value Date/Time   HGBA1C 6.5 (H) 01/19/2020 11:58 AM   HGBA1C 6.7 (H) 07/25/2019 02:09 PM   HGBA1C 6.4 09/06/2018 04:03 PM   HGBA1C 6.7 04/26/2018 04:54 PM   MICROALBUR 10 04/26/2018 02:53 PM   MICROALBUR 10 11/20/2016 08:51 AM    Last diabetic Eye exam:  Lab Results  Component Value Date/Time   HMDIABEYEEXA No Retinopathy 03/23/2019 12:00 AM    Last diabetic Foot exam: No results found for: HMDIABFOOTEX   Checking BG: Four times weekly  Recent FBG Readings: 140 -150 Recent HS BG readings: 145-165  Patient has failed these meds in past: NA Patient is currently controlled on the following medications: . Jardiance 25 mg daily . Trulicity 0.03 mg weekly . Metformin 1000 mg qam 500 mg qpm  We discussed: PAP for Trulicity and Jardiance approved  through 10/19/20. Will need renewal. Patient follows with endocrinology. He states his meter is no longer working so he has been  using his wife's. Will collaborate with prescribers to have new RX for meter and strips sent. He denies any symptoms or hypoglycemia or low readings. He remains active in his yard and walking his [place]. He eats mostly a low-carb diet and avoids sugary drinks most of the time. He reports taking an additional 1/4-1/2 tablet of metformin if he indulges in dessert. He states this works well for him and his sugar does not go above 200. He reports self- titrating his metformin to 500 mg every evening due to lows overnight.  Plan Continue current regimen. Reapply for Trulicity and Jardiance PAP.  Hyperlipidemia   LDL goal < 70  Last lipids Lab Results  Component Value Date   CHOL 159 01/19/2020   HDL 44 01/19/2020   LDLCALC 81 01/19/2020   TRIG 205 (H) 01/19/2020   CHOLHDL 3.7 01/06/2018   Hepatic Function Latest Ref Rng & Units 01/19/2020 07/25/2019 09/06/2018  Total Protein 6.0 - 8.5  g/dL 7.0 6.6 -  Albumin 3.8 - 4.8 g/dL 4.5 4.3 -  AST 0 - 40 IU/L 14 15 34  ALT 0 - 44 IU/L '17 17 28  ' Alk Phosphatase 39 - 117 IU/L 76 82 -  Total Bilirubin 0.0 - 1.2 mg/dL 0.3 0.4 -     The 10-year ASCVD risk score Mikey Bussing DC Jr., et al., 2013) is: 23.5%   Values used to calculate the score:     Age: 64 years     Sex: Male     Is Non-Hispanic African American: No     Diabetic: Yes     Tobacco smoker: No     Systolic Blood Pressure: 940 mmHg     Is BP treated: No     HDL Cholesterol: 44 mg/dL     Total Cholesterol: 159 mg/dL   Patient has failed these meds in past: NA Patient is currently uncontrolled on the following medications:  . Atorvastatin 40 mg qd . Aspirin 81 mg qd  We discussed:  Diet and exercise extensively. LDL and TG above goal for persons with DM. Recheck at next visit and consider increasing dose. He last saw Merrie Roof, [place] in April and she recommended he return in 6 months for follow-up. Reviewed with patient.   Plan  Continue current medications Medication Management    Pt uses [facility] for all medications Uses pill box? No - packaging Pt endorses % compliance  Plan  Continue current medication management strategy    Follow up: 3 month phone visit  Junita Push. Kenton Kingfisher PharmD, [place] Clinical Pharmacist [facility] [phone]      Junita Push. Kenton Kingfisher PharmD, [facility] [phone]

## 2020-08-20 NOTE — Patient Instructions (Addendum)
Visit Information  It was a pleasure speaking with you today. Thank you for letting me be part of your clinical team. Please call with any questions or concerns.   Goals Addressed              This Visit's Progress   .  "We want to make sure his medications are OK" (pt-stated)        CARE PLAN ENTRY (see longtitudinal plan of care for additional care plan information)  Current Barriers:  . Diabetes:  . controlled; most recent A1c 6.5% . Current antihyperglycemic regimen: Jardiance 25 mg daily, Trulicity 0.75 mg once weekly, metformin 1000 mg qam 500 mg qpm o APPROVED for Jardiance assistance from [place] through 10/19/2020.  o APPROVED for Trulicity assistance from [place] through 10/19/2020 o Verified they were able to order refills. Will need to reapply. Patient follows with endocrinology. o Reports needing new glucometer. Will ask prescriber to send new RX . Current glucose readings:  o Fasting: 140 o Afternoons: 144- 165 Hyperlipidemia:  . Cardiovascular risk reduction: o Current hypertensive regimen: n/a; BP well controlled at <130/80 at office visits; UACR <30 on last check  o Current hyperlipidemia regimen: atorvastatin 40 mg daily; LDL not quite at goal <70 ASCVD risk 23.5% o  Pharmacist Clinical Goal(s):  [name] Over the next 90 days, patient with work with PharmD and primary care provider to address optimized self-management of diabetes  Interventions: . Comprehensive medication review performed, medication list updated in electronic medical record . Encouraged continued glucose checks and medication adherence.  . Encouraged continued adherence to medication timing as recommended by GI.  [name] Patient given information regarding [facility] and would like to switch to take advantage of packaging and delivery.  Patient Self Care Activities:  . Patient will check blood glucose daily , document, and provide at future appointments . Patient will take medications as  prescribed . Patient will contact provider with any episodes of hypoglycemia . Patient will report any questions or concerns to provider  . Patient will provide required portion of PAP application.  Please see past updates related to this goal by clicking on the "Past Updates" button in the selected goal          The patient verbalized understanding of instructions provided today and agreed to receive a mailed copy of patient instruction and/or educational materials.  Telephone follow up appointment with pharmacy team member scheduled for: 3 months  Mercer Pod. Reham Slabaugh PharmD, BCPS Clinical Pharmacist [phone]  Diabetes Mellitus and Nutrition, Adult When you have diabetes (diabetes mellitus), it is very important to have healthy eating habits because your blood sugar (glucose) levels are greatly affected by what you eat and drink. Eating healthy foods in the appropriate amounts, at about the same times every day, can help you:  Control your blood glucose.  Lower your risk of heart disease.  Improve your blood pressure.  Reach or maintain a healthy weight. Every person with diabetes is different, and each person has different needs for a meal plan. Your health care provider may recommend that you work with a diet and nutrition specialist (dietitian) to make a meal plan that is best for you. Your meal plan may vary depending on factors such as:  The calories you need.  The medicines you take.  Your weight.  Your blood glucose, blood pressure, and cholesterol levels.  Your activity level.  Other health conditions you have, such as heart or kidney disease. How do carbohydrates affect me?  Carbohydrates, also called carbs, affect your blood glucose level more than any other type of food. Eating carbs naturally raises the amount of glucose in your blood. Carb counting is a method for keeping track of how many carbs you eat. Counting carbs is important to keep your blood glucose  at a healthy level, especially if you use insulin or take certain oral diabetes medicines. It is important to know how many carbs you can safely have in each meal. This is different for every person. Your dietitian can help you calculate how many carbs you should have at each meal and for each snack. Foods that contain carbs include:  Bread, cereal, rice, pasta, and crackers.  Potatoes and corn.  Peas, beans, and lentils.  Milk and yogurt.  Fruit and juice.  Desserts, such as cakes, cookies, ice cream, and candy. How does alcohol affect me? Alcohol can cause a sudden decrease in blood glucose (hypoglycemia), especially if you use insulin or take certain oral diabetes medicines. Hypoglycemia can be a life-threatening condition. Symptoms of hypoglycemia (sleepiness, dizziness, and confusion) are similar to symptoms of having too much alcohol. If your health care provider says that alcohol is safe for you, follow these guidelines:  Limit alcohol intake to no more than 1 drink per day for nonpregnant women and 2 drinks per day for men. One drink equals 12 oz of beer, 5 oz of wine, or 1 oz of hard liquor.  Do not drink on an empty stomach.  Keep yourself hydrated with water, diet soda, or unsweetened iced tea.  Keep in mind that regular soda, juice, and other mixers may contain a lot of sugar and must be counted as carbs. What are tips for following this plan?  Reading food labels  Start by checking the serving size on the "Nutrition Facts" label of packaged foods and drinks. The amount of calories, carbs, fats, and other nutrients listed on the label is based on one serving of the item. Many items contain more than one serving per package.  Check the total grams (g) of carbs in one serving. You can calculate the number of servings of carbs in one serving by dividing the total carbs by 15. For example, if a food has 30 g of total carbs, it would be equal to 2 servings of carbs.  Check  the number of grams (g) of saturated and trans fats in one serving. Choose foods that have low or no amount of these fats.  Check the number of milligrams (mg) of salt (sodium) in one serving. Most people should limit total sodium intake to less than 2,300 mg per day.  Always check the nutrition information of foods labeled as "low-fat" or "nonfat". These foods may be higher in added sugar or refined carbs and should be avoided.  Talk to your dietitian to identify your daily goals for nutrients listed on the label. Shopping  Avoid buying canned, premade, or processed foods. These foods tend to be high in fat, sodium, and added sugar.  Shop around the outside edge of the grocery store. This includes fresh fruits and vegetables, bulk grains, fresh meats, and fresh dairy. Cooking  Use low-heat cooking methods, such as baking, instead of high-heat cooking methods like deep frying.  Cook using healthy oils, such as olive, canola, or sunflower oil.  Avoid cooking with butter, cream, or high-fat meats. Meal planning  Eat meals and snacks regularly, preferably at the same times every day. Avoid going long periods of time without  eating.  Eat foods high in fiber, such as fresh fruits, vegetables, beans, and whole grains. Talk to your dietitian about how many servings of carbs you can eat at each meal.  Eat 4-6 ounces (oz) of lean protein each day, such as lean meat, chicken, fish, eggs, or tofu. One oz of lean protein is equal to: ? 1 oz of meat, chicken, or fish. ? 1 egg. ?  cup of tofu.  Eat some foods each day that contain healthy fats, such as avocado, nuts, seeds, and fish. Lifestyle  Check your blood glucose regularly.  Exercise regularly as told by your health care provider. This may include: ? 150 minutes of moderate-intensity or vigorous-intensity exercise each week. This could be brisk walking, biking, or water aerobics. ? Stretching and doing strength exercises, such as yoga  or weightlifting, at least 2 times a week.  Take medicines as told by your health care provider.  Do not use any products that contain nicotine or tobacco, such as cigarettes and e-cigarettes. If you need help quitting, ask your health care provider.  Work with a Veterinary surgeon or diabetes educator to identify strategies to manage stress and any emotional and social challenges. Questions to ask a health care provider  Do I need to meet with a diabetes educator?  Do I need to meet with a dietitian?  What number can I call if I have questions?  When are the best times to check my blood glucose? Where to find more information:  American Diabetes Association: diabetes.org  Academy of Nutrition and Dietetics: www.eatright.[employer] of Diabetes and Digestive and Kidney Diseases (NIH): CarFlippers.tn Summary  A healthy meal plan will help you control your blood glucose and maintain a healthy lifestyle.  Working with a diet and nutrition specialist (dietitian) can help you make a meal plan that is best for you.  Keep in mind that carbohydrates (carbs) and alcohol have immediate effects on your blood glucose levels. It is important to count carbs and to use alcohol carefully. This information is not intended to replace advice given to you by your health care provider. Make sure you discuss any questions you have with your health care provider. Document Revised: 09/18/2017 Document Reviewed: 11/10/2016 Elsevier Patient Education  2020 Elsevier Inc.  High Cholesterol  High cholesterol is a condition in which the blood has high levels of a white, waxy, fat-like substance (cholesterol). The human body needs small amounts of cholesterol. The liver makes all the cholesterol that the body needs. Extra (excess) cholesterol comes from the food that we eat. Cholesterol is carried from the liver by the blood through the blood vessels. If you have high cholesterol, deposits (plaques) may  build up on the walls of your blood vessels (arteries). Plaques make the arteries narrower and stiffer. Cholesterol plaques increase your risk for heart attack and stroke. Work with your health care provider to keep your cholesterol levels in a healthy range. What increases the risk? This condition is more likely to develop in people who:  Eat foods that are high in animal fat (saturated fat) or cholesterol.  Are overweight.  Are not getting enough exercise.  Have a family history of high cholesterol. What are the signs or symptoms? There are no symptoms of this condition. How is this diagnosed? This condition may be diagnosed from the results of a blood test.  If you are older than age 75, your health care provider may check your cholesterol every 4-6 years.  You may  be checked more often if you already have high cholesterol or other risk factors for heart disease. The blood test for cholesterol measures:  "Bad" cholesterol (LDL cholesterol). This is the main type of cholesterol that causes heart disease. The desired level for LDL is less than 100.  "Good" cholesterol (HDL cholesterol). This type helps to protect against heart disease by cleaning the arteries and carrying the LDL away. The desired level for HDL is 60 or higher.  Triglycerides. These are fats that the body can store or burn for energy. The desired number for triglycerides is lower than 150.  Total cholesterol. This is a measure of the total amount of cholesterol in your blood, including LDL cholesterol, HDL cholesterol, and triglycerides. A healthy number is less than 200. How is this treated? This condition is treated with diet changes, lifestyle changes, and medicines. Diet changes  This may include eating more whole grains, fruits, vegetables, nuts, and fish.  This may also include cutting back on red meat and foods that have a lot of added sugar. Lifestyle changes  Changes may include getting at least 40  minutes of aerobic exercise 3 times a week. Aerobic exercises include walking, biking, and swimming. Aerobic exercise along with a healthy diet can help you maintain a healthy weight.  Changes may also include quitting smoking. Medicines  Medicines are usually given if diet and lifestyle changes have failed to reduce your cholesterol to healthy levels.  Your health care provider may prescribe a statin medicine. Statin medicines have been shown to reduce cholesterol, which can reduce the risk of heart disease. Follow these instructions at home: Eating and drinking If told by your health care provider:  Eat chicken (without skin), fish, veal, shellfish, ground [place] breast, and round or loin cuts of red meat.  Do not eat fried foods or fatty meats, such as hot dogs and salami.  Eat plenty of fruits, such as apples.  Eat plenty of vegetables, such as broccoli, potatoes, and carrots.  Eat beans, peas, and lentils.  Eat grains such as barley, rice, couscous, and bulgur wheat.  Eat pasta without cream sauces.  Use skim or nonfat milk, and eat low-fat or nonfat yogurt and cheeses.  Do not eat or drink whole milk, cream, ice cream, egg yolks, or hard cheeses.  Do not eat stick margarine or tub margarines that contain trans fats (also called partially hydrogenated oils).  Do not eat saturated tropical oils, such as coconut oil and palm oil.  Do not eat cakes, cookies, crackers, or other baked goods that contain trans fats.  General instructions  Exercise as directed by your health care provider. Increase your activity level with activities such as gardening, walking, and taking the stairs.  Take over-the-counter and prescription medicines only as told by your health care provider.  Do not use any products that contain nicotine or tobacco, such as cigarettes and e-cigarettes. If you need help quitting, ask your health care provider.  Keep all follow-up visits as told by your health  care provider. This is important. Contact a health care provider if:  You are struggling to maintain a healthy diet or weight.  You need help to start on an exercise program.  You need help to stop smoking. Get help right away if:  You have chest pain.  You have trouble breathing. This information is not intended to replace advice given to you by your health care provider. Make sure you discuss any questions you have with your health  care provider. Document Revised: 10/09/2017 Document Reviewed: 04/05/2016 Elsevier Patient Education  2020 [employer].

## 2020-08-21 ENCOUNTER — Telehealth: Payer: Self-pay | Admitting: Pharmacist

## 2020-08-21 NOTE — Chronic Care Management (AMB) (Signed)
Chronic Care Management Pharmacy Assistant   Name: [name]  MRN: [id number] DOB: April 09, 1956  Reason for Encounter: Monthly Medication Review  Patient Questions:  1.  Have you seen any other providers since your last visit? No  2.  Any changes in your medicines or health? No    PCP : James Maple, MD  Allergies:  No Known Allergies  Medications: Outpatient Encounter Medications as of 08/21/2020  Medication Sig Note  . Ascorbic Acid (VITAMIN C) 1000 MG tablet Take 1,000 mg by mouth daily.   [name] aspirin 81 MG chewable tablet Chew 81 mg by mouth every evening.   [name] aspirin EC 81 MG tablet Take 81 mg by mouth daily. (Patient not taking: Reported on 06/06/2020)   . atorvastatin (LIPITOR) 40 MG tablet TAKE 1 TABLET BY MOUTH EVERY DAY   . Blood Glucose Monitoring Suppl (ACCU-CHEK AVIVA PLUS) w/Device KIT Use kit to check sugar daily   . Cholecalciferol (VITAMIN D-3 PO) Take 5,000 Units/day by mouth daily.    . clonazePAM (KLONOPIN) 1 MG tablet Take 1 tablet (1 mg total) by mouth daily as needed for anxiety. (Patient taking differently: Take 1 mg by mouth daily as needed for anxiety. 1-2 times weekly)   . Dulaglutide (TRULICITY) 1.06 YI/9.4WN SOPN Inject once a week   . empagliflozin (JARDIANCE) 25 MG TABS tablet Take 25 mg by mouth daily.   . Melatonin 10 MG TABS Take 1 tablet by mouth. 01/03/2020: PRN  . metFORMIN (GLUCOPHAGE) 500 MG tablet TAKE 2 TABLETS BY MOUTH TWICE A DAY (Patient taking differently: Takes 1000 mg qam and 500 mg every evening.)   . Multiple Vitamins-Minerals (MULTIVITAMIN ADULT PO) Take by mouth.   . Multiple Vitamins-Minerals (VISION PLUS PO) Take by mouth.   [name] omeprazole (PRILOSEC) 40 MG capsule Take 1 capsule (40 mg total) by mouth daily. (Patient taking differently: Take 40 mg by mouth daily. BID)   . ONE TOUCH ULTRA TEST test strip USE TO CHECK BLOOD SUGAR LEVELS THREE TIMES DAILY. DX:E11.9   . sucralfate (CARAFATE) 1 g tablet Take 1 tablet (1 g total) by  mouth 2 (two) times daily.   . vitamin E 400 UNIT capsule Take 400 Units by mouth daily.   . Zinc 50 MG TABS Take 50 mg by mouth daily.    No facility-administered encounter medications on file as of 08/21/2020.    Current Diagnosis: Patient Active Problem List   Diagnosis Date Noted  . Stress at work 04/26/2018  . Insomnia due to stress 07/29/2016  . Diabetes mellitus associated with hormonal etiology ([place]) 08/01/2015  . Hyperlipemia 08/01/2015    Goals Addressed   None     BP Readings from Last 3 Encounters:  01/19/20 105/67  12/21/19 130/70  07/20/19 107/71    Lab Results  Component Value Date   HGBA1C 6.5 (H) 01/19/2020   Medication Management Follow Up:    Reviewed chart for medication changes ahead of medication coordination call.  No OVs, Consults, or hospital visits since last care coordination call/Pharmacist visit.   No medication changes indicated OR if recent visit, treatment plan here.  Called patient to discuss monthly medication follow up. Patient gave verbal consent to discuss his medical needs with his wife [name]. Patient's wife is also listed under designate party release.   Patient obtains medications through [facility]  [facility] Days with [facility] services.   Last adherence delivery included: Last delivery of patient's medication was 07-23-2020.  Sucralfate (  CARAFATE) 1 g tablet: 1 tablet by mouth twice daily. Qty 90 day supply Metformin 500 mg: 2 tablets by mouth twice daily qty: 33 day supply Atorvastatin 40 mg: 1 tablet by mouth daily: qty 33 day supply   [name] declined all vitamins and over the counter medications due to buying them in bulk at [employer].  Patient also receives his Jardiance and Trulicity through patient assistance.    Patient is due for next adherence delivery on: 08/24/2020  This delivery to include the following: Metformin 541m: 10072m(2 tabs) twice daily at breakfast and evening meal.  90DS Omperazole 4049mTake one capsule by mouth at breakfast and at evening meal. 90DS Atorvastatin 45m82make one tablet by mouth at bedtime. 90DS  [name]  requested for his Klonopin prescription [facility]. The wife reported patient does not take Klonopin as prescribed. The patient was prescribed to take Klonopin 1mg:20m2 times a day PRN for anxiety. The patient is taking Klonopin as 1/2 tablet once daily. [name]   [facility] with 1 refill. Spoke with the pharmacy representative on 08-21-20 requesting a transfer of the patient's prescription but the patient's refill has expired. Per the PCP office patient will need an office visit for a refill of Klonopin. Made wife aware of need who verbalized understanding and stated she would call and schedule a PCP office visit this week. Explained to the wife that his Klonopin would not be included with the delivery on 08-24-20. The wife verbalized understanding.   The wife also explained during the call that the patient's blood glucose monitor was "broken" and the patient was not able to take his blood sugars as ordered, which is three times daily. [facility] glucose monitor the patient's insurance would cover. [facility] would cover One Touch Veiro Flex and One Touch Flex testing strips.   [facility]. Spoke to JenniArnotverbalized understanding of the information given.     Confirmed delivery date of 08-24-2020, advised the patient's wife that pharmacy will contact them the morning of delivery.  AmandCloretta Ned Clinical Pharmacist Assistant  [phone]-Up:  CPA to follow up with patient next month (December 2021)

## 2020-08-31 ENCOUNTER — Other Ambulatory Visit: Payer: Self-pay

## 2020-08-31 ENCOUNTER — Encounter: Payer: Self-pay | Admitting: Family Medicine

## 2020-08-31 ENCOUNTER — Ambulatory Visit (INDEPENDENT_AMBULATORY_CARE_PROVIDER_SITE_OTHER): Payer: PPO | Admitting: Family Medicine

## 2020-08-31 VITALS — BP 98/60 | HR 60 | Temp 97.8°F | Ht 71.0 in | Wt 169.4 lb

## 2020-08-31 DIAGNOSIS — E7800 Pure hypercholesterolemia, unspecified: Secondary | ICD-10-CM | POA: Diagnosis not present

## 2020-08-31 DIAGNOSIS — E1169 Type 2 diabetes mellitus with other specified complication: Secondary | ICD-10-CM

## 2020-08-31 DIAGNOSIS — Z23 Encounter for immunization: Secondary | ICD-10-CM

## 2020-08-31 DIAGNOSIS — R0683 Snoring: Secondary | ICD-10-CM | POA: Diagnosis not present

## 2020-08-31 DIAGNOSIS — F5102 Adjustment insomnia: Secondary | ICD-10-CM

## 2020-08-31 DIAGNOSIS — Z125 Encounter for screening for malignant neoplasm of prostate: Secondary | ICD-10-CM | POA: Diagnosis not present

## 2020-08-31 LAB — URINALYSIS, ROUTINE W REFLEX MICROSCOPIC
Bilirubin, UA: NEGATIVE
Ketones, UA: NEGATIVE
Leukocytes,UA: NEGATIVE
Nitrite, UA: NEGATIVE
Protein,UA: NEGATIVE
RBC, UA: NEGATIVE
Specific Gravity, UA: 1.02 (ref 1.005–1.030)
Urobilinogen, Ur: 0.2 mg/dL (ref 0.2–1.0)
pH, UA: 7 (ref 5.0–7.5)

## 2020-08-31 LAB — MICROALBUMIN, URINE WAIVED
Creatinine, Urine Waived: 100 mg/dL (ref 10–300)
Microalb, Ur Waived: 30 mg/L — ABNORMAL HIGH (ref 0–19)
Microalb/Creat Ratio: <30 mg/g (ref <30)

## 2020-08-31 LAB — BAYER DCA HB A1C WAIVED: HB A1C (BAYER DCA - WAIVED): 6.9 % (ref <7.0)

## 2020-08-31 MED ORDER — METFORMIN HCL 500 MG PO TABS: 1000.0000 mg | ORAL_TABLET | Freq: Two times a day (BID) | ORAL | 1 refills | Status: DC

## 2020-08-31 MED ORDER — EMPAGLIFLOZIN 25 MG PO TABS
25.0000 mg | ORAL_TABLET | Freq: Every day | ORAL | 1 refills | Status: DC
Start: 2020-08-31 — End: 2021-02-20

## 2020-08-31 MED ORDER — CLONAZEPAM 1 MG PO TABS: 1.0000 mg | ORAL_TABLET | Freq: Every day | ORAL | 0 refills | Status: AC | PRN

## 2020-08-31 MED ORDER — SUCRALFATE 1 G PO TABS: 1.0000 g | ORAL_TABLET | Freq: Two times a day (BID) | ORAL | 1 refills | Status: AC

## 2020-08-31 MED ORDER — ATORVASTATIN CALCIUM 40 MG PO TABS: 40.0000 mg | ORAL_TABLET | Freq: Every day | ORAL | 1 refills | Status: AC

## 2020-08-31 MED ORDER — TRULICITY 0.75 MG/0.5ML ~~LOC~~ SOAJ: SUBCUTANEOUS | 1 refills | Status: AC

## 2020-08-31 MED ORDER — OMEPRAZOLE 40 MG PO CPDR
40.0000 mg | DELAYED_RELEASE_CAPSULE | Freq: Two times a day (BID) | ORAL | 1 refills | Status: DC
Start: 2020-08-31 — End: 2024-07-06

## 2020-08-31 NOTE — Assessment & Plan Note (Signed)
Not doing great. Unclear if there's a component of sleep apnea- will check sleep study. Rx for klonopin given to patient today. Continue to monitor. Call with any concerns.

## 2020-08-31 NOTE — Assessment & Plan Note (Signed)
Under good control on current regimen. Continue current regimen. Continue to monitor. Call with any concerns. Refills given. Labs drawn today.   

## 2020-08-31 NOTE — Assessment & Plan Note (Signed)
Under good control with A1c of 6.9. Continue current regimen. Continue to monitor. Call with any concerns. Refills given. Labs drawn. Will share with endocrinology.

## 2020-08-31 NOTE — Progress Notes (Signed)
BP 98/60   Pulse 60   Temp 97.8 F (36.6 C) (Oral)   Ht 5\' 11"  (1.803 m)   Wt 169 lb 6.4 oz (76.8 kg)   SpO2 97%   BMI 23.63 kg/m    Subjective:    Patient ID: [name], male    DOB: December 17, 1955, 65 y.o.   MRN: [id number]  HPI: [name] is a 64 y.o. male  Chief Complaint  Patient presents with  . Diabetes  . Hyperlipidemia  . Insomnia   DIABETES Hypoglycemic episodes:no Polydipsia/polyuria: no Visual disturbance: no Chest pain: no Paresthesias: no Glucose Monitoring: yes  Accucheck frequency: Not Checking  Fasting glucose: 160s or less Taking Insulin?: no Blood Pressure Monitoring: not checking Retinal Examination: Up to Date Foot Exam: done today Diabetic Education: Completed Pneumovax: Up to Date Influenza: Up to Date Aspirin: yes  HYPERLIPIDEMIA Hyperlipidemia status: excellent compliance Satisfied with current treatment?  yes Side effects:  no Medication compliance: excellent compliance Past cholesterol meds: atorvastatin Supplements: none Aspirin:  yes The 10-year ASCVD risk score 71 DC Jr., et al., 2013) is: 15%   Values used to calculate the score:     Age: 19 years     Sex: Male     Is Non-Hispanic African American: No     Diabetic: Yes     Tobacco smoker: No     Systolic Blood Pressure: 98 mmHg     Is BP treated: No     HDL Cholesterol: 44 mg/dL     Total Cholesterol: 159 mg/dL Chest pain:  no Coronary artery disease:  no  INSOMNIA- melatonin, klonopin Duration: chronic Satisfied with sleep quality: no Difficulty falling asleep: no Difficulty staying asleep: yes Waking a few hours after sleep onset: yes Early morning awakenings: yes Daytime hypersomnolence: yes Wakes feeling refreshed: occasionally Good sleep hygiene: no Apnea: no Snoring: yes Depressed/anxious mood: yes Recent stress: yes Restless legs/nocturnal leg cramps: no Chronic pain/arthritis: no History of sleep study: no Treatments attempted: klonopin,  melatonin, uinsom and benadryl   ANXIETY/STRESS Duration: chronic Status:exacerbated Anxious mood: yes  Excessive worrying: no Irritability: no  Sweating: no Nausea: no Palpitations:no Hyperventilation: no Panic attacks: no Agoraphobia: no  Obscessions/compulsions: no Depressed mood: no Depression screen [facility] 2/9 08/31/2020 12/21/2019 01/06/2018 09/24/2017 06/29/2017  Decreased Interest 0 0 0 0 0  Down, Depressed, Hopeless 0 0 0 0 0  PHQ - 2 Score 0 0 0 0 0  Altered sleeping - - 1 - -  Tired, decreased energy - - 0 - -  Change in appetite - - 0 - -  Feeling bad or failure about yourself  - - 0 - -  Trouble concentrating - - 1 - -  Moving slowly or fidgety/restless - - 0 - -  Suicidal thoughts - - - - -  PHQ-9 Score - - 2 - -   Anhedonia: no Weight changes: no Insomnia: yes hard to stay asleep  Hypersomnia: yes Fatigue/loss of energy: yes Feelings of worthlessness: no Feelings of guilt: no Impaired concentration/indecisiveness: no Suicidal ideations: no  Crying spells: no Recent Stressors/Life Changes: yes   Relationship problems: no   Family stress: yes     Financial stress: yes    Job stress: yes    Recent death/loss: no  ????SLEEP APNEA Sleep apnea status: unknown Duration: chronic Last sleep study: never Treatments attempted: klonopin  Wakes feeling refreshed:  no Daytime hypersomnolence:  yes Fatigue:  yes Insomnia:  yes Good sleep hygiene:  yes  Difficulty falling asleep:  no Difficulty staying asleep:  yes Snoring bothers bed partner:  yes Observed apnea by bed partner: yes Obesity:  no Hypertension: no  Pulmonary hypertension:  no Coronary artery disease:  no   Relevant past medical, surgical, family and social history reviewed and updated as indicated. Interim medical history since our last visit reviewed. Allergies and medications reviewed and updated.  Review of Systems  Constitutional: Negative.   Respiratory: Negative.   Cardiovascular:  Negative.   Gastrointestinal: Negative.   Musculoskeletal: Negative.   Skin: Negative.   Neurological: Negative.   Psychiatric/Behavioral: Positive for sleep disturbance. Negative for agitation, behavioral problems, confusion, decreased concentration, dysphoric mood, hallucinations, self-injury and suicidal ideas. The patient is nervous/anxious. The patient is not hyperactive.     Per HPI unless specifically indicated above     Objective:    BP 98/60   Pulse 60   Temp 97.8 F (36.6 C) (Oral)   Ht 5\' 11"  (1.803 m)   Wt 169 lb 6.4 oz (76.8 kg)   SpO2 97%   BMI 23.63 kg/m   Wt Readings from Last 3 Encounters:  08/31/20 169 lb 6.4 oz (76.8 kg)  01/19/20 170 lb (77.1 kg)  12/21/19 165 lb (74.8 kg)    Physical Exam Vitals and nursing note reviewed.  Constitutional:      General: He is not in acute distress.    Appearance: Normal appearance. He is not ill-appearing, toxic-appearing or diaphoretic.  HENT:     Head: Normocephalic and atraumatic.     Right Ear: External ear normal.     Left Ear: External ear normal.     Nose: Nose normal.     Mouth/Throat:     Mouth: Mucous membranes are moist.     Pharynx: Oropharynx is clear.  Eyes:     General: No scleral icterus.       Right eye: No discharge.        Left eye: No discharge.     Extraocular Movements: Extraocular movements intact.     Conjunctiva/sclera: Conjunctivae normal.     Pupils: Pupils are equal, round, and reactive to light.  Cardiovascular:     Rate and Rhythm: Normal rate and regular rhythm.     Pulses: Normal pulses.     Heart sounds: Normal heart sounds. No murmur heard.  No friction rub. No gallop.   Pulmonary:     Effort: Pulmonary effort is normal. No respiratory distress.     Breath sounds: Normal breath sounds. No stridor. No wheezing, rhonchi or rales.  Chest:     Chest wall: No tenderness.  Musculoskeletal:        General: Normal range of motion.     Cervical back: Normal range of motion and  neck supple.  Skin:    General: Skin is warm and dry.     Capillary Refill: Capillary refill takes less than 2 seconds.     Coloration: Skin is not jaundiced or pale.     Findings: No bruising, erythema, lesion or rash.  Neurological:     General: No focal deficit present.     Mental Status: He is alert and oriented to person, place, and time. Mental status is at baseline.  Psychiatric:        Mood and Affect: Mood normal.        Behavior: Behavior normal.        Thought Content: Thought content normal.        Judgment: Judgment normal.  Results for orders placed or performed in visit on 01/19/20  Comprehensive metabolic panel  Result Value Ref Range   Glucose 110 (H) 65 - 99 mg/dL   BUN 20 8 - 27 mg/dL   Creatinine, Ser 1.611.10 0.76 - 1.27 mg/dL   GFR calc non Af Amer 70 >59 mL/min/1.73   GFR calc Af Amer 80 >59 mL/min/1.73   BUN/Creatinine Ratio 18 10 - 24   Sodium 140 134 - 144 mmol/L   Potassium 4.2 3.5 - 5.2 mmol/L   Chloride 100 96 - 106 mmol/L   CO2 24 20 - 29 mmol/L   Calcium 9.3 8.6 - 10.2 mg/dL   Total Protein 7.0 6.0 - 8.5 g/dL   Albumin 4.5 3.8 - 4.8 g/dL   Globulin, Total 2.5 1.5 - 4.5 g/dL   Albumin/Globulin Ratio 1.8 1.2 - 2.2   Bilirubin Total 0.3 0.0 - 1.2 mg/dL   Alkaline Phosphatase 76 39 - 117 IU/L   AST 14 0 - 40 IU/L   ALT 17 0 - 44 IU/L  Lipid Panel w/o Chol/HDL Ratio  Result Value Ref Range   Cholesterol, Total 159 100 - 199 mg/dL   Triglycerides 096205 (H) 0 - 149 mg/dL   HDL 44 >04>39 mg/dL   VLDL Cholesterol Cal 34 5 - 40 mg/dL   LDL Chol Calc (NIH) 81 0 - 99 mg/dL  HgB V4UA1c  Result Value Ref Range   Hgb A1c MFr Bld 6.5 (H) 4.8 - 5.6 %   Est. average glucose Bld gHb Est-mCnc 140 mg/dL      Assessment & Plan:   Problem List Items Addressed This Visit      Endocrine   Diabetes mellitus associated with hormonal etiology (HCC) - Primary    Under good control with A1c of 6.9. Continue current regimen. Continue to monitor. Call with any  concerns. Refills given. Labs drawn. Will share with endocrinology.      Relevant Medications   Dulaglutide (TRULICITY) 0.75 MG/0.5ML SOPN   metFORMIN (GLUCOPHAGE) 500 MG tablet   empagliflozin (JARDIANCE) 25 MG TABS tablet   atorvastatin (LIPITOR) 40 MG tablet   Other Relevant Orders   Bayer DCA Hb A1c Waived   CBC with Differential/Platelet   Comprehensive metabolic panel   Microalbumin, Urine Waived   TSH   Urinalysis, Routine w reflex microscopic     Other   Hyperlipemia    Under good control on current regimen. Continue current regimen. Continue to monitor. Call with any concerns. Refills given. Labs drawn today.       Relevant Medications   atorvastatin (LIPITOR) 40 MG tablet   Other Relevant Orders   CBC with Differential/Platelet   Comprehensive metabolic panel   Lipid Panel w/o Chol/HDL Ratio   Insomnia due to stress    Not doing great. Unclear if there's a component of sleep apnea- will check sleep study. Rx for klonopin given to patient today. Continue to monitor. Call with any concerns.       Relevant Medications   clonazePAM (KLONOPIN) 1 MG tablet   Other Relevant Orders   Ambulatory referral to Sleep Studies    Other Visit Diagnoses    Snoring       [place] for OSA- will check sleep study. Await results. Treat a needed.    Relevant Orders   Ambulatory referral to Sleep Studies   Screening for prostate cancer       Labs drawn today. Await results.    Relevant Orders   PSA  Need for influenza vaccination       Flu shot given today.   Relevant Orders   Flu Vaccine QUAD High Dose(Fluad) (Completed)       Follow up plan: Return in about 3 months (around 12/01/2020).

## 2020-09-01 LAB — COMPREHENSIVE METABOLIC PANEL
ALT: 18 IU/L (ref 0–44)
AST: 15 IU/L (ref 0–40)
Albumin/Globulin Ratio: 2 (ref 1.2–2.2)
Albumin: 4.7 g/dL (ref 3.8–4.8)
Alkaline Phosphatase: 77 IU/L (ref 44–121)
BUN/Creatinine Ratio: 24 (ref 10–24)
BUN: 21 mg/dL (ref 8–27)
Bilirubin Total: 0.5 mg/dL (ref 0.0–1.2)
CO2: 26 mmol/L (ref 20–29)
Calcium: 9.7 mg/dL (ref 8.6–10.2)
Chloride: 101 mmol/L (ref 96–106)
Creatinine, Ser: 0.89 mg/dL (ref 0.76–1.27)
GFR calc Af Amer: 103 mL/min/{1.73_m2} (ref >59)
GFR calc non Af Amer: 89 mL/min/{1.73_m2} (ref >59)
Globulin, Total: 2.3 g/dL (ref 1.5–4.5)
Glucose: 107 mg/dL — ABNORMAL HIGH (ref 65–99)
Potassium: 4.2 mmol/L (ref 3.5–5.2)
Sodium: 138 mmol/L (ref 134–144)
Total Protein: 7 g/dL (ref 6.0–8.5)

## 2020-09-01 LAB — CBC WITH DIFFERENTIAL/PLATELET
Basophils Absolute: 0 10*3/uL (ref 0.0–0.2)
Basos: 1 %
EOS (ABSOLUTE): 0.3 10*3/uL (ref 0.0–0.4)
Eos: 5 %
Hematocrit: 47.8 % (ref 37.5–51.0)
Hemoglobin: 15.6 g/dL (ref 13.0–17.7)
Immature Grans (Abs): 0 10*3/uL (ref 0.0–0.1)
Immature Granulocytes: 0 %
Lymphocytes Absolute: 2.2 10*3/uL (ref 0.7–3.1)
Lymphs: 38 %
MCH: 30.5 pg (ref 26.6–33.0)
MCHC: 32.6 g/dL (ref 31.5–35.7)
MCV: 93 fL (ref 79–97)
Monocytes Absolute: 0.7 10*3/uL (ref 0.1–0.9)
Monocytes: 11 %
Neutrophils Absolute: 2.6 10*3/uL (ref 1.4–7.0)
Neutrophils: 45 %
Platelets: 191 10*3/uL (ref 150–450)
RBC: 5.12 x10E6/uL (ref 4.14–5.80)
RDW: 13.1 % (ref 11.6–15.4)
WBC: 5.8 10*3/uL (ref 3.4–10.8)

## 2020-09-01 LAB — LIPID PANEL W/O CHOL/HDL RATIO
Cholesterol, Total: 169 mg/dL (ref 100–199)
HDL: 49 mg/dL (ref >39)
LDL Chol Calc (NIH): 104 mg/dL — ABNORMAL HIGH (ref 0–99)
Triglycerides: 84 mg/dL (ref 0–149)
VLDL Cholesterol Cal: 16 mg/dL (ref 5–40)

## 2020-09-01 LAB — PSA: Prostate Specific Ag, Serum: 2.3 ng/mL (ref 0.0–4.0)

## 2020-09-01 LAB — TSH: TSH: 1.05 u[IU]/mL (ref 0.450–4.500)

## 2020-09-03 DIAGNOSIS — M25562 Pain in left knee: Secondary | ICD-10-CM | POA: Diagnosis not present

## 2020-09-03 DIAGNOSIS — M1732 Unilateral post-traumatic osteoarthritis, left knee: Secondary | ICD-10-CM | POA: Diagnosis not present

## 2020-09-19 ENCOUNTER — Other Ambulatory Visit: Payer: Self-pay | Admitting: Physician Assistant

## 2020-09-19 DIAGNOSIS — M2392 Unspecified internal derangement of left knee: Secondary | ICD-10-CM

## 2020-09-20 ENCOUNTER — Telehealth: Payer: Self-pay | Admitting: Pharmacist

## 2020-09-20 NOTE — Progress Notes (Signed)
    Chronic Care Management Pharmacy Assistant   Name: DARBY SHADWICK  MRN: [id number] DOB: 08-Mar-1956  Reason for Encounter: Coordination of enhanced pharmacy delivery.    PCP : Guadalupe Maple, MD  Allergies:  No Known Allergies  Medications: Outpatient Encounter Medications as of 09/20/2020  Medication Sig Note  . Ascorbic Acid (VITAMIN C) 1000 MG tablet Take 1,000 mg by mouth daily.   [name] aspirin 81 MG chewable tablet Chew 81 mg by mouth every evening.   [name] aspirin EC 81 MG tablet Take 81 mg by mouth daily. (Patient not taking: Reported on 06/06/2020)   . atorvastatin (LIPITOR) 40 MG tablet Take 1 tablet (40 mg total) by mouth daily.   . Blood Glucose Monitoring Suppl (ACCU-CHEK AVIVA PLUS) w/Device KIT Use kit to check sugar daily   . Cholecalciferol (VITAMIN D-3 PO) Take 5,000 Units/day by mouth daily.    . clonazePAM (KLONOPIN) 1 MG tablet Take 1 tablet (1 mg total) by mouth daily as needed for anxiety.   . Dulaglutide (TRULICITY) 4.85 IO/2.7OJ SOPN Inject once a week   . empagliflozin (JARDIANCE) 25 MG TABS tablet Take 1 tablet (25 mg total) by mouth daily.   . Melatonin 10 MG TABS Take 1 tablet by mouth. 01/03/2020: PRN  . metFORMIN (GLUCOPHAGE) 500 MG tablet Take 2 tablets (1,000 mg total) by mouth 2 (two) times daily.   . Multiple Vitamins-Minerals (MULTIVITAMIN ADULT PO) Take by mouth.   . Multiple Vitamins-Minerals (VISION PLUS PO) Take by mouth.   [name] omeprazole (PRILOSEC) 40 MG capsule Take 1 capsule (40 mg total) by mouth in the morning and at bedtime.   . ONE TOUCH ULTRA TEST test strip USE TO CHECK BLOOD SUGAR LEVELS THREE TIMES DAILY. DX:E11.9   . sucralfate (CARAFATE) 1 g tablet Take 1 tablet (1 g total) by mouth 2 (two) times daily.   . vitamin E 400 UNIT capsule Take 400 Units by mouth daily.   . Zinc 50 MG TABS Take 50 mg by mouth daily.    No facility-administered encounter medications on file as of 09/20/2020.    Current Diagnosis: Patient Active Problem List    Diagnosis Date Noted  . Stress at work 04/26/2018  . Insomnia due to stress 07/29/2016  . Diabetes mellitus associated with hormonal etiology ([place]) 08/01/2015  . Hyperlipemia 08/01/2015    Goals Addressed   None    09-17-20: Patient enrolled into enhanced pharmacy services with [facility]. Attempted to contact the patient to ensure he had all of his needed medication and was not in need of any refills. Called the patient but no answer. Left HIPAA compliant voicemail requesting a call back.   09-19-2020: Second attempt to contact patient. Again no answer. Left HIPPA compliant voicemail requesting a call back.   09-20-2020: Third attempt to contact patient. Again no answer on home phone. Left another HIPPA compliant voicemail requesting a call back. Attempted to reach patient's emergency contact (wife), but phone did not ring and went straight to a voicemail recording. Left a HIPAA compliant voicemail requesting a call back.   Cloretta Ned, LPN Clinical Pharmacist Assistant  [phone]   Follow-Up:  Continue to contact the patient the following week of 09-24-2020

## 2020-09-21 ENCOUNTER — Telehealth: Payer: Self-pay

## 2020-09-21 NOTE — Telephone Encounter (Signed)
Copied from CRM [id number]. Topic: General - Other >> Sep 21, 2020  8:56 AM Jaquita Rector A wrote: Reason for CRM: Mr Juarbe called back for Raynelle Fanning or her assistant Marchelle Folks say he has enough of his other medication but will run out of empagliflozin (JARDIANCE) 25 MG TABS tablet before January. Can be reached at Ph# [phone]

## 2020-09-21 NOTE — Telephone Encounter (Signed)
erroneous entry already sent  Copied from CRM [id number]. Topic: General - Other >> Sep 21, 2020  8:56 AM Jaquita Rector A wrote: Reason for CRM: Mr Lederman called back for Raynelle Fanning or her assistant Marchelle Folks say he has enough of his other medication but will run out of empagliflozin (JARDIANCE) 25 MG TABS tablet before January. Can be reached at Ph# [phone]

## 2020-10-03 ENCOUNTER — Other Ambulatory Visit: Payer: Self-pay

## 2020-10-03 ENCOUNTER — Ambulatory Visit
Admission: RE | Admit: 2020-10-03 | Discharge: 2020-10-03 | Disposition: A | Discharge status: Home / Self Care | Payer: PPO | Source: Ambulatory Visit | Attending: Physician Assistant | Admitting: Physician Assistant

## 2020-10-03 DIAGNOSIS — M25562 Pain in left knee: Secondary | ICD-10-CM | POA: Diagnosis not present

## 2020-10-03 DIAGNOSIS — M2392 Unspecified internal derangement of left knee: Secondary | ICD-10-CM | POA: Diagnosis not present

## 2020-10-09 ENCOUNTER — Telehealth: Payer: Self-pay | Admitting: Pharmacist

## 2020-10-09 NOTE — Progress Notes (Signed)
    Chronic Care Management Pharmacy Assistant   Name: IKENNA OHMS  MRN: [id number] DOB: 08-18-1956  Reason for Encounter: Patient Assistance Applications   PCP : Valerie Roys, DO  Allergies:  No Known Allergies  Medications: Outpatient Encounter Medications as of 10/09/2020  Medication Sig Note  . Ascorbic Acid (VITAMIN C) 1000 MG tablet Take 1,000 mg by mouth daily.   [name] aspirin 81 MG chewable tablet Chew 81 mg by mouth every evening.   [name] aspirin EC 81 MG tablet Take 81 mg by mouth daily. (Patient not taking: Reported on 06/06/2020)   . atorvastatin (LIPITOR) 40 MG tablet Take 1 tablet (40 mg total) by mouth daily.   . Blood Glucose Monitoring Suppl (ACCU-CHEK AVIVA PLUS) w/Device KIT Use kit to check sugar daily   . Cholecalciferol (VITAMIN D-3 PO) Take 5,000 Units/day by mouth daily.    . clonazePAM (KLONOPIN) 1 MG tablet Take 1 tablet (1 mg total) by mouth daily as needed for anxiety.   . Dulaglutide (TRULICITY) 7.41 SE/3.9RV SOPN Inject once a week   . empagliflozin (JARDIANCE) 25 MG TABS tablet Take 1 tablet (25 mg total) by mouth daily.   . Melatonin 10 MG TABS Take 1 tablet by mouth. 01/03/2020: PRN  . metFORMIN (GLUCOPHAGE) 500 MG tablet Take 2 tablets (1,000 mg total) by mouth 2 (two) times daily.   . Multiple Vitamins-Minerals (MULTIVITAMIN ADULT PO) Take by mouth.   . Multiple Vitamins-Minerals (VISION PLUS PO) Take by mouth.   [name] omeprazole (PRILOSEC) 40 MG capsule Take 1 capsule (40 mg total) by mouth in the morning and at bedtime.   . ONE TOUCH ULTRA TEST test strip USE TO CHECK BLOOD SUGAR LEVELS THREE TIMES DAILY. DX:E11.9   . sucralfate (CARAFATE) 1 g tablet Take 1 tablet (1 g total) by mouth 2 (two) times daily.   . vitamin E 400 UNIT capsule Take 400 Units by mouth daily.   . Zinc 50 MG TABS Take 50 mg by mouth daily.    No facility-administered encounter medications on file as of 10/09/2020.    Current Diagnosis: Patient Active Problem List   Diagnosis  Date Noted  . Stress at work 04/26/2018  . Insomnia due to stress 07/29/2016  . Diabetes mellitus associated with hormonal etiology ([place]) 08/01/2015  . Hyperlipemia 08/01/2015    10-09-20: Called the patient and explained his patient assistance applications for Jardiance and Trulicity were initiated and were needing his signature and proof of income before it went be sent to his endocrinologist office. The patient verbalized understanding of information.   Cloretta Ned, LPN Clinical Pharmacist Assistant  [phone]    Follow-Up:  Pharmacist Review

## 2020-10-10 ENCOUNTER — Telehealth: Payer: Self-pay | Admitting: Pharmacist

## 2020-10-10 NOTE — Progress Notes (Signed)
    Chronic Care Management Pharmacy Assistant   Name: [name]  MRN: [id number] DOB: December 13, 1955  Reason for Encounter: Updating Patient Assistance Application/ Jardiance     Allergies:  No Known Allergies  Medications: Outpatient Encounter Medications as of 10/10/2020  Medication Sig Note  . Ascorbic Acid (VITAMIN C) 1000 MG tablet Take 1,000 mg by mouth daily.   [name] aspirin 81 MG chewable tablet Chew 81 mg by mouth every evening.   [name] aspirin EC 81 MG tablet Take 81 mg by mouth daily. (Patient not taking: Reported on 06/06/2020)   . atorvastatin (LIPITOR) 40 MG tablet Take 1 tablet (40 mg total) by mouth daily.   . Blood Glucose Monitoring Suppl (ACCU-CHEK AVIVA PLUS) w/Device KIT Use kit to check sugar daily   . Cholecalciferol (VITAMIN D-3 PO) Take 5,000 Units/day by mouth daily.    . clonazePAM (KLONOPIN) 1 MG tablet Take 1 tablet (1 mg total) by mouth daily as needed for anxiety.   . Dulaglutide (TRULICITY) 2.24 MG/5.0IB SOPN Inject once a week   . empagliflozin (JARDIANCE) 25 MG TABS tablet Take 1 tablet (25 mg total) by mouth daily.   . Melatonin 10 MG TABS Take 1 tablet by mouth. 01/03/2020: PRN  . metFORMIN (GLUCOPHAGE) 500 MG tablet Take 2 tablets (1,000 mg total) by mouth 2 (two) times daily.   . Multiple Vitamins-Minerals (MULTIVITAMIN ADULT PO) Take by mouth.   . Multiple Vitamins-Minerals (VISION PLUS PO) Take by mouth.   [name] omeprazole (PRILOSEC) 40 MG capsule Take 1 capsule (40 mg total) by mouth in the morning and at bedtime.   . ONE TOUCH ULTRA TEST test strip USE TO CHECK BLOOD SUGAR LEVELS THREE TIMES DAILY. DX:E11.9   . sucralfate (CARAFATE) 1 g tablet Take 1 tablet (1 g total) by mouth 2 (two) times daily.   . vitamin E 400 UNIT capsule Take 400 Units by mouth daily.   . Zinc 50 MG TABS Take 50 mg by mouth daily.    No facility-administered encounter medications on file as of 10/10/2020.    Current Diagnosis: Patient Active Problem List   Diagnosis Date  Noted  . Stress at work 04/26/2018  . Insomnia due to stress 07/29/2016  . Diabetes mellitus associated with hormonal etiology ([place]) 08/01/2015  . Hyperlipemia 08/01/2015    10-10-2020: Updated the patient assistance application for Jardiance. Uploaded and ready for printing.   Cloretta Ned, LPN Clinical Pharmacist Assistant  [phone]    Follow-Up:  Patient Assistance Coordination

## 2020-10-15 ENCOUNTER — Telehealth: Payer: Self-pay | Admitting: Pharmacist

## 2020-10-15 NOTE — Progress Notes (Signed)
  Chronic Care Management Pharmacy Assistant   Name: [name]  MRN: [id number] DOB: 05/12/1956  Reason for Encounter: Medication Review/ Monthly Review   Patient Questions:  1.  Have you seen any other providers since your last visit? No  2.  Any changes in your medicines or health? No    PCP : Johnson, Megan P, DO  Allergies:  No Known Allergies  Medications: Outpatient Encounter Medications as of 10/15/2020  Medication Sig Note  . Ascorbic Acid (VITAMIN C) 1000 MG tablet Take 1,000 mg by mouth daily.   . aspirin 81 MG chewable tablet Chew 81 mg by mouth every evening.   . aspirin EC 81 MG tablet Take 81 mg by mouth daily. (Patient not taking: Reported on 06/06/2020)   . atorvastatin (LIPITOR) 40 MG tablet Take 1 tablet (40 mg total) by mouth daily.   . Blood Glucose Monitoring Suppl (ACCU-CHEK AVIVA PLUS) w/Device KIT Use kit to check sugar daily   . Cholecalciferol (VITAMIN D-3 PO) Take 5,000 Units/day by mouth daily.    . clonazePAM (KLONOPIN) 1 MG tablet Take 1 tablet (1 mg total) by mouth daily as needed for anxiety.   . Dulaglutide (TRULICITY) 0.75 MG/0.5ML SOPN Inject once a week   . empagliflozin (JARDIANCE) 25 MG TABS tablet Take 1 tablet (25 mg total) by mouth daily.   . Melatonin 10 MG TABS Take 1 tablet by mouth. 01/03/2020: PRN  . metFORMIN (GLUCOPHAGE) 500 MG tablet Take 2 tablets (1,000 mg total) by mouth 2 (two) times daily.   . Multiple Vitamins-Minerals (MULTIVITAMIN ADULT PO) Take by mouth.   . Multiple Vitamins-Minerals (VISION PLUS PO) Take by mouth.   . omeprazole (PRILOSEC) 40 MG capsule Take 1 capsule (40 mg total) by mouth in the morning and at bedtime.   . ONE TOUCH ULTRA TEST test strip USE TO CHECK BLOOD SUGAR LEVELS THREE TIMES DAILY. DX:E11.9   . sucralfate (CARAFATE) 1 g tablet Take 1 tablet (1 g total) by mouth 2 (two) times daily.   . vitamin E 400 UNIT capsule Take 400 Units by mouth daily.   . Zinc 50 MG TABS Take 50 mg by mouth daily.     No facility-administered encounter medications on file as of 10/15/2020.    Current Diagnosis: Patient Active Problem List   Diagnosis Date Noted  . Stress at work 04/26/2018  . Insomnia due to stress 07/29/2016  . Diabetes mellitus associated with hormonal etiology ([facility]) 08/01/2015  . Hyperlipemia 08/01/2015    BP Readings from Last 3 Encounters:  08/31/20 98/60  01/19/20 105/67  12/21/19 130/70    Lab Results  Component Value Date   HGBA1C 6.9 08/31/2020    Reviewed chart for medication changes ahead of medication coordination call.  No OVs, Consults, or hospital visits since last care coordination call/Pharmacist visit.  Patient obtains medications through Adherence Packaging  90 Days   Last adherence delivery included:  . Atorvastatin 40 mg: Take one tablet daily. . Metformin 500 mg: Take two tablets by mouth twice daily.  . Omeprazole 40 mg: Take one tablet by mouth twice daily.    Patient declined Sucralfate 1gm last month due to having this medication filled and delivered for a 90 day supply on 07-23-2020.   Patient is due for next adherence delivery on: 11/22/2020 Called patient and reviewed medications and coordinated delivery.  Patient is not NEEDING a delivery for the month of December 2021 or January 2022.    Confirmed delivery date   of 11/22/2020, advised patient that pharmacy will contact them the morning of delivery.  Amanda Caudle, LPN Clinical Pharmacist Assistant  [phone]   Follow-Up:  Coordination of Enhanced Pharmacy Services 

## 2020-10-16 ENCOUNTER — Telehealth (INDEPENDENT_AMBULATORY_CARE_PROVIDER_SITE_OTHER): Payer: PPO | Admitting: Family Medicine

## 2020-10-16 ENCOUNTER — Encounter: Payer: Self-pay | Admitting: Family Medicine

## 2020-10-16 ENCOUNTER — Other Ambulatory Visit: Payer: Self-pay

## 2020-10-16 DIAGNOSIS — U071 COVID-19: Secondary | ICD-10-CM

## 2020-10-16 MED ORDER — BENZONATATE 200 MG PO CAPS: 200.0000 mg | ORAL_CAPSULE | Freq: Two times a day (BID) | ORAL | 0 refills | Status: AC | PRN

## 2020-10-16 NOTE — Progress Notes (Signed)
Virtual Visit via Video Note  I connected with [name] on 10/16/20 at  4:20 PM EST by a video enabled telemedicine application and verified that I am speaking with the correct person using two identifiers.  Location: Patient: home Provider: [facility]   I discussed the limitations of evaluation and management by telemedicine and the availability of in person appointments. The patient expressed understanding and agreed to proceed.  History of Present Illness:  UPPER RESPIRATORY TRACT INFECTION - COVID+ 12/27, wife also - symptoms started 12/23 Worst symptom: cough, wheezing, diarrhea Fever: no Cough: yes, nonproductive  Shortness of breath: no Wheezing: no Chest pain: no Chest tightness: no Chest congestion: no Nasal congestion: yes Runny nose: no Sore throat: no Headache: yes Vomiting: no Sick contacts: yes Context: stable Recurrent sinusitis: no Relief with OTC cold/cough medications: hasn't tried  Treatments attempted: tylenol, naproxen, tessalon   Observations/Objective:  Well appearing, in NAD. Speaks in full sentences, no resp distress.   Assessment and Plan:  COVID-19 Positive at home test. Referred for MAB tx given diabetes. OTC symptom relief discussed, rx sent for tessalon. Emergency precautions discussed. Self-isolation instructions given.     I discussed the assessment and treatment plan with the patient. The patient was provided an opportunity to ask questions and all were answered. The patient agreed with the plan and demonstrated an understanding of the instructions.   The patient was advised to call back or seek an in-person evaluation if the symptoms worsen or if the condition fails to improve as anticipated.  I provided 20 minutes of non-face-to-face time during this encounter.   Caro Laroche, DO

## 2020-10-16 NOTE — Assessment & Plan Note (Signed)
Positive at home test. Referred for MAB tx given diabetes. OTC symptom relief discussed, rx sent for tessalon. Emergency precautions discussed. Self-isolation instructions given.

## 2020-10-16 NOTE — Patient Instructions (Signed)
It was great to see you!  Our plans for today:  - Try mucinex with decongestant for your symptoms. I sent a prescription for your tessalon to the pharmacy. - See below for self-isolation guidelines. You may end your quarantine once you are 10 days from symptom onset and fever free for 24 hours without use of tylenol or ibuprofen.  - Someone will be contacting you about antibody infusion treatment. - I recommend getting vaccinated once you are healed from your current infection. If you get the antibody infusion, you must wait  Months before vaccination.  - Certainly, if you are having difficulties breathing or unable to keep down fluids, go to the Emergency Department.   Take care and seek immediate care sooner if you develop any concerns.   Dr. Linwood Dibbles     Person Under Monitoring Name: [name]  Location: [address] [place] [address]   Infection Prevention Recommendations for Individuals Confirmed to have, or Being Evaluated for, 2019 Novel Coronavirus (COVID-19) Infection Who Receive Care at Home  Individuals who are confirmed to have, or are being evaluated for, COVID-19 should follow the prevention steps below until a healthcare provider or local or state health department says they can return to normal activities.  Stay home except to get medical care You should restrict activities outside your home, except for getting medical care. Do not go to work, school, or public areas, and do not use public transportation or taxis.  Call ahead before visiting your doctor Before your medical appointment, call the healthcare provider and tell them that you have, or are being evaluated for, COVID-19 infection. This will help the healthcare provider's office take steps to keep other people from getting infected. Ask your healthcare provider to call the local or state health department.  Monitor your symptoms Seek prompt medical attention if your illness is  worsening (e.g., difficulty breathing). Before going to your medical appointment, call the healthcare provider and tell them that you have, or are being evaluated for, COVID-19 infection. Ask your healthcare provider to call the local or state health department.  Wear a facemask You should wear a facemask that covers your nose and mouth when you are in the same room with other people and when you visit a healthcare provider. People who live with or visit you should also wear a facemask while they are in the same room with you.  Separate yourself from other people in your home As much as possible, you should stay in a different room from other people in your home. Also, you should use a separate bathroom, if available.  Avoid sharing household items You should not share dishes, drinking glasses, cups, eating utensils, towels, bedding, or other items with other people in your home. After using these items, you should wash them thoroughly with soap and water.  Cover your coughs and sneezes Cover your mouth and nose with a tissue when you cough or sneeze, or you can cough or sneeze into your sleeve. Throw used tissues in a lined trash can, and immediately wash your hands with soap and water for at least 20 seconds or use an alcohol-based hand rub.  Wash your [employer] your hands often and thoroughly with soap and water for at least 20 seconds. You can use an alcohol-based hand sanitizer if soap and water are not available and if your hands are not visibly dirty. Avoid touching your eyes, nose, and mouth with unwashed hands.   Prevention Steps  for Caregivers and Household Members of Individuals Confirmed to have, or Being Evaluated for, COVID-19 Infection Being Cared for in the Home  If you live with, or provide care at home for, a person confirmed to have, or being evaluated for, COVID-19 infection please follow these guidelines to prevent infection:  Follow healthcare provider's  instructions Make sure that you understand and can help the patient follow any healthcare provider instructions for all care.  Provide for the patient's basic needs You should help the patient with basic needs in the home and provide support for getting groceries, prescriptions, and other personal needs.  Monitor the patient's symptoms If they are getting sicker, call his or her medical provider and tell them that the patient has, or is being evaluated for, COVID-19 infection. This will help the healthcare provider's office take steps to keep other people from getting infected. Ask the healthcare provider to call the local or state health department.  Limit the number of people who have contact with the patient  If possible, have only one caregiver for the patient.  Other household members should stay in another home or place of residence. If this is not possible, they should stay  in another room, or be separated from the patient as much as possible. Use a separate bathroom, if available.  Restrict visitors who do not have an essential need to be in the home.  Keep older adults, very young children, and other sick people away from the patient Keep older adults, very young children, and those who have compromised immune systems or chronic health conditions away from the patient. This includes people with chronic heart, lung, or kidney conditions, diabetes, and cancer.  Ensure good ventilation Make sure that shared spaces in the home have good air flow, such as from an air conditioner or an opened window, weather permitting.  Wash your hands often  Wash your hands often and thoroughly with soap and water for at least 20 seconds. You can use an alcohol based hand sanitizer if soap and water are not available and if your hands are not visibly dirty.  Avoid touching your eyes, nose, and mouth with unwashed hands.  Use disposable paper towels to dry your hands. If not available, use  dedicated cloth towels and replace them when they become wet.  Wear a facemask and gloves  Wear a disposable facemask at all times in the room and gloves when you touch or have contact with the patient's blood, body fluids, and/or secretions or excretions, such as sweat, saliva, sputum, nasal mucus, vomit, urine, or feces.  Ensure the mask fits over your nose and mouth tightly, and do not touch it during use.  Throw out disposable facemasks and gloves after using them. Do not reuse.  Wash your hands immediately after removing your facemask and gloves.  If your personal clothing becomes contaminated, carefully remove clothing and launder. Wash your hands after handling contaminated clothing.  Place all used disposable facemasks, gloves, and other waste in a lined container before disposing them with other household waste.  Remove gloves and wash your hands immediately after handling these items.  Do not share dishes, glasses, or other household items with the patient  Avoid sharing household items. You should not share dishes, drinking glasses, cups, eating utensils, towels, bedding, or other items with a patient who is confirmed to have, or being evaluated for, COVID-19 infection.  After the person uses these items, you should wash them thoroughly with soap and water.  Wash laundry thoroughly  Immediately remove and wash clothes or bedding that have blood, body fluids, and/or secretions or excretions, such as sweat, saliva, sputum, nasal mucus, vomit, urine, or feces, on them.  Wear gloves when handling laundry from the patient.  Read and follow directions on labels of laundry or clothing items and detergent. In general, wash and dry with the warmest temperatures recommended on the label.  Clean all areas the individual has used often  Clean all touchable surfaces, such as counters, tabletops, doorknobs, bathroom fixtures, toilets, phones, keyboards, tablets, and bedside tables, every  day. Also, clean any surfaces that may have blood, body fluids, and/or secretions or excretions on them.  Wear gloves when cleaning surfaces the patient has come in contact with.  Use a diluted bleach solution (e.g., dilute bleach with 1 part bleach and 10 parts water) or a household disinfectant with a label that says EPA-registered for coronaviruses. To make a bleach solution at home, add 1 tablespoon of bleach to 1 quart (4 cups) of water. For a larger supply, add  cup of bleach to 1 gallon (16 cups) of water.  Read labels of cleaning products and follow recommendations provided on product labels. Labels contain instructions for safe and effective use of the cleaning product including precautions you should take when applying the product, such as wearing gloves or eye protection and making sure you have good ventilation during use of the product.  Remove gloves and wash hands immediately after cleaning.  Monitor yourself for signs and symptoms of illness Caregivers and household members are considered close contacts, should monitor their health, and will be asked to limit movement outside of the home to the extent possible. Follow the monitoring steps for close contacts listed on the symptom monitoring form.   ? If you have additional questions, contact your local health department or call the epidemiologist on call at [phone] (available 24/7). ? This guidance is subject to change. For the most up-to-date guidance from [facility], please refer to their website: [email or web address]

## 2020-10-17 ENCOUNTER — Telehealth: Payer: Self-pay | Admitting: Pharmacist

## 2020-10-17 NOTE — Progress Notes (Signed)
    Chronic Care Management Pharmacy Assistant   Name: [name]  MRN: [id number] DOB: 16-Nov-1955  Reason for Encounter: Follow Up After Positive At Home Covid-19 Test    PCP : Valerie Roys, DO  Allergies:  No Known Allergies  Medications: Outpatient Encounter Medications as of 10/17/2020  Medication Sig Note  . amoxicillin (AMOXIL) 500 MG capsule Take 1 capsule by mouth 3 (three) times daily.   . Ascorbic Acid (VITAMIN C) 1000 MG tablet Take 1,000 mg by mouth daily.   [name] aspirin 81 MG chewable tablet Chew 81 mg by mouth every evening.   [name] atorvastatin (LIPITOR) 40 MG tablet Take 1 tablet (40 mg total) by mouth daily.   . benzonatate (TESSALON) 200 MG capsule Take 1 capsule (200 mg total) by mouth 2 (two) times daily as needed for cough.   . Blood Glucose Monitoring Suppl (ACCU-CHEK AVIVA PLUS) w/Device KIT Use kit to check sugar daily   . Cholecalciferol (VITAMIN D-3 PO) Take 5,000 Units/day by mouth daily.    . clonazePAM (KLONOPIN) 1 MG tablet Take 1 tablet (1 mg total) by mouth daily as needed for anxiety.   . Dulaglutide (TRULICITY) 7.82 NF/6.2ZH SOPN Inject once a week   . empagliflozin (JARDIANCE) 25 MG TABS tablet Take 1 tablet (25 mg total) by mouth daily.   . Melatonin 10 MG TABS Take 1 tablet by mouth. 01/03/2020: PRN  . metFORMIN (GLUCOPHAGE) 500 MG tablet Take 2 tablets (1,000 mg total) by mouth 2 (two) times daily.   . Multiple Vitamins-Minerals (MULTIVITAMIN ADULT PO) Take by mouth.   . Multiple Vitamins-Minerals (VISION PLUS PO) Take by mouth.   [name] omeprazole (PRILOSEC) 40 MG capsule Take 1 capsule (40 mg total) by mouth in the morning and at bedtime.   . ONE TOUCH ULTRA TEST test strip USE TO CHECK BLOOD SUGAR LEVELS THREE TIMES DAILY. DX:E11.9   . sucralfate (CARAFATE) 1 g tablet Take 1 tablet (1 g total) by mouth 2 (two) times daily.   . traMADol (ULTRAM) 50 MG tablet Take 50-100 mg by mouth 4 (four) times daily as needed.   . vitamin E 400 UNIT capsule Take  400 Units by mouth daily.   . Zinc 50 MG TABS Take 50 mg by mouth daily.    No facility-administered encounter medications on file as of 10/17/2020.    Current Diagnosis: Patient Active Problem List   Diagnosis Date Noted  . COVID-19 10/16/2020  . Stress at work 04/26/2018  . Insomnia due to stress 07/29/2016  . Diabetes mellitus associated with hormonal etiology ([place]) 08/01/2015  . Hyperlipemia 08/01/2015   Called that patient to follow up after a positive COVID-19 at-home test. Spoke to the patient and wife [name]). Per the patient; "I'm not as bad as my wife." Noted coughing but no audible wheezing or shortness of breath was noted. A referral was placed for MAB treatment due to the patient's diabetes. No appointment date has been provided per the patient. Discussed OTC symptom relief and was prescribed Tessalon. Reviewed self-isolation and when to call 911. The patient verbalized understanding of information.   Cloretta Ned, LPN Clinical Pharmacist Assistant  [phone]     Follow-Up:  Pharmacist Review

## 2020-10-22 ENCOUNTER — Telehealth: Payer: Self-pay | Admitting: Family Medicine

## 2020-10-22 DIAGNOSIS — Z7982 Long term (current) use of aspirin: Secondary | ICD-10-CM | POA: Diagnosis not present

## 2020-10-22 DIAGNOSIS — E7800 Pure hypercholesterolemia, unspecified: Secondary | ICD-10-CM | POA: Diagnosis not present

## 2020-10-22 DIAGNOSIS — R197 Diarrhea, unspecified: Secondary | ICD-10-CM | POA: Diagnosis not present

## 2020-10-22 DIAGNOSIS — R0902 Hypoxemia: Secondary | ICD-10-CM | POA: Diagnosis not present

## 2020-10-22 DIAGNOSIS — Z794 Long term (current) use of insulin: Secondary | ICD-10-CM | POA: Diagnosis not present

## 2020-10-22 DIAGNOSIS — E1165 Type 2 diabetes mellitus with hyperglycemia: Secondary | ICD-10-CM | POA: Diagnosis not present

## 2020-10-22 DIAGNOSIS — E119 Type 2 diabetes mellitus without complications: Secondary | ICD-10-CM | POA: Diagnosis not present

## 2020-10-22 DIAGNOSIS — Z7901 Long term (current) use of anticoagulants: Secondary | ICD-10-CM | POA: Diagnosis not present

## 2020-10-22 DIAGNOSIS — F419 Anxiety disorder, unspecified: Secondary | ICD-10-CM | POA: Diagnosis not present

## 2020-10-22 DIAGNOSIS — D6859 Other primary thrombophilia: Secondary | ICD-10-CM | POA: Diagnosis not present

## 2020-10-22 DIAGNOSIS — J9601 Acute respiratory failure with hypoxia: Secondary | ICD-10-CM | POA: Diagnosis not present

## 2020-10-22 DIAGNOSIS — Z7984 Long term (current) use of oral hypoglycemic drugs: Secondary | ICD-10-CM | POA: Diagnosis not present

## 2020-10-22 DIAGNOSIS — I10 Essential (primary) hypertension: Secondary | ICD-10-CM | POA: Diagnosis not present

## 2020-10-22 DIAGNOSIS — R300 Dysuria: Secondary | ICD-10-CM | POA: Diagnosis not present

## 2020-10-22 DIAGNOSIS — H3530 Unspecified macular degeneration: Secondary | ICD-10-CM | POA: Diagnosis not present

## 2020-10-22 DIAGNOSIS — E785 Hyperlipidemia, unspecified: Secondary | ICD-10-CM | POA: Diagnosis not present

## 2020-10-22 DIAGNOSIS — J1282 Pneumonia due to coronavirus disease 2019: Secondary | ICD-10-CM | POA: Diagnosis not present

## 2020-10-22 DIAGNOSIS — U071 COVID-19: Secondary | ICD-10-CM | POA: Diagnosis not present

## 2020-10-22 DIAGNOSIS — R7401 Elevation of levels of liver transaminase levels: Secondary | ICD-10-CM | POA: Diagnosis not present

## 2020-10-22 DIAGNOSIS — R0600 Dyspnea, unspecified: Secondary | ICD-10-CM | POA: Diagnosis not present

## 2020-10-22 DIAGNOSIS — Z79899 Other long term (current) drug therapy: Secondary | ICD-10-CM | POA: Diagnosis not present

## 2020-10-22 DIAGNOSIS — R918 Other nonspecific abnormal finding of lung field: Secondary | ICD-10-CM | POA: Diagnosis not present

## 2020-10-22 NOTE — Telephone Encounter (Signed)
Please have him call the COVID infusion number [phone]. I'm not sure if he has been referred but this should get him in touch with the people who can schedule him.

## 2020-10-22 NOTE — Telephone Encounter (Signed)
As per spouse, [name]. [place]  states that Dr. Linwood Dibbles recommended patient to have COVID infusions and would like a follow up call as soon as possible.

## 2020-10-22 NOTE — Telephone Encounter (Signed)
Per last note they were referred. They are outside the treatment window as they got symptoms on 12/23- needs to be within a week. They ikely did not hear from the MAB infusion clinic as they did not qualify 

## 2020-10-22 NOTE — Telephone Encounter (Signed)
Called and notified patients wife of Dr.Johnson's response, patient asked me what they needed to do since they could not get the infusion, I informed her that if her husbands oxygen is in the 80's he needs to go to the ER, patient states that they were told to not come to the er if they have COVID, I told patient that with her husbands oxygen he need to go to the ER to be evaluated. Reported this to my Production designer, theatre/television/film.

## 2020-10-22 NOTE — Telephone Encounter (Signed)
Can you place referral please? 

## 2020-10-23 NOTE — Telephone Encounter (Signed)
Agree, if patient is hypoxic and requiring supplemental oxygen, he should present to the ER.  Patient and husband referred for MAB tx via MAB hotline email per protocol on 12/28. Discussed with Laurelyn Sickle, email forwarded.

## 2020-11-06 ENCOUNTER — Telehealth: Payer: Self-pay | Admitting: Pharmacist

## 2020-11-06 NOTE — Progress Notes (Addendum)
    Chronic Care Management Pharmacy Assistant   Name: [name]  MRN: [id number] DOB: [birth year]  Reason for Encounter: Patient Assistance Applications    PCP : Valerie Roys, DO  Allergies:  No Known Allergies  Medications: Outpatient Encounter Medications as of 11/06/2020  Medication Sig Note   amoxicillin (AMOXIL) 500 MG capsule Take 1 capsule by mouth 3 (three) times daily.    Ascorbic Acid (VITAMIN C) 1000 MG tablet Take 1,000 mg by mouth daily.    aspirin 81 MG chewable tablet Chew 81 mg by mouth every evening.    atorvastatin (LIPITOR) 40 MG tablet Take 1 tablet (40 mg total) by mouth daily.    benzonatate (TESSALON) 200 MG capsule Take 1 capsule (200 mg total) by mouth 2 (two) times daily as needed for cough.    Blood Glucose Monitoring Suppl (ACCU-CHEK AVIVA PLUS) w/Device KIT Use kit to check sugar daily    Cholecalciferol (VITAMIN D-3 PO) Take 5,000 Units/day by mouth daily.     clonazePAM (KLONOPIN) 1 MG tablet Take 1 tablet (1 mg total) by mouth daily as needed for anxiety.    Dulaglutide (TRULICITY) 2.95 MW/4.1LK SOPN Inject once a week    empagliflozin (JARDIANCE) 25 MG TABS tablet Take 1 tablet (25 mg total) by mouth daily.    Melatonin 10 MG TABS Take 1 tablet by mouth. 01/03/2020: PRN   metFORMIN (GLUCOPHAGE) 500 MG tablet Take 2 tablets (1,000 mg total) by mouth 2 (two) times daily.    Multiple Vitamins-Minerals (MULTIVITAMIN ADULT PO) Take by mouth.    Multiple Vitamins-Minerals (VISION PLUS PO) Take by mouth.    omeprazole (PRILOSEC) 40 MG capsule Take 1 capsule (40 mg total) by mouth in the morning and at bedtime.    ONE TOUCH ULTRA TEST test strip USE TO CHECK BLOOD SUGAR LEVELS THREE TIMES DAILY. DX:E11.9    sucralfate (CARAFATE) 1 g tablet Take 1 tablet (1 g total) by mouth 2 (two) times daily.    traMADol (ULTRAM) 50 MG tablet Take 50-100 mg by mouth 4 (four) times daily as needed.    vitamin E 400 UNIT capsule Take 400 Units by mouth daily.    Zinc  50 MG TABS Take 50 mg by mouth daily.    No facility-administered encounter medications on file as of 11/06/2020.    Current Diagnosis: Patient Active Problem List   Diagnosis Date Noted   COVID-19 10/16/2020   Stress at work 04/26/2018   Insomnia due to stress 07/29/2016   Diabetes mellitus associated with hormonal etiology ([place]) 08/01/2015   Hyperlipemia 08/01/2015    11-06-20: The patient has 2 patient assistance application for Jardiance and Trulicty. The patient's endocrinologist [facility]) prescribes all his diabetic medications. Per the endocrinologist office the patient's applications for Jardiance and Trulicty were needing to be updated. Both applications were updated as requested. Both applications will be faxed back to [facility] for [name] to sign and fax to patient assistance program.    Cloretta Ned, LPN Clinical Pharmacist Assistant  [phone]    Follow-Up:  CPA to follow up with Endocrinologist office on status of applications.   I have reviewed the care management and care coordination activities outlined in this encounter and I am certifying that I agree with the content of this note.  Junita Push. Kenton Kingfisher PharmD, [facility] [phone]

## 2020-11-13 ENCOUNTER — Telehealth: Payer: Self-pay | Admitting: Family Medicine

## 2020-11-13 NOTE — Telephone Encounter (Signed)
Copied from CRM [id number]. Topic: Medicare AWV >> Nov 13, 2020  1:54 PM Geraldine Contras wrote: Reason for CRM:   Left message : AWVS scheduled Feb ,2022 has been cancelled and rescheduled to be done by telephone (not in office) on Nov 19, 2020 at 1:00 pm.

## 2020-11-19 ENCOUNTER — Ambulatory Visit (INDEPENDENT_AMBULATORY_CARE_PROVIDER_SITE_OTHER): Payer: HMO

## 2020-11-19 VITALS — Ht 71.0 in | Wt 162.0 lb

## 2020-11-19 DIAGNOSIS — Z0000 Encounter for general adult medical examination without abnormal findings: Secondary | ICD-10-CM | POA: Diagnosis not present

## 2020-11-19 NOTE — Progress Notes (Signed)
I connected with [name] today by telephone and verified that I am speaking with the correct person using two identifiers. Location patient: home Location provider: work Persons participating in the virtual visit: [name], [name] LPN.   I discussed the limitations, risks, security and privacy concerns of performing an evaluation and management service by telephone and the availability of in person appointments. I also discussed with the patient that there may be a patient responsible charge related to this service. The patient expressed understanding and verbally consented to this telephonic visit.    Interactive audio and video telecommunications were attempted between this provider and patient, however failed, due to patient having technical difficulties OR patient did not have access to video capability.  We continued and completed visit with audio only.     Vital signs may be patient reported or missing.  Subjective:   [name] is a 65 y.o. male who presents for Medicare Annual/Subsequent preventive examination.  Review of Systems     Cardiac Risk Factors include: advanced age (>6mn, >>84women);diabetes mellitus     Objective:    Today's Vitals   11/19/20 1333  Weight: 162 lb (73.5 kg)  Height: '5\' 11"'  (1.803 m)   Body mass index is 22.59 kg/m.  Advanced Directives 11/19/2020 12/21/2019 09/18/2015  Does Patient Have a Medical Advance Directive? Yes Yes Yes  Type of [place] Living will;Healthcare Power of [place]  Copy of [place] Chart? No - copy requested No - copy requested -    Current Medications (verified) Outpatient Encounter Medications as of 11/19/2020  Medication Sig  . Ascorbic Acid (VITAMIN C) 1000 MG tablet Take 1,000 mg by mouth daily.  .Marland Kitchenaspirin 81 MG chewable tablet Chew 81 mg by mouth every evening.  .Marland Kitchenatorvastatin  (LIPITOR) 40 MG tablet Take 1 tablet (40 mg total) by mouth daily.  . benzonatate (TESSALON) 200 MG capsule Take 1 capsule (200 mg total) by mouth 2 (two) times daily as needed for cough.  . Blood Glucose Monitoring Suppl (ACCU-CHEK AVIVA PLUS) w/Device KIT Use kit to check sugar daily  . Cholecalciferol (VITAMIN D-3 PO) Take 5,000 Units/day by mouth daily.   . clonazePAM (KLONOPIN) 1 MG tablet Take 1 tablet (1 mg total) by mouth daily as needed for anxiety.  . Dulaglutide (TRULICITY) 08.34MHD/6.2IWSOPN Inject once a week  . empagliflozin (JARDIANCE) 25 MG TABS tablet Take 1 tablet (25 mg total) by mouth daily.  . Melatonin 10 MG TABS Take 1 tablet by mouth.  . metFORMIN (GLUCOPHAGE) 500 MG tablet Take 2 tablets (1,000 mg total) by mouth 2 (two) times daily.  . Multiple Vitamins-Minerals (MULTIVITAMIN ADULT PO) Take by mouth.  . Multiple Vitamins-Minerals (VISION PLUS PO) Take by mouth.  .Marland Kitchenomeprazole (PRILOSEC) 40 MG capsule Take 1 capsule (40 mg total) by mouth in the morning and at bedtime.  . ONE TOUCH ULTRA TEST test strip USE TO CHECK BLOOD SUGAR LEVELS THREE TIMES DAILY. DX:E11.9  . sucralfate (CARAFATE) 1 g tablet Take 1 tablet (1 g total) by mouth 2 (two) times daily.  . traMADol (ULTRAM) 50 MG tablet Take 50-100 mg by mouth 4 (four) times daily as needed.  . vitamin E 400 UNIT capsule Take 400 Units by mouth daily.  . Zinc 50 MG TABS Take 50 mg by mouth daily.  .Marland Kitchenamoxicillin (AMOXIL) 500 MG capsule Take 1 capsule by mouth 3 (three)  times daily. (Patient not taking: Reported on 11/19/2020)   No facility-administered encounter medications on file as of 11/19/2020.    Allergies (verified) Patient has no known allergies.   History: Past Medical History:  Diagnosis Date  . Colon polyps   . Diabetes mellitus without complication ([place])   . Irregular heart rhythm   . Kidney stone   . Meniscus tear    left knee  . Pneumothorax   . Pneumothorax   . Rheumatic fever    Past Surgical  History:  Procedure Laterality Date  . APPENDECTOMY    . KNEE ARTHROSCOPY WITH MEDIAL MENISECTOMY Left 09/24/2015   Procedure: KNEE ARTHROSCOPY WITH partial MEDIAL MENISECTOMY, chondroplasty;  Surgeon: Dereck Leep, MD;  Location: [facility] ORS;  Service: Orthopedics;  Laterality: Left;  . LITHOTRIPSY     Family History  Problem Relation Age of Onset  . Heart disease Mother   . Thyroid disease Mother   . Diabetes Mother   . Cancer Sister        lung  . Diabetes Brother   . Diabetes Maternal Grandmother    Social History   Socioeconomic History  . Marital status: Married    Spouse name: Not on file  . Number of children: Not on file  . Years of education: Not on file  . Highest education level: Not on file  Occupational History  . Occupation: retired   Tobacco Use  . Smoking status: Former Smoker    Types: Cigarettes    Quit date: 10/20/1988    Years since quitting: 32.1  . Smokeless tobacco: Former Systems developer    Types: [place] date: 1994  Media planner  . Vaping Use: Never used  Substance and Sexual Activity  . Alcohol use: No    Alcohol/week: 0.0 standard drinks  . Drug use: No  . Sexual activity: Not on file  Other Topics Concern  . Not on file  Social History Narrative  . Not on file   Social Determinants of Health   Financial Resource Strain: Low Risk   . Difficulty of Paying Living Expenses: Not hard at all  Food Insecurity: No Food Insecurity  . Worried About Charity fundraiser in the Last Year: Never true  . Ran Out of Food in the Last Year: Never true  Transportation Needs: No Transportation Needs  . Lack of Transportation (Medical): No  . Lack of Transportation (Non-Medical): No  Physical Activity: Sufficiently Active  . Days of Exercise per Week: 7 days  . Minutes of Exercise per Session: 30 min  Stress: No Stress Concern Present  . Feeling of Stress : Not at all  Social Connections: Not on file    Tobacco Counseling Counseling given: Not  Answered   Clinical Intake:  Pre-visit preparation completed: Yes  Pain : No/denies pain     Nutritional Status: BMI of 19-24  Normal Nutritional Risks: None Diabetes: Yes  How often do you need to have someone help you when you read instructions, pamphlets, or other written materials from your doctor or pharmacy?: 1 - Never What is the last grade level you completed in school?: 20yrcollege  Diabetic? Yes Nutrition Risk Assessment:  Has the patient had any N/V/D within the last 2 months?  No  Does the patient have any non-healing wounds?  No  Has the patient had any unintentional weight loss or weight gain?  Yes   Diabetes:  Is the patient diabetic?  Yes  If diabetic,  was a CBG obtained today?  No  Did the patient bring in their glucometer from home?  No  How often do you monitor your CBG's? 2 daily.   Financial Strains and Diabetes Management:  Are you having any financial strains with the device, your supplies or your medication? No .  Does the patient want to be seen by Chronic Care Management for management of their diabetes?  No  Would the patient like to be referred to a Nutritionist or for Diabetic Management?  No   Diabetic Exams:  Diabetic Eye Exam: Overdue for diabetic eye exam. Pt has been advised about the importance in completing this exam. Patient advised to call and schedule an eye exam. Diabetic Foot Exam: Completed 08/31/2020   Interpreter Needed?: No  Information entered by :: NAllen LPN   Activities of Daily Living In your present state of health, do you have any difficulty performing the following activities: 11/19/2020 12/21/2019  Hearing? Y N  Comment a little no hearing aids  Vision? N Y  Comment - reading glasses, [place] [facility]  Difficulty concentrating or making decisions? N N  Walking or climbing stairs? Y N  Comment some times has to stop while climbing -  Dressing or bathing? N N  Doing errands, shopping? N N  Preparing Food and  eating ? N N  Using the Toilet? N N  In the past six months, have you accidently leaked urine? N N  Do you have problems with loss of bowel control? N N  Managing your Medications? N N  Managing your Finances? N N  Housekeeping or managing your Housekeeping? N N  Some recent data might be hidden    Patient Care Team: Valerie Roys, DO as PCP - General (Family Medicine) Vladimir Faster, [facility] as Pharmacist (Pharmacist)  Indicate any recent Medical Services you may have received from other than [facility] providers in the past year (date may be approximate).     Assessment:   This is a routine wellness examination for Kyrus.  Hearing/Vision screen  Hearing Screening   '125Hz'  '250Hz'  '500Hz'  '1000Hz'  '2000Hz'  '3000Hz'  '4000Hz'  '6000Hz'  '8000Hz'   Right ear:           Left ear:           Vision Screening Comments: Regular eye exams, Dr. Joya San  Dietary issues and exercise activities discussed: Current Exercise Habits: Home exercise routine, Type of exercise: walking, Time (Minutes): 30, Frequency (Times/Week): 7, Weekly Exercise (Minutes/Week): 210  Goals    .  "We want to make sure his medications are OK" (pt-stated)      [place] (see longtitudinal plan of care for additional care plan information)  Current Barriers:  . Diabetes:  . controlled; most recent A1c 6.5% . Current antihyperglycemic regimen: Jardiance 25 mg daily, Trulicity 5.17 mg once weekly, metformin 1000 mg qam 500 mg qpm o APPROVED for Jardiance assistance from [place] through 10/19/2020.  o APPROVED for Trulicity assistance from [place] through 10/19/2020 o Verified they were able to order refills. Will need to reapply. Patient follows with endocrinology. o Reports needing new glucometer. Will ask prescriber to send new RX . Current glucose readings:  o Fasting: 140 o Afternoons: 144- 165 Hyperlipidemia:  . Cardiovascular risk reduction: o Current hypertensive regimen: n/a; BP well controlled at <130/80 at office visits;  UACR <30 on last check  o Current hyperlipidemia regimen: atorvastatin 40 mg daily; LDL not quite at goal <70 ASCVD risk 23.5% o  Pharmacist Clinical  Goal(s):  [name] Over the next 90 days, patient with work with PharmD and primary care provider to address optimized self-management of diabetes  Interventions: . Comprehensive medication review performed, medication list updated in electronic medical record . Encouraged continued glucose checks and medication adherence.  . Encouraged continued adherence to medication timing as recommended by GI.  [name] Patient given information regarding [facility] and would like to switch to take advantage of packaging and delivery.  Patient Self Care Activities:  . Patient will check blood glucose daily , document, and provide at future appointments . Patient will take medications as prescribed . Patient will contact provider with any episodes of hypoglycemia . Patient will report any questions or concerns to provider  . Patient will provide required portion of PAP application.  Please see past updates related to this goal by clicking on the "Past Updates" button in the selected goal       .  Patient Stated      11/19/2020, wants to get better      Depression Screen PHQ 2/9 Scores 11/19/2020 08/31/2020 12/21/2019 01/06/2018 09/24/2017 06/29/2017 06/29/2017  PHQ - 2 Score 0 0 0 0 0 0 0  PHQ- 9 Score - - - 2 - - -    Fall Risk Fall Risk  11/19/2020 08/31/2020 12/21/2019 09/14/2019 01/06/2018  Falls in the past year? 0 0 0 0 No  Comment - - - [place] Telephone Survey: data to providers prior to load -  Number falls in past yr: - - 0 - -  Injury with Fall? - - 0 - -  Risk for fall due to : Medication side effect - - - -  Follow up Falls evaluation completed;Education provided;Falls prevention discussed - - - -    FALL RISK PREVENTION PERTAINING TO THE HOME:  Any stairs in or around the home? Yes  If so, are there any without handrails? No  Home free of  loose throw rugs in walkways, pet beds, electrical cords, etc? Yes  Adequate lighting in your home to reduce risk of falls? Yes   ASSISTIVE DEVICES UTILIZED TO PREVENT FALLS:  Life alert? No  Use of a cane, walker or w/c? No  Grab bars in the bathroom? Yes  Shower chair or bench in shower? Yes  Elevated toilet seat or a handicapped toilet? Yes   TIMED UP AND GO:  Was the test performed? No .   Cognitive Function:     6CIT Screen 11/19/2020  What Year? 0 points  What month? 0 points  What time? 0 points  Count back from 20 0 points  Months in reverse 0 points  Repeat phrase 0 points  Total Score 0    Immunizations Immunization History  Administered Date(s) Administered  . Fluad Quad(high Dose 65+) 09/07/2019, 08/31/2020  . Influenza,inj,Quad PF,6+ Mos 07/29/2016, 06/29/2017  . Influenza-Unspecified 07/20/2015, 07/29/2016, 06/29/2017, 07/20/2018  . Pneumococcal-Unspecified 01/19/1999, 04/17/2005  . Td 01/22/2009  . Zoster 11/20/2016    TDAP status: Due, Education has been provided regarding the importance of this vaccine. Advised may receive this vaccine at local pharmacy or Health Dept. Aware to provide a copy of the vaccination record if obtained from local pharmacy or Health Dept. Verbalized acceptance and understanding.  Flu Vaccine status: Up to date  Pneumococcal vaccine status: Due, Education has been provided regarding the importance of this vaccine. Advised may receive this vaccine at local pharmacy or Health Dept. Aware to provide a copy of the vaccination record if obtained from  local pharmacy or Health Dept. Verbalized acceptance and understanding.  Covid-19 vaccine status: Declined, Education has been provided regarding the importance of this vaccine but patient still declined. Advised may receive this vaccine at local pharmacy or Health Dept.or vaccine clinic. Aware to provide a copy of the vaccination record if obtained from local pharmacy or Health Dept.  Verbalized acceptance and understanding.  Qualifies for Shingles Vaccine? Yes   Zostavax completed Yes   Shingrix Completed?: No.    Education has been provided regarding the importance of this vaccine. Patient has been advised to call insurance company to determine out of pocket expense if they have not yet received this vaccine. Advised may also receive vaccine at local pharmacy or Health Dept. Verbalized acceptance and understanding.  Screening Tests Health Maintenance  Topic Date Due  . COVID-19 Vaccine (1) Never done  . OPHTHALMOLOGY EXAM  03/22/2020  . TETANUS/TDAP  12/20/2020 (Originally 01/23/2019)  . PNA vac Low Risk Adult (1 of 2 - PCV13) 01/18/2021 (Originally 12/15/2018)  . HEMOGLOBIN A1C  02/28/2021  . FOOT EXAM  08/31/2021  . URINE MICROALBUMIN  08/31/2021  . COLONOSCOPY (Pts 45-12yr Insurance coverage will need to be confirmed)  03/14/2024  . INFLUENZA VACCINE  Completed  . Hepatitis C Screening  Completed    Health Maintenance  Health Maintenance Due  Topic Date Due  . COVID-19 Vaccine (1) Never done  . OPHTHALMOLOGY EXAM  03/22/2020    Colorectal cancer screening: Type of screening: Colonoscopy. Completed 03/15/2019. Repeat every 5 years  Lung Cancer Screening: (Low Dose CT Chest recommended if Age 67-80years, 30 pack-year currently smoking OR have quit w/in 15years.) does not qualify.   Lung Cancer Screening Referral: no  Additional Screening:  Hepatitis C Screening: does qualify; Completed 07/29/2016  Vision Screening: Recommended annual ophthalmology exams for early detection of glaucoma and other disorders of the eye. Is the patient up to date with their annual eye exam?  No  Who is the provider or what is the name of the office in which the patient attends annual eye exams? Dr. TJoya SanIf pt is not established with a provider, would they like to be referred to a provider to establish care? No .   Dental Screening: Recommended annual dental exams for  proper oral hygiene  Community Resource Referral / Chronic Care Management: CRR required this visit?  No   CCM required this visit?  No      Plan:     I have personally reviewed and noted the following in the patient's chart:   . Medical and social history . Use of alcohol, tobacco or illicit drugs  . Current medications and supplements . Functional ability and status . Nutritional status . Physical activity . Advanced directives . List of other physicians . Hospitalizations, surgeries, and ER visits in previous 12 months . Vitals . Screenings to include cognitive, depression, and falls . Referrals and appointments  In addition, I have reviewed and discussed with patient certain preventive protocols, quality metrics, and best practice recommendations. A written personalized care plan for preventive services as well as general preventive health recommendations were provided to patient.     NKellie Simmering LPN   [id number]  Nurse Notes:

## 2020-11-19 NOTE — Patient Instructions (Signed)
[name] , Thank you for taking time to come for your Medicare Wellness Visit. I appreciate your ongoing commitment to your health goals. Please review the following plan we discussed and let me know if I can assist you in the future.   Screening recommendations/referrals: Colonoscopy: completed 03/15/2019, due 03/14/2024 Recommended yearly ophthalmology/optometry visit for glaucoma screening and checkup Recommended yearly dental visit for hygiene and checkup  Vaccinations: Influenza vaccine: completed 08/31/2020, due 05/20/2021 Pneumococcal vaccine: due Tdap vaccine: due Shingles vaccine:  discussed   Covid-19:  decline  Advanced directives: Please bring a copy of your POA (Power of Attorney) and/or Living Will to your next appointment.   Conditions/risks identified: none  Next appointment: Follow up in one year for your annual wellness visit.   Preventive Care 67 Years and Older, Male Preventive care refers to lifestyle choices and visits with your health care provider that can promote health and wellness. What does preventive care include?  A yearly physical exam. This is also called an annual well check.  Dental exams once or twice a year.  Routine eye exams. Ask your health care provider how often you should have your eyes checked.  Personal lifestyle choices, including:  Daily care of your teeth and gums.  Regular physical activity.  Eating a healthy diet.  Avoiding tobacco and drug use.  Limiting alcohol use.  Practicing safe sex.  Taking low doses of aspirin every day.  Taking vitamin and mineral supplements as recommended by your health care provider. What happens during an annual well check? The services and screenings done by your health care provider during your annual well check will depend on your age, overall health, lifestyle risk factors, and family history of disease. Counseling  Your health care provider may ask you questions about your:  Alcohol  use.  Tobacco use.  Drug use.  Emotional well-being.  Home and relationship well-being.  Sexual activity.  Eating habits.  History of falls.  Memory and ability to understand (cognition).  Work and work Astronomer. Screening  You may have the following tests or measurements:  Height, weight, and BMI.  Blood pressure.  Lipid and cholesterol levels. These may be checked every 5 years, or more frequently if you are over 97 years old.  Skin check.  Lung cancer screening. You may have this screening every year starting at age 66 if you have a 30-pack-year history of smoking and currently smoke or have quit within the past 15 years.  Fecal occult blood test (FOBT) of the stool. You may have this test every year starting at age 77.  Flexible sigmoidoscopy or colonoscopy. You may have a sigmoidoscopy every 5 years or a colonoscopy every 10 years starting at age 1.  Prostate cancer screening. Recommendations will vary depending on your family history and other risks.  Hepatitis C blood test.  Hepatitis B blood test.  Sexually transmitted disease (STD) testing.  Diabetes screening. This is done by checking your blood sugar (glucose) after you have not eaten for a while (fasting). You may have this done every 1-3 years.  Abdominal aortic aneurysm (AAA) screening. You may need this if you are a current or former smoker.  Osteoporosis. You may be screened starting at age 99 if you are at high risk. Talk with your health care provider about your test results, treatment options, and if necessary, the need for more tests. Vaccines  Your health care provider may recommend certain vaccines, such as:  Influenza vaccine. This is recommended every  year.  Tetanus, diphtheria, and acellular pertussis (Tdap, Td) vaccine. You may need a Td booster every 10 years.  Zoster vaccine. You may need this after age 42.  Pneumococcal 13-valent conjugate (PCV13) vaccine. One dose is  recommended after age 19.  Pneumococcal polysaccharide (PPSV23) vaccine. One dose is recommended after age 72. Talk to your health care provider about which screenings and vaccines you need and how often you need them. This information is not intended to replace advice given to you by your health care provider. Make sure you discuss any questions you have with your health care provider. Document Released: 11/02/2015 Document Revised: 06/25/2016 Document Reviewed: 08/07/2015 Elsevier Interactive Patient Education  2017 [place] Prevention in the Home Falls can cause injuries. They can happen to people of all ages. There are many things you can do to make your home safe and to help prevent falls. What can I do on the outside of my home?  Regularly fix the edges of walkways and driveways and fix any cracks.  Remove anything that might make you trip as you walk through a door, such as a raised step or threshold.  Trim any bushes or trees on the path to your home.  Use bright outdoor lighting.  Clear any walking paths of anything that might make someone trip, such as rocks or tools.  Regularly check to see if handrails are loose or broken. Make sure that both sides of any steps have handrails.  Any raised decks and porches should have guardrails on the edges.  Have any leaves, snow, or ice cleared regularly.  Use sand or salt on walking paths during winter.  Clean up any spills in your garage right away. This includes oil or grease spills. What can I do in the bathroom?  Use night lights.  Install grab bars by the toilet and in the tub and shower. Do not use towel bars as grab bars.  Use non-skid mats or decals in the tub or shower.  If you need to sit down in the shower, use a plastic, non-slip stool.  Keep the floor dry. Clean up any water that spills on the floor as soon as it happens.  Remove soap buildup in the tub or shower regularly.  Attach bath mats  securely with double-sided non-slip rug tape.  Do not have throw rugs and other things on the floor that can make you trip. What can I do in the bedroom?  Use night lights.  Make sure that you have a light by your bed that is easy to reach.  Do not use any sheets or blankets that are too big for your bed. They should not hang down onto the floor.  Have a firm chair that has side arms. You can use this for support while you get dressed.  Do not have throw rugs and other things on the floor that can make you trip. What can I do in the kitchen?  Clean up any spills right away.  Avoid walking on wet floors.  Keep items that you use a lot in easy-to-reach places.  If you need to reach something above you, use a strong step stool that has a grab bar.  Keep electrical cords out of the way.  Do not use floor polish or wax that makes floors slippery. If you must use wax, use non-skid floor wax.  Do not have throw rugs and other things on the floor that can make you trip. What can  I do with my stairs?  Do not leave any items on the stairs.  Make sure that there are handrails on both sides of the stairs and use them. Fix handrails that are broken or loose. Make sure that handrails are as long as the stairways.  Check any carpeting to make sure that it is firmly attached to the stairs. Fix any carpet that is loose or worn.  Avoid having throw rugs at the top or bottom of the stairs. If you do have throw rugs, attach them to the floor with carpet tape.  Make sure that you have a light switch at the top of the stairs and the bottom of the stairs. If you do not have them, ask someone to add them for you. What else can I do to help prevent falls?  Wear shoes that:  Do not have high heels.  Have rubber bottoms.  Are comfortable and fit you well.  Are closed at the toe. Do not wear sandals.  If you use a stepladder:  Make sure that it is fully opened. Do not climb a closed  stepladder.  Make sure that both sides of the stepladder are locked into place.  Ask someone to hold it for you, if possible.  Clearly mark and make sure that you can see:  Any grab bars or handrails.  First and last steps.  Where the edge of each step is.  Use tools that help you move around (mobility aids) if they are needed. These include:  Canes.  Walkers.  Scooters.  Crutches.  Turn on the lights when you go into a dark area. Replace any light bulbs as soon as they burn out.  Set up your furniture so you have a clear path. Avoid moving your furniture around.  If any of your floors are uneven, fix them.  If there are any pets around you, be aware of where they are.  Review your medicines with your doctor. Some medicines can make you feel dizzy. This can increase your chance of falling. Ask your doctor what other things that you can do to help prevent falls. This information is not intended to replace advice given to you by your health care provider. Make sure you discuss any questions you have with your health care provider. Document Released: 08/02/2009 Document Revised: 03/13/2016 Document Reviewed: 11/10/2014 Elsevier Interactive Patient Education  2017 [employer].

## 2020-11-20 ENCOUNTER — Telehealth: Payer: Self-pay | Admitting: Pharmacist

## 2020-11-20 NOTE — Progress Notes (Addendum)
Chronic Care Management Pharmacy Assistant   Name: [name]  MRN: [id number] DOB: 1956-07-30  Reason for Encounter: Coordination of Medication Delivery    PCP : Valerie Roys, DO  Allergies:  No Known Allergies  Medications: Outpatient Encounter Medications as of 11/20/2020  Medication Sig Note   amoxicillin (AMOXIL) 500 MG capsule Take 1 capsule by mouth 3 (three) times daily. (Patient not taking: Reported on 11/19/2020)    Ascorbic Acid (VITAMIN C) 1000 MG tablet Take 1,000 mg by mouth daily.    aspirin 81 MG chewable tablet Chew 81 mg by mouth every evening.    atorvastatin (LIPITOR) 40 MG tablet Take 1 tablet (40 mg total) by mouth daily.    benzonatate (TESSALON) 200 MG capsule Take 1 capsule (200 mg total) by mouth 2 (two) times daily as needed for cough.    Blood Glucose Monitoring Suppl (ACCU-CHEK AVIVA PLUS) w/Device KIT Use kit to check sugar daily    Cholecalciferol (VITAMIN D-3 PO) Take 5,000 Units/day by mouth daily.     clonazePAM (KLONOPIN) 1 MG tablet Take 1 tablet (1 mg total) by mouth daily as needed for anxiety.    Dulaglutide (TRULICITY) 4.09 WJ/1.9JY SOPN Inject once a week    empagliflozin (JARDIANCE) 25 MG TABS tablet Take 1 tablet (25 mg total) by mouth daily.    Melatonin 10 MG TABS Take 1 tablet by mouth. 01/03/2020: PRN   metFORMIN (GLUCOPHAGE) 500 MG tablet Take 2 tablets (1,000 mg total) by mouth 2 (two) times daily.    Multiple Vitamins-Minerals (MULTIVITAMIN ADULT PO) Take by mouth.    Multiple Vitamins-Minerals (VISION PLUS PO) Take by mouth.    omeprazole (PRILOSEC) 40 MG capsule Take 1 capsule (40 mg total) by mouth in the morning and at bedtime.    ONE TOUCH ULTRA TEST test strip USE TO CHECK BLOOD SUGAR LEVELS THREE TIMES DAILY. DX:E11.9    sucralfate (CARAFATE) 1 g tablet Take 1 tablet (1 g total) by mouth 2 (two) times daily.    traMADol (ULTRAM) 50 MG tablet Take 50-100 mg by mouth 4 (four) times daily as needed.    vitamin E 400 UNIT  capsule Take 400 Units by mouth daily.    Zinc 50 MG TABS Take 50 mg by mouth daily.    No facility-administered encounter medications on file as of 11/20/2020.    Current Diagnosis: Patient Active Problem List   Diagnosis Date Noted   COVID-19 10/16/2020   Stress at work 04/26/2018   Insomnia due to stress 07/29/2016   Diabetes mellitus associated with hormonal etiology ([place]) 08/01/2015   Hyperlipemia 08/01/2015    Goals Addressed   None     BP Readings from Last 3 Encounters:  10/16/20 137/63  08/31/20 98/60  01/19/20 105/67    Lab Results  Component Value Date   HGBA1C 6.9 08/31/2020    Reviewed chart for medication changes ahead of medication coordination call.  No OVs, Consults, or hospital visits since last care coordination call/Pharmacist visit. (If appropriate, list visit date, provider name)  No medication changes indicated OR if recent visit, treatment plan here.  Patient obtains medications through Adherence Packaging    90 Days   Patient is due for next adherence delivery on: 11-21-2020 Called patient and reviewed medications and coordinated delivery.  This delivery to include:  Omeprazole 17m: Take one capsule by mouth at breakfast and at bedtime.    Metformin 5070m Take two tablets by mouth at breakfast and at bedtime.  Atorvastatin 77m: Take one tablet by mouth before breakfast.  Confirmed delivery date of 11-21-2020, advised patient that pharmacy will contact them the morning of delivery.  ACloretta Ned LPN Clinical Pharmacist Assistant  [phone]   Follow-Up: Medication delivery scheduled for 11-21-2020.   I have reviewed the care management and care coordination activities outlined in this encounter and I am certifying that I agree with the content of this note.  JJunita Push HKenton KingfisherPharmD, BSalyersvilleMChina Lake Surgery Center [phone]

## 2020-11-21 ENCOUNTER — Ambulatory Visit (INDEPENDENT_AMBULATORY_CARE_PROVIDER_SITE_OTHER): Payer: HMO | Admitting: Family Medicine

## 2020-11-21 ENCOUNTER — Encounter: Payer: Self-pay | Admitting: Family Medicine

## 2020-11-21 ENCOUNTER — Ambulatory Visit: Payer: PPO

## 2020-11-21 ENCOUNTER — Other Ambulatory Visit: Payer: Self-pay

## 2020-11-21 VITALS — BP 135/77 | HR 80 | Wt 164.1 lb

## 2020-11-21 DIAGNOSIS — Z23 Encounter for immunization: Secondary | ICD-10-CM | POA: Diagnosis not present

## 2020-11-21 DIAGNOSIS — U071 COVID-19: Secondary | ICD-10-CM | POA: Diagnosis not present

## 2020-11-21 NOTE — Patient Instructions (Signed)
Your lungs sound great! Check with the pharmacy about your Jardiance.

## 2020-11-21 NOTE — Progress Notes (Signed)
   SUBJECTIVE:   CHIEF COMPLAINT / HPI:   Patient Active Problem List   Diagnosis Date Noted  . COVID-19 10/16/2020  . Stress at work 04/26/2018  . Insomnia due to stress 07/29/2016  . Diabetes mellitus associated with hormonal etiology ([facility]) 08/01/2015  . Hyperlipemia 08/01/2015   HOSPITAL FOLLOW UP Time since discharge: 2 weeks Hospital/facility: [facility] Diagnosis: COVID-19 PNA with hypoxia - received remdesivir, dexamethasone, baricitnib - symptom onset 12/27 - unvaccinated Procedures/tests:  - CXR w/ multifocal PNA Consultants: none New medications: none  Discharge instructions:  F/u with PCP Status: better  - some chest tightness with walking with some SOB. No pain. - retired   OBJECTIVE:   BP 135/77   Pulse 80   Wt 164 lb 2 oz (74.4 kg)   SpO2 94%   BMI 22.89 kg/m   Gen: well appearing, in NAD Card: RRR Lungs: CTAB, no wheeze/rales/rhonchi. Comfortable WOB on RA Abd: soft, nonTTP, +BS Ext: WWP, no edema  ASSESSMENT/PLAN:   COVID-19 Doing well s/p hospitalization. Lungs clear on exam today and able to perform ADLs without much difficulty. Continue with conservative care. Continue to recommend vaccination however patient declines.    Caro Laroche, DO

## 2020-11-21 NOTE — Assessment & Plan Note (Signed)
Doing well s/p hospitalization. Lungs clear on exam today and able to perform ADLs without much difficulty. Continue with conservative care. Continue to recommend vaccination however patient declines.

## 2020-11-21 NOTE — Addendum Note (Signed)
Addended by: Verdon Cummins L on: 11/21/2020 12:18 PM   Modules accepted: Orders

## 2020-11-22 ENCOUNTER — Telehealth: Payer: Self-pay | Admitting: Pharmacist

## 2020-11-23 ENCOUNTER — Ambulatory Visit: Payer: Self-pay | Admitting: Pharmacist

## 2020-11-23 DIAGNOSIS — E7800 Pure hypercholesterolemia, unspecified: Secondary | ICD-10-CM

## 2020-11-23 DIAGNOSIS — E1169 Type 2 diabetes mellitus with other specified complication: Secondary | ICD-10-CM | POA: Diagnosis not present

## 2020-11-23 NOTE — Progress Notes (Signed)
Chronic Care Management Pharmacy  Name: [name]  MRN: [id number] DOB: 1955/12/01   Chief Complaint/ HPI  [name],  65 y.o. , male presents for his Follow-Up CCM visit with the clinical pharmacist via telephone.  PCP : Valerie Roys, DO Patient Care Team: Valerie Roys, DO as PCP - General (Family Medicine) Vladimir Faster, [facility] as Pharmacist (Pharmacist)  Patient's chronic conditions include: Hyperlipidemia, Diabetes, GERD and Insomnia   Office Visits: 11/21/20- Dr. Ky Barban- hospital follow-up, Pneumococcal 13 given, [facility] x 2 weeks COVID PNA hypoxia,  08/31/20- Dr. Wynetta Emery- bloodwork, sleep study referral, flu vaccine, psa  Consult Visit: 09/03/20- Dr. Rogers Blocker, Ortho   Subjective: " I'm doing much better"  Objective: No Known Allergies  Medications: Outpatient Encounter Medications as of 11/23/2020  Medication Sig Note  . albuterol (VENTOLIN HFA) 108 (90 Base) MCG/ACT inhaler Inhale 2 puffs into the lungs every 6 (six) hours as needed for wheezing or shortness of breath.   . Ascorbic Acid (VITAMIN C) 1000 MG tablet Take 1,000 mg by mouth daily.   [name] aspirin 81 MG chewable tablet Chew 81 mg by mouth every evening.   [name] atorvastatin (LIPITOR) 40 MG tablet Take 1 tablet (40 mg total) by mouth daily.   . Cholecalciferol (VITAMIN D-3 PO) Take 5,000 Units/day by mouth daily.    . clonazePAM (KLONOPIN) 1 MG tablet Take 1 tablet (1 mg total) by mouth daily as needed for anxiety.   . Dulaglutide (TRULICITY) 4.58 KD/9.8PJ SOPN Inject once a week   . empagliflozin (JARDIANCE) 25 MG TABS tablet Take 1 tablet (25 mg total) by mouth daily.   . Melatonin 10 MG TABS Take 1 tablet by mouth. 01/03/2020: PRN  . metFORMIN (GLUCOPHAGE) 500 MG tablet Take 2 tablets (1,000 mg total) by mouth 2 (two) times daily.   . Multiple Vitamins-Minerals (MULTIVITAMIN ADULT PO) Take by mouth.   . Multiple Vitamins-Minerals (VISION PLUS PO) Take by mouth.   [name] omeprazole (PRILOSEC) 40 MG  capsule Take 1 capsule (40 mg total) by mouth in the morning and at bedtime.   . sucralfate (CARAFATE) 1 g tablet Take 1 tablet (1 g total) by mouth 2 (two) times daily.   . traMADol (ULTRAM) 50 MG tablet Take 50-100 mg by mouth 4 (four) times daily as needed.   . vitamin E 400 UNIT capsule Take 400 Units by mouth daily.   . Zinc 50 MG TABS Take 50 mg by mouth daily.   . benzonatate (TESSALON) 200 MG capsule Take 1 capsule (200 mg total) by mouth 2 (two) times daily as needed for cough. (Patient not taking: Reported on 11/23/2020)   . Blood Glucose Monitoring Suppl (ACCU-CHEK AVIVA PLUS) w/Device KIT Use kit to check sugar daily (Patient not taking: Reported on 11/23/2020)   . ONE TOUCH ULTRA TEST test strip USE TO CHECK BLOOD SUGAR LEVELS THREE TIMES DAILY. DX:E11.9 (Patient not taking: Reported on 11/23/2020)    No facility-administered encounter medications on file as of 11/23/2020.    Wt Readings from Last 3 Encounters:  11/21/20 164 lb 2 oz (74.4 kg)  11/19/20 162 lb (73.5 kg)  10/16/20 164 lb (74.4 kg)    Lab Results  Component Value Date   CREATININE 0.89 08/31/2020   BUN 21 08/31/2020   GFRNONAA 89 08/31/2020   GFRAA 103 08/31/2020   NA 138 08/31/2020   K 4.2 08/31/2020   CALCIUM 9.7 08/31/2020   CO2 26 08/31/2020     Current Diagnosis/Assessment:  Goals Addressed              This Visit's Progress   .  "We want to make sure his medications are OK" (pt-stated)        [place] (see longtitudinal plan of care for additional care plan information)  Current Barriers:  . Diabetes:  . controlled; most recent A1c 6.5% . Current antihyperglycemic regimen: Jardiance 25 mg daily, Trulicity 6.30 mg once weekly, metformin 1000 mg qam 500 mg qpm o New part D plan covers 100% of Jardiance/Trulicity copays. o Patient follows with endocrinology. PAP renewal applications already sent to endo for completion. o Plans to request CGM at next visit with Endo. . Current glucose  readings:  o Fasting: Mostly <140, occasionally 150s o Afternoons: 144- 165, 200-279 when he had dessert Hyperlipidemia:  . Cardiovascular risk reduction: o Current hypertensive regimen: n/a; BP well controlled at <130/80 at office visits; UACR <30 on last check  o Current hyperlipidemia regimen: atorvastatin 40 mg daily; LDL not quite at goal <70 ASCVD risk 23.5% o  Pharmacist Clinical Goal(s):  [name] Over the next 90 days, patient with work with PharmD and primary care provider to address optimized self-management of diabetes  Interventions: . Comprehensive medication review performed, medication list updated in electronic medical record . Encouraged continued glucose checks and medication adherence.  . Reviewed goal glucose readings for an A1c of <7%, we want to see fasting sugars <130 and 2 hour after meal sugars <180.    Patient Self Care Activities:  . Patient will check blood glucose daily , document, and provide at future appointments . Patient will take medications as prescribed . Patient will contact provider with any episodes of hypoglycemia . Patient will report any questions or concerns to provider   Please see past updates related to this goal by clicking on the "Past Updates" button in the selected goal           Diabetes   A1c goal <7%  Recent Relevant Labs: Labs (Brief)       Lab Results  Component Value Date/Time   HGBA1C 6.5 (H) 01/19/2020 11:58 AM   HGBA1C 6.7 (H) 07/25/2019 02:09 PM   HGBA1C 6.4 09/06/2018 04:03 PM   HGBA1C 6.7 04/26/2018 04:54 PM   MICROALBUR 10 04/26/2018 02:53 PM   MICROALBUR 10 11/20/2016 08:51 AM      Last diabetic Eye exam:  Labs (Brief)       Lab Results  Component Value Date/Time   HMDIABEYEEXA No Retinopathy 03/23/2019 12:00 AM      Last diabetic Foot exam:  Labs (Brief)  No results found for: HMDIABFOOTEX   BMP Latest Ref Rng & Units 08/31/2020 01/19/2020 07/25/2019  Glucose 65 - 99 mg/dL 107(H) 110(H) 82   BUN 8 - 27 mg/dL '21 20 20  ' Creatinine 0.76 - 1.27 mg/dL 0.89 1.10 1.05  BUN/Creat Ratio 10 - '24 24 18 19  ' Sodium 134 - 144 mmol/L 138 140 139  Potassium 3.5 - 5.2 mmol/L 4.2 4.2 4.0  Chloride 96 - 106 mmol/L 101 100 101  CO2 20 - 29 mmol/L '26 24 23  ' Calcium 8.6 - 10.2 mg/dL 9.7 9.3 9.2      Checking BG: Four times weekly  Recent FBG Readings: 124, 128, 132, 156 this morning Recent postprandials BG readings: 200, 279 when he had dessert  Patient has failed these meds in past: NA Patient is currently controlled on the following medications:  Jardiance 25 mg daily  Trulicity 4.49 mg weekly  Metformin 1000 mg qam 500 mg qpm  We discussed: Patient and spouse have a new part D plan which covers diabetic medications with zero copay. His PAP renewals were previously sent to Endo office for signature and submission.  He follows with [name] at  Endocrinology.He plans to discuss a CGM monitor at upcoming visit and has been using Ellen's meter in the interim. He denies any symptoms or hypoglycemia or low readings. He is slowly becoming more active after bout with Covid and walked with his grandson and his [place] this week.He walked to the mailbox yesterday and inside the house.    He eats mostly a low-carb diet and avoids sugary drinks most of the time. He reports taking an additional 1/4-1/2 tablet of metformin if he indulges in dessert. He states this works well for him and his sugar does not go above 200. He reports self- titrating his metformin to 500 mg every evening due to lows overnight.  Plan Continue current regimen.  Hyperlipidemia   LDL goal < 70  Last lipids Lab Results  Component Value Date   CHOL 169 08/31/2020   HDL 49 08/31/2020   LDLCALC 104 (H) 08/31/2020   TRIG 84 08/31/2020   CHOLHDL 3.7 01/06/2018   Hepatic Function Latest Ref Rng & Units 08/31/2020 01/19/2020 07/25/2019  Total Protein 6.0 - 8.5 g/dL 7.0 7.0 6.6  Albumin 3.8 - 4.8 g/dL 4.7  4.5 4.3  AST 0 - 40 IU/L '15 14 15  ' ALT 0 - 44 IU/L '18 17 17  ' Alk Phosphatase 44 - 121 IU/L 77 76 82  Total Bilirubin 0.0 - 1.2 mg/dL 0.5 0.3 0.4     The 10-year ASCVD risk score Mikey Bussing DC Jr., et al., 2013) is: 19.9%   Values used to calculate the score:     Age: 44 years     Sex: Male     Is Non-Hispanic African American: No     Diabetic: Yes     Tobacco smoker: No     Systolic Blood Pressure: 675 mmHg     Is BP treated: No     HDL Cholesterol: 49 mg/dL     Total Cholesterol: 169 mg/dL   Patient has failed these meds in past: NA Patient is currently uncontrolled on the following medications:  . Aspirin 81 ec mg qd . Atorvastatin 40 mg qd  We discussed:  Diet and exercise. Could consider more stringent goal of ld l< 70 and change to rosuvastatin 34m qd  Plan  Continue current medications and control with diet and exercise. Consider changing to 40 mg rosuvastatin for ldl reduction.  GERD?   Patient has failed these meds in past: NA Patient is currently controlled on the following medications:  . Omeprazole 40 mg bid  . Sucralfate 1-2 tabs qd prn  We discussed:  Patient reports symptoms well controlled. Only intermittent breakthrough with dietary indescretion. He follows with GI.  Plan  Continue current medications and control with diet and exercise. Recommends adding GERD diagnosis to patient problem list.     Vaccines   Reviewed and discussed patient's vaccination history.    Immunization History  Administered Date(s) Administered  . Fluad Quad(high Dose 65+) 09/07/2019, 08/31/2020  . Influenza,inj,Quad PF,6+ Mos 07/29/2016, 06/29/2017  . Influenza-Unspecified 07/20/2015, 07/29/2016, 06/29/2017, 07/20/2018  . Pneumococcal Conjugate-13 11/21/2020  . Pneumococcal-Unspecified 01/19/1999, 04/17/2005  . Td 01/22/2009  . Zoster 11/20/2016    Plan  Recommended patient receive COVID and Shingrix  vaccines.    Medication Management   Patient's preferred  pharmacy is:  [id number] [place] [address]: [phone]: [phone] [facility] [place] [address] [place]: [phone]: [phone] Uses pill box? No - compliance packaging Pt endorses 95% compliance  We discussed: Patient uses [facility] med sync, packaging and delivery.  Plan  Continue current medication management strategy    Follow up: 3 month phone visit  JJunita Push HKenton KingfisherPharmD, [facility] [phone]

## 2020-11-25 NOTE — Progress Notes (Addendum)
Chronic Care Management Pharmacy Assistant   Name: BENSEN CHADDERDON  MRN: [id number] DOB: 03/22/1956  Reason for Encounter: Incoming Call Related to Co-Pay for [place]   PCP : System, Provider Not In  Allergies:  No Known Allergies  Medications: Outpatient Encounter Medications as of 11/22/2020  Medication Sig Note   Ascorbic Acid (VITAMIN C) 1000 MG tablet Take 1,000 mg by mouth daily.    aspirin 81 MG chewable tablet Chew 81 mg by mouth every evening.    atorvastatin (LIPITOR) 40 MG tablet Take 1 tablet (40 mg total) by mouth daily.    benzonatate (TESSALON) 200 MG capsule Take 1 capsule (200 mg total) by mouth 2 (two) times daily as needed for cough. (Patient not taking: Reported on 11/23/2020)    Blood Glucose Monitoring Suppl (ACCU-CHEK AVIVA PLUS) w/Device KIT Use kit to check sugar daily (Patient not taking: Reported on 11/23/2020)    Cholecalciferol (VITAMIN D-3 PO) Take 5,000 Units/day by mouth daily.     clonazePAM (KLONOPIN) 1 MG tablet Take 1 tablet (1 mg total) by mouth daily as needed for anxiety.    Dulaglutide (TRULICITY) 9.79 GX/2.1JH SOPN Inject once a week    empagliflozin (JARDIANCE) 25 MG TABS tablet Take 1 tablet (25 mg total) by mouth daily.    Melatonin 10 MG TABS Take 1 tablet by mouth. 01/03/2020: PRN   metFORMIN (GLUCOPHAGE) 500 MG tablet Take 2 tablets (1,000 mg total) by mouth 2 (two) times daily.    Multiple Vitamins-Minerals (MULTIVITAMIN ADULT PO) Take by mouth.    Multiple Vitamins-Minerals (VISION PLUS PO) Take by mouth.    omeprazole (PRILOSEC) 40 MG capsule Take 1 capsule (40 mg total) by mouth in the morning and at bedtime.    ONE TOUCH ULTRA TEST test strip USE TO CHECK BLOOD SUGAR LEVELS THREE TIMES DAILY. DX:E11.9 (Patient not taking: Reported on 11/23/2020)    sucralfate (CARAFATE) 1 g tablet Take 1 tablet (1 g total) by mouth 2 (two) times daily.    traMADol (ULTRAM) 50 MG tablet Take 50-100 mg by mouth 4 (four) times daily as needed.    vitamin  E 400 UNIT capsule Take 400 Units by mouth daily.    Zinc 50 MG TABS Take 50 mg by mouth daily.    No facility-administered encounter medications on file as of 11/22/2020.    Current Diagnosis: Patient Active Problem List   Diagnosis Date Noted   COVID-19 10/16/2020   Stress at work 04/26/2018   Insomnia due to stress 07/29/2016   Diabetes mellitus associated with hormonal etiology ([place]) 08/01/2015   Hyperlipemia 08/01/2015    11-22-2020:The patient's wife called to report the patient was "almost" out of his medication Jardiance. She was calling to check the status of his patient assistance application. I explained to Mrs. Heal that we prefilled his Jardiance application and faxed it to [facility] for MD signature on 11-06-2020. I explained to Mrs. Joneen Caraway I would follow up with [facility]. [facility] and spoke to [place] who explained they did receive the patient assistance application for Jardiance on 11-06-2020. She explained the application was in a folder on the provider's desk waiting for a signature. I stressed to Anderson Malta the patient was almost out of medication. She verbalized understanding and stated she would send an urgent staff message to the provider's nurse.   I followed back up with Mrs. Canela and explained the above. She verbalized understanding. She also explained that Mr. Mignano enrolled in a  new RX plan that would cover all his diabetic medications. She confirmed they use [facility] for their medications. Explained to Mrs. Abercrombie I would reach out to [facility] to see if his new insurance would cover the cost of his Jardiance. Also explained I would reach out back to her the following day after I spoke with [facility]. [place] and explained the situation above. They attempted to run the patient's new insurance card but explained their system was down. Explained I would reach back out first thing the next day.   11-23-2020:  Reached back out to [place] who explained the patient's new RX plan covers the entire cost of his Jardiance. Requested delivery of the patient's Jardiance for 11-24-2020. Called Mrs. Hritz back and explained his new RX plan covered the entire cost of Jardiance and I scheduled a next-day delivery. She verbalized understanding of information and was very grateful for [place] "handling this medication situation".  Cloretta Ned, LPN Clinical Pharmacist Assistant  [phone]    Follow-Up:  Pharmacist Review  I have reviewed the care management and care coordination activities outlined in this encounter and I am certifying that I agree with the content of this note.  Junita Push. Kenton Kingfisher PharmD, [facility] [phone]

## 2020-11-26 DIAGNOSIS — E1169 Type 2 diabetes mellitus with other specified complication: Secondary | ICD-10-CM | POA: Diagnosis not present

## 2020-11-26 DIAGNOSIS — E785 Hyperlipidemia, unspecified: Secondary | ICD-10-CM | POA: Diagnosis not present

## 2020-11-26 DIAGNOSIS — E119 Type 2 diabetes mellitus without complications: Secondary | ICD-10-CM | POA: Diagnosis not present

## 2020-11-26 NOTE — Patient Instructions (Addendum)
Visit Information  It was a pleasure speaking with you today. Thank you for letting me be part of your clinical team. Please call with any questions or concerns.   Goals Addressed              This Visit's Progress   .  "We want to make sure his medications are OK" (pt-stated)        CARE PLAN ENTRY (see longtitudinal plan of care for additional care plan information)  Current Barriers:  . Diabetes:  . controlled; most recent A1c 6.5% . Current antihyperglycemic regimen: Jardiance 25 mg daily, Trulicity 0.75 mg once weekly, metformin 1000 mg qam 500 mg qpm o New part D plan covers 100% of Jardiance/Trulicity copays. o Patient follows with endocrinology. PAP renewal applications already sent to endo for completion. o Plans to request CGM at next visit with Endo. . Current glucose readings:  o Fasting: Mostly <140, occasionally 150s o Afternoons: 144- 165, 200-279 when he had dessert Hyperlipidemia:  . Cardiovascular risk reduction: o Current hypertensive regimen: n/a; BP well controlled at <130/80 at office visits; UACR <30 on last check  o Current hyperlipidemia regimen: atorvastatin 40 mg daily; LDL not quite at goal <70 ASCVD risk 23.5% o  Pharmacist Clinical Goal(s):  [name] Over the next 90 days, patient with work with PharmD and primary care provider to address optimized self-management of diabetes  Interventions: . Comprehensive medication review performed, medication list updated in electronic medical record . Encouraged continued glucose checks and medication adherence.  . Reviewed goal glucose readings for an A1c of <7%, we want to see fasting sugars <130 and 2 hour after meal sugars <180.    Patient Self Care Activities:  . Patient will check blood glucose daily , document, and provide at future appointments . Patient will take medications as prescribed . Patient will contact provider with any episodes of hypoglycemia . Patient will report any questions or concerns to  provider   Please see past updates related to this goal by clicking on the "Past Updates" button in the selected goal          The patient verbalized understanding of instructions, educational materials, and care plan provided today and agreed to receive a mailed copy of patient instructions, educational materials, and care plan.   Telephone follow up appointment with pharmacy team member scheduled for: 3 months  [place] Pod. [name] PharmD, BCPS Clinical Pharmacist [phone]  Dyslipidemia Dyslipidemia is an imbalance of waxy, fat-like substances (lipids) in the blood. The body needs lipids in small amounts. Dyslipidemia often involves a high level of cholesterol or triglycerides, which are types of lipids. Common forms of dyslipidemia include:  High levels of LDL cholesterol. LDL is the type of cholesterol that causes fatty deposits (plaques) to build up in the blood vessels that carry blood away from your heart (arteries).  Low levels of HDL cholesterol. HDL cholesterol is the type of cholesterol that protects against heart disease. High levels of HDL remove the LDL buildup from arteries.  High levels of triglycerides. Triglycerides are a fatty substance in the blood that is linked to a buildup of plaques in the arteries. What are the causes? Primary dyslipidemia is caused by changes (mutations) in genes that are passed down through families (inherited). These mutations cause several types of dyslipidemia. Secondary dyslipidemia is caused by lifestyle choices and diseases that lead to dyslipidemia, such as:  Eating a diet that is high in animal fat.  Not getting enough exercise.  Having diabetes, kidney disease, liver disease, or thyroid disease.  Drinking large amounts of alcohol.  Using certain medicines. What increases the risk? You are more likely to develop this condition if you are an older man or if you are a woman who has gone through menopause. Other risk factors  include:  Having a family history of dyslipidemia.  Taking certain medicines, including birth control pills, steroids, some diuretics, and beta-blockers.  Smoking cigarettes.  Eating a high-fat diet.  Having certain medical conditions such as diabetes, polycystic ovary syndrome (PCOS), kidney disease, liver disease, or hypothyroidism.  Not exercising regularly.  Being overweight or obese with too much belly fat. What are the signs or symptoms? In most cases, dyslipidemia does not usually cause any symptoms. In severe cases, very high lipid levels can cause:  Fatty bumps under the skin (xanthomas).  White or gray ring around the black center (pupil) of the eye. Very high triglyceride levels can cause inflammation of the pancreas (pancreatitis). How is this diagnosed? Your health care provider may diagnose dyslipidemia based on a routine blood test (fasting blood test). Because most people do not have symptoms of the condition, this blood testing (lipid profile) is done on adults age 50 and older and is repeated every 5 years. This test checks:  Total cholesterol. This measures the total amount of cholesterol in your blood, including LDL cholesterol, HDL cholesterol, and triglycerides. A healthy number is below 200.  LDL cholesterol. The target number for LDL cholesterol is different for each person, depending on individual risk factors. Ask your health care provider what your LDL cholesterol should be.  HDL cholesterol. An HDL level of 60 or higher is best because it helps to protect against heart disease. A number below 40 for men or below 50 for women increases the risk for heart disease.  Triglycerides. A healthy triglyceride number is below 150. If your lipid profile is abnormal, your health care provider may do other blood tests.   How is this treated? Treatment depends on the type of dyslipidemia that you have and your other risk factors for heart disease and stroke. Your  health care provider will have a target range for your lipid levels based on this information. For many people, this condition may be treated by lifestyle changes, such as diet and exercise. Your health care provider may recommend that you:  Get regular exercise.  Make changes to your diet.  Quit smoking if you smoke. If diet changes and exercise do not help you reach your goals, your health care provider may also prescribe medicine to lower lipids. The most commonly prescribed type of medicine lowers your LDL cholesterol (statin drug). If you have a high triglyceride level, your provider may prescribe another type of drug (fibrate) or an omega-3 fish oil supplement, or both. Follow these instructions at home: Eating and drinking  Follow instructions from your health care provider or dietitian about eating or drinking restrictions.  Eat a healthy diet as told by your health care provider. This can help you reach and maintain a healthy weight, lower your LDL cholesterol, and raise your HDL cholesterol. This may include: ? Limiting your calories, if you are overweight. ? Eating more fruits, vegetables, whole grains, fish, and lean meats. ? Limiting saturated fat, trans fat, and cholesterol.  If you drink alcohol: ? Limit how much you use. ? Be aware of how much alcohol is in your drink. In the [place] one drink equals one 12 oz bottle of beer (  355 mL), one 5 oz glass of wine (148 mL), or one 1 oz glass of hard liquor (44 mL).  Do not drink alcohol if: ? Your health care provider tells you not to drink. ? You are pregnant, may be pregnant, or are planning to become pregnant. Activity  Get regular exercise. Start an exercise and strength training program as told by your health care provider. Ask your health care provider what activities are safe for you. Your health care provider may recommend: ? 30 minutes of aerobic activity 4-6 days a week. Brisk walking is an example of aerobic  activity. ? Strength training 2 days a week. General instructions  Do not use any products that contain nicotine or tobacco, such as cigarettes, e-cigarettes, and chewing tobacco. If you need help quitting, ask your health care provider.  Take over-the-counter and prescription medicines only as told by your health care provider. This includes supplements.  Keep all follow-up visits as told by your health care provider.   Contact a health care provider if:  You are: ? Having trouble sticking to your exercise or diet plan. ? Struggling to quit smoking or control your use of alcohol. Summary  Dyslipidemia often involves a high level of cholesterol or triglycerides, which are types of lipids.  Treatment depends on the type of dyslipidemia that you have and your other risk factors for heart disease and stroke.  For many people, treatment starts with lifestyle changes, such as diet and exercise.  Your health care provider may prescribe medicine to lower lipids. This information is not intended to replace advice given to you by your health care provider. Make sure you discuss any questions you have with your health care provider. Document Revised: 05/31/2018 Document Reviewed: 05/07/2018 Elsevier Patient Education  2021 [employer].

## 2020-11-27 ENCOUNTER — Telehealth: Payer: Self-pay

## 2020-11-27 DIAGNOSIS — J1282 Pneumonia due to coronavirus disease 2019: Secondary | ICD-10-CM

## 2020-11-27 NOTE — Telephone Encounter (Signed)
Pt presented in office needing a referral to Dr Meredeth Ide at [facility] for more testing since having covid pneumonia. Please advise

## 2020-11-28 ENCOUNTER — Other Ambulatory Visit: Payer: Self-pay | Admitting: Family Medicine

## 2020-11-28 NOTE — Telephone Encounter (Signed)
Any recommendation for the cough

## 2020-11-28 NOTE — Telephone Encounter (Signed)
He can try any OTC cough medication, lozenges, honey can also be very soothing for the throat and help suppress cough. I see he has previously taken tessalon, we can send in a refill for this if he likes.

## 2020-11-28 NOTE — Telephone Encounter (Signed)
Patient's wife called again about getting her husband into Dr. Meredeth Ide at [facility]. She says he is getting sicker and she don't want him to have a backset. She said he is coughing bad and coughing up clear mucus. She can be reached at [phone]. Please advise

## 2020-11-28 NOTE — Telephone Encounter (Signed)
Patient notified. States they still have some tessalon still at home.

## 2020-11-28 NOTE — Telephone Encounter (Signed)
Pt called in last seen by Dr Linwood Dibbles  2/2 wants to know if referral can be placed by her. Also pt's chest is hurting due to coughing and she doesn't know what to due. Please advise.

## 2020-11-28 NOTE — Telephone Encounter (Signed)
Referral placed 2/8 by Dr. Laural Benes.

## 2020-11-29 ENCOUNTER — Other Ambulatory Visit
Admission: RE | Admit: 2020-11-29 | Discharge: 2020-11-29 | Disposition: A | Discharge status: Home / Self Care | Payer: HMO | Source: Ambulatory Visit | Attending: Specialist | Admitting: Specialist

## 2020-11-29 ENCOUNTER — Telehealth: Payer: Self-pay

## 2020-11-29 DIAGNOSIS — R0602 Shortness of breath: Secondary | ICD-10-CM | POA: Insufficient documentation

## 2020-11-29 DIAGNOSIS — R059 Cough, unspecified: Secondary | ICD-10-CM | POA: Diagnosis not present

## 2020-11-29 DIAGNOSIS — J208 Acute bronchitis due to other specified organisms: Secondary | ICD-10-CM | POA: Diagnosis not present

## 2020-11-29 DIAGNOSIS — J209 Acute bronchitis, unspecified: Secondary | ICD-10-CM | POA: Diagnosis not present

## 2020-11-29 DIAGNOSIS — U071 COVID-19: Secondary | ICD-10-CM | POA: Diagnosis not present

## 2020-11-29 DIAGNOSIS — R0902 Hypoxemia: Secondary | ICD-10-CM | POA: Diagnosis not present

## 2020-11-29 LAB — FIBRIN DERIVATIVES D-DIMER (ARMC ONLY): Fibrin derivatives D-dimer (ARMC): 625.44 ng/mL (FEU) — ABNORMAL HIGH (ref 0.00–499.00)

## 2020-11-29 NOTE — Telephone Encounter (Signed)
I had sent this via proficient. I did call and the front office let me know they do not have anyone working proficient. I did go ahead and send it manually. They should be calling the patient soon.

## 2020-11-29 NOTE — Telephone Encounter (Signed)
Copied from CRM [id number]. Topic: Referral - Status >> Nov 29, 2020  9:26 AM Marylen Ponto wrote: Reason for CRM: Pt wife stated she just got off the phone with the pulmonary office and they have yet to receive the referral. Pt wife requests that the referral is faxed to [phone] attn: Jae Dire asap because the office is trying to work the pt in today.

## 2020-11-30 ENCOUNTER — Emergency Department: Payer: HMO

## 2020-11-30 ENCOUNTER — Emergency Department
Admission: EM | Admit: 2020-11-30 | Discharge: 2020-11-30 | Disposition: A | Discharge status: Home / Self Care | Payer: HMO | Attending: Emergency Medicine | Admitting: Emergency Medicine

## 2020-11-30 ENCOUNTER — Other Ambulatory Visit: Payer: Self-pay

## 2020-11-30 DIAGNOSIS — Z7984 Long term (current) use of oral hypoglycemic drugs: Secondary | ICD-10-CM | POA: Insufficient documentation

## 2020-11-30 DIAGNOSIS — R079 Chest pain, unspecified: Secondary | ICD-10-CM | POA: Diagnosis not present

## 2020-11-30 DIAGNOSIS — U071 COVID-19: Secondary | ICD-10-CM | POA: Diagnosis not present

## 2020-11-30 DIAGNOSIS — J1282 Pneumonia due to coronavirus disease 2019: Secondary | ICD-10-CM | POA: Diagnosis not present

## 2020-11-30 DIAGNOSIS — Z87891 Personal history of nicotine dependence: Secondary | ICD-10-CM | POA: Insufficient documentation

## 2020-11-30 DIAGNOSIS — R0602 Shortness of breath: Secondary | ICD-10-CM

## 2020-11-30 DIAGNOSIS — Z7982 Long term (current) use of aspirin: Secondary | ICD-10-CM | POA: Insufficient documentation

## 2020-11-30 DIAGNOSIS — Z8616 Personal history of COVID-19: Secondary | ICD-10-CM | POA: Insufficient documentation

## 2020-11-30 DIAGNOSIS — J189 Pneumonia, unspecified organism: Secondary | ICD-10-CM | POA: Insufficient documentation

## 2020-11-30 DIAGNOSIS — E119 Type 2 diabetes mellitus without complications: Secondary | ICD-10-CM | POA: Insufficient documentation

## 2020-11-30 LAB — BASIC METABOLIC PANEL
Anion gap: 11 (ref 5–15)
BUN: 19 mg/dL (ref 8–23)
CO2: 21 mmol/L — ABNORMAL LOW (ref 22–32)
Calcium: 9.2 mg/dL (ref 8.9–10.3)
Chloride: 102 mmol/L (ref 98–111)
Creatinine, Ser: 0.92 mg/dL (ref 0.61–1.24)
GFR, Estimated: >60 mL/min (ref >60)
Glucose, Bld: 276 mg/dL — ABNORMAL HIGH (ref 70–99)
Potassium: 4.4 mmol/L (ref 3.5–5.1)
Sodium: 134 mmol/L — ABNORMAL LOW (ref 135–145)

## 2020-11-30 LAB — CBC
HCT: 42 % (ref 39.0–52.0)
Hemoglobin: 14 g/dL (ref 13.0–17.0)
MCH: 30 pg (ref 26.0–34.0)
MCHC: 33.3 g/dL (ref 30.0–36.0)
MCV: 89.9 fL (ref 80.0–100.0)
Platelets: 172 10*3/uL (ref 150–400)
RBC: 4.67 MIL/uL (ref 4.22–5.81)
RDW: 13.2 % (ref 11.5–15.5)
WBC: 5.4 10*3/uL (ref 4.0–10.5)
nRBC: 0 % (ref 0.0–0.2)

## 2020-11-30 LAB — TROPONIN I (HIGH SENSITIVITY)
Troponin I (High Sensitivity): 3 ng/L (ref <18)
Troponin I (High Sensitivity): 2 ng/L (ref <18)

## 2020-11-30 MED ORDER — DOXYCYCLINE HYCLATE 100 MG PO CAPS: 100.0000 mg | ORAL_CAPSULE | Freq: Two times a day (BID) | ORAL | 0 refills | Status: AC

## 2020-11-30 MED ORDER — LEVOFLOXACIN 750 MG PO TABS: 750.0000 mg | ORAL_TABLET | Freq: Once | ORAL | Status: DC

## 2020-11-30 MED ORDER — DOXYCYCLINE HYCLATE 100 MG PO TABS
100.0000 mg | ORAL_TABLET | Freq: Once | ORAL | Status: AC
  Administered 2020-11-30: 100 mg via ORAL
  Filled 2020-11-30: qty 1

## 2020-11-30 MED ORDER — IOHEXOL 350 MG/ML SOLN
75.0000 mL | Freq: Once | INTRAVENOUS | Status: AC | PRN
  Administered 2020-11-30: 75 mL via INTRAVENOUS

## 2020-11-30 NOTE — Discharge Instructions (Addendum)
As we discussed, it's reasonable to start an antibiotic for possible early pneumonia  Continue your medications per Dr. Meredeth Ide  Drink plenty of fluid

## 2020-11-30 NOTE — ED Triage Notes (Signed)
Pt to ED POV for chief complaint of elevated d dimer sent from dr to rule out PE, c/o shob PT appears shob with activity  +cp  Discussed pt with Dr Vicente Males

## 2020-11-30 NOTE — ED Provider Notes (Signed)
[facility] Emergency Department Provider Note  ____________________________________________   Event Date/Time   First MD Initiated Contact with Patient 11/30/20 2144     (approximate)  I have reviewed the triage vital signs and the nursing notes.   HISTORY  Chief Complaint Shortness of Breath    HPI [name] is a 65 y.o. male  Here with SOB.  Patient received diagnosed with COVID-19.  He has been slowly improving.  However, he went to see his pulmonologist and states that he has had increased cough and sputum production over the last week.  Is prescribed prednisone.  Based on outpatient labs of which a D-dimer was positive so he was sent here for rule out of PE.  Denies any lower extremity swelling.  Denies any significant increase in shortness of breath.  No chest pain.  No fevers.  No other acute complaints.  No wheezing.   Is general shortness of breath worsens with exertion.  No relieving factors.       Past Medical History:  Diagnosis Date  . Colon polyps   . Diabetes mellitus without complication ([place])   . Irregular heart rhythm   . Kidney stone   . Meniscus tear    left knee  . Pneumothorax   . Pneumothorax   . Rheumatic fever     Patient Active Problem List   Diagnosis Date Noted  . COVID-19 10/16/2020  . Stress at work 04/26/2018  . Insomnia due to stress 07/29/2016  . Diabetes mellitus associated with hormonal etiology ([place]) 08/01/2015  . Hyperlipemia 08/01/2015    Past Surgical History:  Procedure Laterality Date  . APPENDECTOMY    . KNEE ARTHROSCOPY WITH MEDIAL MENISECTOMY Left 09/24/2015   Procedure: KNEE ARTHROSCOPY WITH partial MEDIAL MENISECTOMY, chondroplasty;  Surgeon: Dereck Leep, MD;  Location: [facility] ORS;  Service: Orthopedics;  Laterality: Left;  . LITHOTRIPSY      Prior to Admission medications   Medication Sig Start Date End Date Taking? Authorizing Provider  doxycycline (VIBRAMYCIN) 100 MG capsule Take 1  capsule (100 mg total) by mouth 2 (two) times daily for 7 days. 11/30/20 12/07/20 Yes Duffy Bruce, MD  albuterol (VENTOLIN HFA) 108 (90 Base) MCG/ACT inhaler Inhale 2 puffs into the lungs every 6 (six) hours as needed for wheezing or shortness of breath.    [provider]  Ascorbic Acid (VITAMIN C) 1000 MG tablet Take 1,000 mg by mouth daily.    [provider]  aspirin 81 MG chewable tablet Chew 81 mg by mouth every evening.    [provider]  atorvastatin (LIPITOR) 40 MG tablet Take 1 tablet (40 mg total) by mouth daily. 08/31/20   Johnson, Megan P, DO  benzonatate (TESSALON) 200 MG capsule Take 1 capsule (200 mg total) by mouth 2 (two) times daily as needed for cough. Patient not taking: Reported on 11/23/2020 10/16/20   Myles Gip, DO  Blood Glucose Monitoring Suppl (ACCU-CHEK AVIVA PLUS) w/Device KIT Use kit to check sugar daily Patient not taking: Reported on 11/23/2020 09/22/17   Guadalupe Maple, MD  Cholecalciferol (VITAMIN D-3 PO) Take 5,000 Units/day by mouth daily.     [provider]  clonazePAM (KLONOPIN) 1 MG tablet Take 1 tablet (1 mg total) by mouth daily as needed for anxiety. 08/31/20   Park Liter P, DO  Dulaglutide (TRULICITY) 0.17 PZ/0.2HE SOPN Inject once a week 08/31/20   Park Liter P, DO  empagliflozin (JARDIANCE) 25 MG TABS tablet Take  1 tablet (25 mg total) by mouth daily. 08/31/20   Johnson, Megan P, DO  Melatonin 10 MG TABS Take 1 tablet by mouth.    [provider]  metFORMIN (GLUCOPHAGE) 500 MG tablet Take 2 tablets (1,000 mg total) by mouth 2 (two) times daily. 08/31/20   Johnson, Megan P, DO  Multiple Vitamins-Minerals (MULTIVITAMIN ADULT PO) Take by mouth.    [provider]  Multiple Vitamins-Minerals (VISION PLUS PO) Take by mouth.    [provider]  omeprazole (PRILOSEC) 40 MG capsule Take 1 capsule (40 mg total) by mouth in the morning and at bedtime. 08/31/20   Johnson, Megan P, DO   ONE TOUCH ULTRA TEST test strip USE TO CHECK BLOOD SUGAR LEVELS THREE TIMES DAILY. DX:E11.9 Patient not taking: Reported on 11/23/2020 06/29/17   Guadalupe Maple, MD  sucralfate (CARAFATE) 1 g tablet Take 1 tablet (1 g total) by mouth 2 (two) times daily. 08/31/20   Johnson, Megan P, DO  traMADol (ULTRAM) 50 MG tablet Take 50-100 mg by mouth 4 (four) times daily as needed. 10/09/20   [provider]  vitamin E 400 UNIT capsule Take 400 Units by mouth daily.    [provider]  Zinc 50 MG TABS Take 50 mg by mouth daily.    [provider]    Allergies Patient has no known allergies.  Family History  Problem Relation Age of Onset  . Heart disease Mother   . Thyroid disease Mother   . Diabetes Mother   . Cancer Sister        lung  . Diabetes Brother   . Diabetes Maternal Grandmother     Social History Social History   Tobacco Use  . Smoking status: Former Smoker    Types: Cigarettes    Quit date: 10/20/1988    Years since quitting: 32.1  . Smokeless tobacco: Former Systems developer    Types: [place] date: 1994  Media planner  . Vaping Use: Never used  Substance Use Topics  . Alcohol use: No    Alcohol/week: 0.0 standard drinks  . Drug use: No    Review of Systems  Review of Systems  Constitutional: Positive for fatigue. Negative for chills and fever.  HENT: Negative for sore throat.   Respiratory: Positive for shortness of breath.   Cardiovascular: Negative for chest pain.  Gastrointestinal: Negative for abdominal pain.  Genitourinary: Negative for flank pain.  Musculoskeletal: Negative for neck pain.  Skin: Negative for rash and wound.  Allergic/Immunologic: Negative for immunocompromised state.  Neurological: Positive for weakness. Negative for numbness.  Hematological: Does not bruise/bleed easily.  All other systems reviewed and are negative.    ____________________________________________  PHYSICAL EXAM:      VITAL SIGNS: ED Triage Vitals   Enc Vitals Group     BP 11/30/20 1808 133/83     Pulse Rate 11/30/20 1808 (!) 102     Resp 11/30/20 1808 (!) 22     Temp 11/30/20 1808 98.8 F (37.1 C)     Temp Source 11/30/20 1808 Oral     SpO2 11/30/20 1808 94 %     Weight 11/30/20 1809 163 lb 2.3 oz (74 kg)     Height 11/30/20 1809 _0  (1.803 m)     Head Circumference --      Peak Flow --      Pain Score 11/30/20 1809 5     Pain Loc --  Pain Edu? --      Excl. in [place]? --      Physical Exam Vitals and nursing note reviewed.  Constitutional:      General: He is not in acute distress.    Appearance: He is well-developed.  HENT:     Head: Normocephalic and atraumatic.  Eyes:     Conjunctiva/sclera: Conjunctivae normal.  Cardiovascular:     Rate and Rhythm: Normal rate and regular rhythm.     Heart sounds: Normal heart sounds. No murmur heard. No friction rub.  Pulmonary:     Effort: Pulmonary effort is normal. No respiratory distress.     Breath sounds: Wheezing (Scant, expiratory) present. No rales.  Abdominal:     General: There is no distension.     Palpations: Abdomen is soft.     Tenderness: There is no abdominal tenderness.  Musculoskeletal:     Cervical back: Neck supple.  Skin:    General: Skin is warm.     Capillary Refill: Capillary refill takes less than 2 seconds.  Neurological:     Mental Status: He is alert and oriented to person, place, and time.     Motor: No abnormal muscle tone.       ____________________________________________   LABS (all labs ordered are listed, but only abnormal results are displayed)  Labs Reviewed  BASIC METABOLIC PANEL - Abnormal; Notable for the following components:      Result Value   Sodium 134 (*)    CO2 21 (*)    Glucose, Bld 276 (*)    All other components within normal limits  CBC  TROPONIN I (HIGH SENSITIVITY)  TROPONIN I (HIGH SENSITIVITY)    ____________________________________________  EKG: Normal sinus rhythm, ventricular 96.  PR 160,  QRS 94, QTc 442.  No acute ST elevations or depression but no acute evidence of acute ischemia or infarct. ________________________________________  RADIOLOGY All imaging, including plain films, CT scans, and ultrasounds, independently reviewed by me, and interpretations confirmed via formal radiology reads.  ED MD interpretation:   Chest x-ray: Bilateral infiltrates CT angio: No PE, normal aorta, diffuse patchy bilateral airspace disease, reactive lymph nodes  Official radiology report(s): DG Chest 2 View  Result Date: 11/30/2020 CLINICAL DATA:  Chest pain and shortness of breath. EXAM: CHEST - 2 VIEW COMPARISON:  September 09, 2012 FINDINGS: Moderate severity bilateral multifocal infiltrates are seen along the periphery of both lungs. There is no evidence of a pleural effusion or pneumothorax. The heart size and mediastinal contours are within normal limits. The visualized skeletal structures are unremarkable. IMPRESSION: Moderate severity bilateral multifocal infiltrates. Electronically Signed   By: Virgina Norfolk M.D.   On: 11/30/2020 18:51   CT Angio Chest PE W and/or Wo Contrast  Result Date: 11/30/2020 CLINICAL DATA:  Chest pain and shortness of breath. EXAM: CT ANGIOGRAPHY CHEST WITH CONTRAST TECHNIQUE: Multidetector CT imaging of the chest was performed using the standard protocol during bolus administration of intravenous contrast. Multiplanar CT image reconstructions and MIPs were obtained to evaluate the vascular anatomy. CONTRAST:  66m OMNIPAQUE IOHEXOL 350 MG/ML SOLN COMPARISON:  Chest CT from 2013. FINDINGS: Cardiovascular: The heart is normal in size. No pericardial effusion. The aorta is normal in caliber. No dissection. Minimal scattered atherosclerotic calcifications. Branch vessels are patent. Scattered coronary artery calcifications. The pulmonary arterial tree is well opacified. No filling defects to suggest pulmonary embolism. Mediastinum/Nodes: Borderline enlarged  mediastinal and hilar lymph nodes. Right hilar node on image number 42/4 measures 15.5 mm.  Upper anterior mediastinal lymph node on image number 24/4 measures 8 mm. Subcarinal node on image number 44/4 measures 16 mm. Lower right subcarinal node on image number 48/4 measures 11.5 mm. Left infrahilar lymph node on image number 51/4 measures 10.5 mm. The esophagus is grossly normal. Lungs/Pleura: Patchy peripheral ground-glass type infiltrates and interstitial thickening most consistent with atypical pneumonia/COVID pneumonia. No worrisome pulmonary lesions. No pleural effusions. No pneumothorax. The central tracheobronchial tree is unremarkable. Upper Abdomen: No significant upper abdominal findings. No hepatic or adrenal gland lesions are identified. Musculoskeletal: No chest wall mass, supraclavicular or axillary adenopathy. The bony structures are unremarkable. Review of the MIP images confirms the above findings. IMPRESSION: 1. No CT findings for pulmonary embolism. 2. Normal thoracic aorta. 3. Scattered coronary artery calcifications. 4. Diffuse patchy peripheral ground-glass type infiltrates and interstitial thickening most consistent with atypical pneumonia/COVID pneumonia. 5. Borderline enlarged mediastinal and hilar lymph nodes, likely reactive and due to the lung findings. 6. Aortic atherosclerosis. Aortic Atherosclerosis (ICD10-I70.0). Electronically Signed   By: Marijo Sanes M.D.   On: 11/30/2020 19:36    ____________________________________________  PROCEDURES   Procedure(s) performed (including Critical Care):  Procedures  ____________________________________________  INITIAL IMPRESSION / MDM / [place] / ED COURSE  As part of my medical decision making, I reviewed the following data within the [place] notes reviewed and incorporated, Old chart reviewed, Notes from prior ED visits, and [place] Controlled Substance Database       *[name] was  evaluated in Emergency Department on 12/01/2020 for the symptoms described in the history of present illness. He was evaluated in the context of the global COVID-19 pandemic, which necessitated consideration that the patient might be at risk for infection with the SARS-CoV-2 virus that causes COVID-19. Institutional protocols and algorithms that pertain to the evaluation of patients at risk for COVID-19 are in a state of rapid change based on information released by regulatory bodies including the CDC and federal and state organizations. These policies and algorithms were followed during the patient's care in the ED.  Some ED evaluations and interventions may be delayed as a result of limited staffing during the pandemic.*     Medical Decision Making:  65 yo M here with abnormal D-Dimer in setting of COVID-19. Pt well appearing and in NAD on arrival here. Labs show mild hyperglycemia w/o DKA. WBC normal. Hgb normal. Trop neg. EKG non ischemic. CT angio performed, swhos no signs of PE. He has scattered airspace dz c/w COVID-19. He is not hypoxic here. He does have diffuse wheezing, was started on steroids, and reports increased sputum production. Feel it's reasonable to add doxy for atypical coverage. Otherwise, given neg CT and well appearance with sats >90%, discussed management options with pt and he would like to continue care at home. Return precautions given.  ____________________________________________  FINAL CLINICAL IMPRESSION(S) / ED DIAGNOSES  Final diagnoses:  Atypical pneumonia  SOB (shortness of breath)     MEDICATIONS GIVEN DURING THIS VISIT:  Medications  iohexol (OMNIPAQUE) 350 MG/ML injection 75 mL (75 mLs Intravenous Contrast Given 11/30/20 1922)  doxycycline (VIBRA-TABS) tablet 100 mg (100 mg Oral Given 11/30/20 2216)     ED Discharge Orders         Ordered    doxycycline (VIBRAMYCIN) 100 MG capsule  2 times daily        11/30/20 2217           Note:  This document  was  prepared using Systems analyst and may include unintentional dictation errors.   Duffy Bruce, MD 12/01/20 [id number]

## 2020-12-05 ENCOUNTER — Telehealth: Payer: Self-pay | Admitting: Pharmacist

## 2020-12-10 NOTE — Progress Notes (Addendum)
Chronic Care Management Pharmacy Assistant   Name: [name]  MRN: [id number] DOB: 1956/10/10  Reason for Encounter: Disease State/COPD  Patient Questions:  Have you seen any other providers since your last visit? Yes/ Last PharmD visit was 11-23-20.   11-26-20 follow up with Dr. Althia Forts Endocrinology:  New RX for FreeStyle Libre Continuous Glucose Monitor.    11-29-20: Dr. Raul Del with [facility].  (Initial Consult) RX for Tussionex 10-34m/5ml every 12 hours PRN for Cough. RX for Colcrys 0.6 mg take 1 tablet my mouth daily  Prednisone 169m 1 tablet by mouth daily/qty 30.  Tessalon 10058m1 tablet by mouth three times daily PRN for cough/ up to 7 days.  D-Dimer was ordered STAT Results noted of 625.44 (Range is 0.00-499.0)     11-30-20: Hospital visit for increased shortness of breath. (Details of ER visit below)  PCP : System, Provider Not In  Allergies:  No Known Allergies  Medications: Outpatient Encounter Medications as of 12/05/2020  Medication Sig Note   albuterol (VENTOLIN HFA) 108 (90 Base) MCG/ACT inhaler Inhale 2 puffs into the lungs every 6 (six) hours as needed for wheezing or shortness of breath.    Ascorbic Acid (VITAMIN C) 1000 MG tablet Take 1,000 mg by mouth daily.    aspirin 81 MG chewable tablet Chew 81 mg by mouth every evening.    atorvastatin (LIPITOR) 40 MG tablet Take 1 tablet (40 mg total) by mouth daily.    benzonatate (TESSALON) 200 MG capsule Take 1 capsule (200 mg total) by mouth 2 (two) times daily as needed for cough. (Patient not taking: Reported on 11/23/2020)    Blood Glucose Monitoring Suppl (ACCU-CHEK AVIVA PLUS) w/Device KIT Use kit to check sugar daily (Patient not taking: Reported on 11/23/2020)    Cholecalciferol (VITAMIN D-3 PO) Take 5,000 Units/day by mouth daily.     clonazePAM (KLONOPIN) 1 MG tablet Take 1 tablet (1 mg total) by mouth daily as needed for anxiety.    [EXPIRED] doxycycline (VIBRAMYCIN) 100 MG  capsule Take 1 capsule (100 mg total) by mouth 2 (two) times daily for 7 days.    Dulaglutide (TRULICITY) 0.79.35/TS/1.7BLPN Inject once a week    empagliflozin (JARDIANCE) 25 MG TABS tablet Take 1 tablet (25 mg total) by mouth daily.    Melatonin 10 MG TABS Take 1 tablet by mouth. 01/03/2020: PRN   metFORMIN (GLUCOPHAGE) 500 MG tablet Take 2 tablets (1,000 mg total) by mouth 2 (two) times daily.    Multiple Vitamins-Minerals (MULTIVITAMIN ADULT PO) Take by mouth.    Multiple Vitamins-Minerals (VISION PLUS PO) Take by mouth.    omeprazole (PRILOSEC) 40 MG capsule Take 1 capsule (40 mg total) by mouth in the morning and at bedtime.    ONE TOUCH ULTRA TEST test strip USE TO CHECK BLOOD SUGAR LEVELS THREE TIMES DAILY. DX:E11.9 (Patient not taking: Reported on 11/23/2020)    sucralfate (CARAFATE) 1 g tablet Take 1 tablet (1 g total) by mouth 2 (two) times daily.    traMADol (ULTRAM) 50 MG tablet Take 50-100 mg by mouth 4 (four) times daily as needed.    vitamin E 400 UNIT capsule Take 400 Units by mouth daily.    Zinc 50 MG TABS Take 50 mg by mouth daily.    No facility-administered encounter medications on file as of 12/05/2020.    Current Diagnosis: Patient Active Problem List   Diagnosis Date Noted   COVID-19 10/16/2020   Stress at  work 04/26/2018   Insomnia due to stress 07/29/2016   Diabetes mellitus associated with hormonal etiology ([place]) 08/01/2015   Hyperlipemia 08/01/2015    Current COPD regimen: Albuterol inhaler: rescue inhaler  Any recent hospitalizations or ED visits since last visit with CPP? Yes see below: 11-30-20: Hospital visit for increased shortness of breath.  Patient instructed to present to ER due to D-Dimer result. EKG performed with normal sinus rhythm Chest X-Ray performed with impression of Bilateral infiltrates CT [place] performed with No CT findings for pulmonary embolism. 2. Normal thoracic aorta. 3. Scattered coronary artery calcifications. 4. Diffuse patchy  peripheral ground-glass type infiltrates and interstitial thickening most consistent with atypical pneumonia/COVID pneumonia. 5. Borderline enlarged mediastinal and hilar lymph nodes, likely reactive and due to the lung findings. 6. Aortic atherosclerosis.  DX of atypical pneumonia and shortness of breath.  RX given for Doxycycline 166m: 2 tabs daily.   Reports COPD symptoms, including Increased shortness of breath , Symptoms worse with exercise and Symptoms worse at night.  What recent interventions/DTPs have been made by any provider to improve breathing since last visit: Ordered on 11-29-20 by Pulmonology. RX for Tussionex 10-842m5ml every 12 hours PRN for Cough. RX for Colcrys 0.6 mg take 1 tablet my mouth daily. Prednisone 1077m1 tablet by mouth daily/qty 30.  Tessalon 100m1m tablet by mouth three times daily PRN for cough/ up to 7 days.   Have you had exacerbation/flare-up since last visit? Yes   What do you do when you are short of breath?  Adhere to COPD Action Plan, Rescue medication, Anxiety medication and Rest  Respiratory Devices/Equipment Do you have a nebulizer? No Do you use a Peak Flow Meter? Yes Do you use a maintenance inhaler? No Do you use a rescue inhaler? Yes How often do you use your rescue inhaler?  daily since being diagnosed with COVID-19.  Do you use a spacer with your inhaler? No  Adherence Review: Does the patient have >5 day gap between last estimated fill date for maintenance inhaler medications? Patient does not take maintenance inhaler.   12-05-20: The patient reported he is still having issues with his "breathing". I noted during the phone conversation that the patient was short of breath. He explained he just returned from outside. I instructed and guided the patient through some deep breathing techniques.  The patient performed as directed and his shortness of breath improved. He continued to explain that he is having to use his rescue inhaler more  frequently, especially at night. Also explained that when he talks he starts to cough non-stop and nothing was helping him with his cough.  His wife reported that at night his O2 drops in the '80s. With the lowest reading reported at 84%. The wife expressed frustration because she feels her husband is not getting any better. She reported " he needs therapy or something to make his lungs stronger or breathing better." The wife and patient requested for a possible home health referral to be ordered for Mr. ReidSaulsexplained to Mrs. ReidMcadoryould forward their concerns and request to PharmD JuliBirdena Crandalltient-reported he is taking all his medications ordered and does not need any medications delivered at this time.   AmanCloretta NedN Clinical Pharmacist Assistant  [phone] Follow-Up:  Pharmacist Review  I have reviewed the care management and care coordination activities outlined in this encounter and I am certifying that I agree with the content of this note.  JuliAlmyra Free  Doreene Adas PharmD, [facility] [phone]

## 2020-12-17 ENCOUNTER — Ambulatory Visit: Payer: HMO | Admitting: Internal Medicine

## 2020-12-20 ENCOUNTER — Other Ambulatory Visit: Payer: Self-pay

## 2020-12-20 ENCOUNTER — Ambulatory Visit (INDEPENDENT_AMBULATORY_CARE_PROVIDER_SITE_OTHER): Payer: HMO | Admitting: Internal Medicine

## 2020-12-20 ENCOUNTER — Encounter: Payer: Self-pay | Admitting: Internal Medicine

## 2020-12-20 VITALS — Ht 69.88 in | Wt 165.2 lb

## 2020-12-20 DIAGNOSIS — Z125 Encounter for screening for malignant neoplasm of prostate: Secondary | ICD-10-CM

## 2020-12-20 DIAGNOSIS — E109 Type 1 diabetes mellitus without complications: Secondary | ICD-10-CM

## 2020-12-20 DIAGNOSIS — E119 Type 2 diabetes mellitus without complications: Secondary | ICD-10-CM | POA: Diagnosis not present

## 2020-12-20 MED ORDER — ALBUTEROL SULFATE HFA 108 (90 BASE) MCG/ACT IN AERS: 2.0000 | INHALATION_SPRAY | Freq: Four times a day (QID) | RESPIRATORY_TRACT | 6 refills | Status: AC | PRN

## 2020-12-20 NOTE — Progress Notes (Signed)
Ht 5' 9.88" (1.775 m)   Wt 165 lb 3.2 oz (74.9 kg)   BMI 23.78 kg/m    Subjective:    Patient ID: [name], male    DOB: Jan 11, 1956, 65 y.o.   MRN: [id number]  HPI: [name] is a 65 y.o. male  Pt is here to establish care Pt was tx for covid in dec pna was placed on doxycycline, prenisone, colchicine - per Dr. Meredeth Ide Pulmonology.    Diabetes He presents for his follow-up (FSBS - about 104 goes up some sec to being on pred is on metformin jardiance and trulicity ) diabetic visit. He has type 2 diabetes mellitus. Pertinent negatives for hypoglycemia include no headaches. Pertinent negatives for diabetes include no chest pain and no weight loss.  Cough This is a chronic (has a dry cough , is on pred 10 mg and colchicine feb 10 th x 1 month) problem. The current episode started more than 1 month ago. The problem has been gradually improving. Associated symptoms include nasal congestion and shortness of breath. Pertinent negatives include no chest pain, chills, ear congestion, ear pain, fever, headaches, heartburn, hemoptysis, myalgias, postnasal drip, rash, rhinorrhea, sore throat, weight loss or wheezing.    Chief Complaint  Patient presents with  . Hospitalization Follow-up    Was hospitalized for Covid 10/23/20 to the 11/02/20 Was recently seen in ER for possible pulmonary embolism.      Relevant past medical, surgical, family and social history reviewed and updated as indicated. Interim medical history since our last visit reviewed. Allergies and medications reviewed and updated.  Review of Systems  Constitutional: Negative for chills, fever and weight loss.  HENT: Negative for ear pain, postnasal drip, rhinorrhea and sore throat.   Respiratory: Positive for cough and shortness of breath. Negative for hemoptysis and wheezing.   Cardiovascular: Negative for chest pain.  Gastrointestinal: Negative for heartburn.  Musculoskeletal: Negative for myalgias.  Skin: Negative  for rash.  Neurological: Negative for headaches.    Per HPI unless specifically indicated above     Objective:    Ht 5' 9.88" (1.775 m)   Wt 165 lb 3.2 oz (74.9 kg)   BMI 23.78 kg/m   Wt Readings from Last 3 Encounters:  12/20/20 165 lb 3.2 oz (74.9 kg)  11/30/20 163 lb 2.3 oz (74 kg)  11/21/20 164 lb 2 oz (74.4 kg)    Physical Exam Vitals and nursing note reviewed.  Constitutional:      General: He is not in acute distress.    Appearance: Normal appearance. He is not ill-appearing or diaphoretic.  HENT:     Head: Normocephalic and atraumatic.     Right Ear: Tympanic membrane and external ear normal.     Left Ear: External ear normal.     Nose: No congestion or rhinorrhea.     Mouth/Throat:     Pharynx: No oropharyngeal exudate.  Eyes:     Conjunctiva/sclera: Conjunctivae normal.     Pupils: Pupils are equal, round, and reactive to light.  Cardiovascular:     Rate and Rhythm: Normal rate and regular rhythm.     Heart sounds: No murmur heard. No friction rub. No gallop.   Pulmonary:     Effort: No respiratory distress.     Breath sounds: No stridor. No wheezing or rhonchi.  Chest:     Chest wall: No tenderness.  Musculoskeletal:     Cervical back: Normal range of motion and neck supple. No rigidity  or tenderness.  Skin:    General: Skin is warm and dry.  Neurological:     Mental Status: He is alert and oriented to person, place, and time.  Psychiatric:        Mood and Affect: Mood normal.        Behavior: Behavior normal.        Thought Content: Thought content normal.        Judgment: Judgment normal.     Results for orders placed or performed during the hospital encounter of 11/30/20  Basic metabolic panel  Result Value Ref Range   Sodium 134 (L) 135 - 145 mmol/L   Potassium 4.4 3.5 - 5.1 mmol/L   Chloride 102 98 - 111 mmol/L   CO2 21 (L) 22 - 32 mmol/L   Glucose, Bld 276 (H) 70 - 99 mg/dL   BUN 19 8 - 23 mg/dL   Creatinine, Ser 8.54 0.61 - 1.24 mg/dL    Calcium 9.2 8.9 - 62.7 mg/dL   GFR, Estimated >03 >50 mL/min   Anion gap 11 5 - 15  CBC  Result Value Ref Range   WBC 5.4 4.0 - 10.5 K/uL   RBC 4.67 4.22 - 5.81 MIL/uL   Hemoglobin 14.0 13.0 - 17.0 g/dL   HCT 09.3 81.8 - 29.9 %   MCV 89.9 80.0 - 100.0 fL   MCH 30.0 26.0 - 34.0 pg   MCHC 33.3 30.0 - 36.0 g/dL   RDW 37.1 69.6 - 78.9 %   Platelets 172 150 - 400 K/uL   nRBC 0.0 0.0 - 0.2 %  Troponin I (High Sensitivity)  Result Value Ref Range   Troponin I (High Sensitivity) 3 <18 ng/L  Troponin I (High Sensitivity)  Result Value Ref Range   Troponin I (High Sensitivity) 2 <18 ng/L      Assessment & Plan:  1 shortness of breath/ cough persistent , ACUTE : probably secondary to Covid and underlying COPD patient is a smoker with a 30-pack-year history of smoking quit 30 years ago however did smoke for 30 years prior to that. Will need PFTs follow-up with pulmonology tomorrow Patient was administered 1 round of albuterol nebulization.  Added Spiriva at this visit patient to call if this helps with his cough and shortness of breath.  Next  2.  DM :  dose of prednisone needs to be addressed given that patient has had hypoglycemic episodes postprandial of around 300-3 50 See endocrinology will need to follow-up check HbA1c,  urine  microalbumin  diabetic diet plan given to pt  adviced regarding hypoglycemia and instructions given to pt today on how to prevent and treat the same if it were to occur. pt acknowledges the plan and voices understanding of the same.  exercise plan given and encouraged.   advice diabetic yearly podiatry, ophthalmology , nutritionist , dental check q 6 months,  Problem List Items Addressed This Visit   None      Follow up plan: No follow-ups on file.  Health Maintenance : Cscope : 03/15/2019  Pneumonia vaccine :prevanar and pneumovax 2022 , 2006 FLu vaccine :2021

## 2020-12-21 DIAGNOSIS — R059 Cough, unspecified: Secondary | ICD-10-CM | POA: Diagnosis not present

## 2020-12-21 DIAGNOSIS — R0600 Dyspnea, unspecified: Secondary | ICD-10-CM | POA: Diagnosis not present

## 2020-12-21 DIAGNOSIS — R9389 Abnormal findings on diagnostic imaging of other specified body structures: Secondary | ICD-10-CM | POA: Diagnosis not present

## 2020-12-21 DIAGNOSIS — Z8616 Personal history of COVID-19: Secondary | ICD-10-CM | POA: Diagnosis not present

## 2020-12-21 DIAGNOSIS — R918 Other nonspecific abnormal finding of lung field: Secondary | ICD-10-CM | POA: Diagnosis not present

## 2020-12-21 DIAGNOSIS — U071 COVID-19: Secondary | ICD-10-CM | POA: Diagnosis not present

## 2020-12-24 ENCOUNTER — Telehealth: Payer: Self-pay

## 2020-12-25 ENCOUNTER — Ambulatory Visit: Payer: HMO | Admitting: Internal Medicine

## 2021-01-09 DIAGNOSIS — I700 Atherosclerosis of aorta: Secondary | ICD-10-CM | POA: Insufficient documentation

## 2021-01-21 ENCOUNTER — Ambulatory Visit: Payer: HMO | Admitting: Internal Medicine

## 2021-01-22 ENCOUNTER — Telehealth: Payer: Self-pay

## 2021-01-22 NOTE — Chronic Care Management (AMB) (Signed)
Chronic Care Management Pharmacy Assistant   Name: [name]  MRN: [id number] DOB: Feb 27, 1956    Reason for Encounter: Medication Review-Medication Coordination    Recent office visits:  12/20/20 Charlynne Cousins, MD (PCP)  Recent consult visits:  12/21/2020-[facility] visits:  Medication Reconciliation was completed by comparing discharge summary, patient's EMR and Pharmacy list, and upon discussion with patient.  Admitted to the hospital on 11/30/2020 due to Atypical Pneumonia. Discharge date was 11/30/2020. Discharged from [facility].    New?Medications Started at [facility] Discharge:?? -started Doxycycline 100 mg due to pneumonia  Medication Changes at Hospital Discharge: -Changed none  Medications Discontinued at Hospital Discharge: -Stopped none  Medications that remain the same after Hospital Discharge:??  -All other medications will remain the same.    Medications: Outpatient Encounter Medications as of 01/22/2021  Medication Sig Note  . albuterol (VENTOLIN HFA) 108 (90 Base) MCG/ACT inhaler Inhale 2 puffs into the lungs every 6 (six) hours as needed for wheezing or shortness of breath.   . Ascorbic Acid (VITAMIN C) 1000 MG tablet Take 1,000 mg by mouth daily.   [name] aspirin 81 MG chewable tablet Chew 81 mg by mouth every evening.   [name] atorvastatin (LIPITOR) 40 MG tablet Take 1 tablet (40 mg total) by mouth daily.   . benzonatate (TESSALON) 200 MG capsule Take 1 capsule (200 mg total) by mouth 2 (two) times daily as needed for cough.   . Blood Glucose Monitoring Suppl (ACCU-CHEK AVIVA PLUS) w/Device KIT Use kit to check sugar daily   . chlorpheniramine-HYDROcodone (TUSSIONEX) 10-8 MG/5ML SUER Take by mouth.   . Cholecalciferol (VITAMIN D-3 PO) Take 5,000 Units/day by mouth daily.    . clonazePAM (KLONOPIN) 1 MG tablet Take 1 tablet (1 mg total) by mouth daily as needed for anxiety.   . colchicine 0.6 MG tablet Take by mouth.   .  Continuous Blood Gluc Receiver (FREESTYLE LIBRE 2 READER) DEVI Use to monitor blood glucose.   . Dulaglutide (TRULICITY) 9.93 TT/0.1XB SOPN Inject once a week   . empagliflozin (JARDIANCE) 25 MG TABS tablet Take 1 tablet (25 mg total) by mouth daily.   . Melatonin 10 MG TABS Take 1 tablet by mouth. 01/03/2020: PRN  . metFORMIN (GLUCOPHAGE) 500 MG tablet Take 2 tablets (1,000 mg total) by mouth 2 (two) times daily.   . Multiple Vitamins-Minerals (MULTIVITAMIN ADULT PO) Take by mouth.   . Multiple Vitamins-Minerals (VISION PLUS PO) Take by mouth.   [name] omeprazole (PRILOSEC) 40 MG capsule Take 1 capsule (40 mg total) by mouth in the morning and at bedtime.   . ONE TOUCH ULTRA TEST test strip USE TO CHECK BLOOD SUGAR LEVELS THREE TIMES DAILY. DX:E11.9   . predniSONE (DELTASONE) 10 MG tablet Take 1 tablet by mouth daily.   . sucralfate (CARAFATE) 1 g tablet Take 1 tablet (1 g total) by mouth 2 (two) times daily.   . traMADol (ULTRAM) 50 MG tablet Take 50-100 mg by mouth 4 (four) times daily as needed.   . vitamin E 400 UNIT capsule Take 400 Units by mouth daily.   . Zinc 50 MG TABS Take 50 mg by mouth daily.    No facility-administered encounter medications on file as of 01/22/2021.   Reviewed chart for medication changes ahead of medication coordination call.  No OVs, Consults, or hospital visits since last care coordination call/Pharmacist visit. (If appropriate, list visit date, provider name)  No medication changes indicated OR if recent  visit, treatment plan here.  BP Readings from Last 3 Encounters:  11/30/20 123/71  11/21/20 135/77  10/16/20 137/63    Lab Results  Component Value Date   HGBA1C 6.9 08/31/2020     Patient obtains medications through Adherence Packaging  90 Days   Last adherence delivery included:  Omperazole 40 mg take 1 caps at breakfast and bedtime Metformin 500 mg Take 2 tablets at breakfast and at bedtime Atorvastatin 40 mg Take 1 tablet everyday at  bedtime    Patient is due for next adherence delivery on: 01/23/2021. Called patient and reviewed medications and coordinated delivery.  This delivery to include: Omperazole 40 mg take 1 caps at breakfast and bedtime Metformin 500 mg Take 2 tablets at breakfast and at bedtime Atorvastatin 40 mg Take 1 tablet everyday at bedtime  Patient will need a short fill of (med), prior to adherence delivery. (To align with sync date or if PRN med)   Confirmed delivery date of 01/23/2021, advised patient that pharmacy will contact them the morning of delivery.   Star Rating Drugs: Atorvastatin 40 mg Jardiance 25 mg Metformin 534m  JDiv[facility] HospitalClinical Pharmacist Assistant [phone]

## 2021-01-23 DIAGNOSIS — J432 Centrilobular emphysema: Secondary | ICD-10-CM | POA: Diagnosis not present

## 2021-01-23 DIAGNOSIS — J189 Pneumonia, unspecified organism: Secondary | ICD-10-CM | POA: Diagnosis not present

## 2021-01-23 DIAGNOSIS — J22 Unspecified acute lower respiratory infection: Secondary | ICD-10-CM | POA: Diagnosis not present

## 2021-01-23 DIAGNOSIS — U071 COVID-19: Secondary | ICD-10-CM | POA: Diagnosis not present

## 2021-01-23 DIAGNOSIS — R0600 Dyspnea, unspecified: Secondary | ICD-10-CM | POA: Diagnosis not present

## 2021-01-24 DIAGNOSIS — M238X2 Other internal derangements of left knee: Secondary | ICD-10-CM | POA: Diagnosis not present

## 2021-01-29 ENCOUNTER — Telehealth: Payer: Self-pay

## 2021-01-29 DIAGNOSIS — H353131 Nonexudative age-related macular degeneration, bilateral, early dry stage: Secondary | ICD-10-CM | POA: Diagnosis not present

## 2021-01-29 DIAGNOSIS — E119 Type 2 diabetes mellitus without complications: Secondary | ICD-10-CM | POA: Diagnosis not present

## 2021-01-29 NOTE — Chronic Care Management (AMB) (Signed)
Chronic Care Management Pharmacy Assistant   Name: [name]  MRN: [id number] DOB: Dec 19, 1955  Reason for Encounter: Diabetes Mellitus Disease State Call  Recent office visits:  12/20/20- Charlynne Cousins, MD- Administered one round of albuterol nebulization  Recent consult visits:  01/24/21- Skip Estimable (Orthopedics)- Chondral defect of left patella, imaging done, no medication changes indicated 01/23/21- Herbon Fleming (Pulmonolgy)- Lower respiratory tract infection due to covid-19, no medication changes indicated 12/28/20- [facility]bon Fleming (Pulmonolgy)- procedure visit, [facility] visits:  "Any recent hospitalizations or ED visits since last visit with CPP? Yes see below: 11-30-20: Hospital visit for increased shortness of breath.   Patient instructed to present to ER due to D-Dimer result.  EKG performed with normal sinus rhythm  Chest X-Ray performed with impression of Bilateral infiltrates  CT [place] performed with No CT findings for pulmonary embolism. 2. Normal thoracic aorta. 3. Scattered coronary artery calcifications. 4. Diffuse patchy peripheral ground-glass type infiltrates and interstitial thickening most consistent with atypical pneumonia/COVID pneumonia. 5. Borderline enlarged mediastinal and hilar lymph nodes, likely reactive and due to the lung findings. 6. Aortic atherosclerosis.   DX of atypical pneumonia and shortness of breath.   RX given for Doxycycline 159m: 2 tabs daily. "    Medications: Outpatient Encounter Medications as of 01/29/2021  Medication Sig Note  . albuterol (VENTOLIN HFA) 108 (90 Base) MCG/ACT inhaler Inhale 2 puffs into the lungs every 6 (six) hours as needed for wheezing or shortness of breath.   . Ascorbic Acid (VITAMIN C) 1000 MG tablet Take 1,000 mg by mouth daily.   .Marland Kitchenaspirin 81 MG chewable tablet Chew 81 mg by mouth every evening.   .Marland Kitchenatorvastatin (LIPITOR) 40 MG tablet Take 1 tablet (40 mg total) by mouth daily.   .  benzonatate (TESSALON) 200 MG capsule Take 1 capsule (200 mg total) by mouth 2 (two) times daily as needed for cough.   . Blood Glucose Monitoring Suppl (ACCU-CHEK AVIVA PLUS) w/Device KIT Use kit to check sugar daily   . chlorpheniramine-HYDROcodone (TUSSIONEX) 10-8 MG/5ML SUER Take by mouth.   . Cholecalciferol (VITAMIN D-3 PO) Take 5,000 Units/day by mouth daily.    . clonazePAM (KLONOPIN) 1 MG tablet Take 1 tablet (1 mg total) by mouth daily as needed for anxiety.   . colchicine 0.6 MG tablet Take by mouth.   . Continuous Blood Gluc Receiver (FREESTYLE LIBRE 2 READER) DEVI Use to monitor blood glucose.   . Dulaglutide (TRULICITY) 09.93MZJ/6.9CVSOPN Inject once a week   . empagliflozin (JARDIANCE) 25 MG TABS tablet Take 1 tablet (25 mg total) by mouth daily.   . Melatonin 10 MG TABS Take 1 tablet by mouth. 01/03/2020: PRN  . metFORMIN (GLUCOPHAGE) 500 MG tablet Take 2 tablets (1,000 mg total) by mouth 2 (two) times daily.   . Multiple Vitamins-Minerals (MULTIVITAMIN ADULT PO) Take by mouth.   . Multiple Vitamins-Minerals (VISION PLUS PO) Take by mouth.   .Marland Kitchenomeprazole (PRILOSEC) 40 MG capsule Take 1 capsule (40 mg total) by mouth in the morning and at bedtime.   . ONE TOUCH ULTRA TEST test strip USE TO CHECK BLOOD SUGAR LEVELS THREE TIMES DAILY. DX:E11.9   . predniSONE (DELTASONE) 10 MG tablet Take 1 tablet by mouth daily.   . sucralfate (CARAFATE) 1 g tablet Take 1 tablet (1 g total) by mouth 2 (two) times daily.   . traMADol (ULTRAM) 50 MG tablet Take 50-100 mg by mouth 4 (four) times daily as needed.   .[name]  vitamin E 400 UNIT capsule Take 400 Units by mouth daily.   . Zinc 50 MG TABS Take 50 mg by mouth daily.    No facility-administered encounter medications on file as of 01/29/2021.   Recent Relevant Labs: Lab Results  Component Value Date/Time   HGBA1C 6.9 08/31/2020 09:43 AM   HGBA1C 6.5 (H) 01/19/2020 11:58 AM   HGBA1C 6.7 (H) 07/25/2019 02:09 PM   HGBA1C 6.4 09/06/2018 04:03 PM    MICROALBUR 30 (H) 08/31/2020 09:43 AM   MICROALBUR 10 04/26/2018 02:53 PM    Kidney Function Lab Results  Component Value Date/Time   CREATININE 0.92 11/30/2020 06:11 PM   CREATININE 0.89 08/31/2020 09:46 AM   CREATININE 1.11 12/07/2012 10:57 PM   CREATININE 0.86 09/11/2012 05:05 AM   GFRNONAA >60 11/30/2020 06:11 PM   GFRNONAA >60 12/07/2012 10:57 PM   GFRAA 103 08/31/2020 09:46 AM   GFRAA >60 12/07/2012 10:57 PM   All information is from patient's wife. She was unable to give me readings from the freestyle libre  Current antihyperglycemic regimen:  Jardiance 25 mg daily Trulicity 9.76 mg once weekly metformin 1000 mg Two tablets twice daily   What recent interventions/DTPs have been made to improve glycemic control:  No recent interventions  Have there been any recent hospitalizations or ED visits since last visit with CPP? No   Patient denies hypoglycemic symptoms, including Sweaty, Shaky, Hungry, Nervous/irritable, Vision changes and None   Patient denies hyperglycemic symptoms, including blurry vision, excessive thirst, fatigue, polyuria and weakness   How often are you checking your blood sugar? Patient's wife stated he checks BS daily  What are your blood sugars ranging?  o Fasting: n/a o Before meals: n/a o After meals: n/a o Bedtime: n/a  During the week, how often does your blood glucose drop below 70? Never   Are you checking your feet daily/regularly?  Patient's wife stated Mr. Curtez feet are in good health  Adherence Review: Is the patient currently on a STATIN medication? Yes  Atorvastatin 30m- 90DS last filled 11/19/20  Is the patient currently on ACE/ARB medication? No   Does the patient have >5 day gap between last estimated fill dates? No  Star Rating Drugs: Atorvastatin 471m 90DS last filled 0173/41/93RULICITY 0.7.90GWI/0.9BDOPN- Patient Assistance JARDIANCE 25 MG- 90DS last filled 03/11/2 Metformin 50032m90DS last filled  11/19/20  [facility]

## 2021-02-10 ENCOUNTER — Other Ambulatory Visit: Payer: Self-pay | Admitting: Family Medicine

## 2021-02-10 DIAGNOSIS — E1169 Type 2 diabetes mellitus with other specified complication: Secondary | ICD-10-CM

## 2021-02-10 NOTE — Telephone Encounter (Signed)
Requested Prescriptions  Pending Prescriptions Disp Refills  . metFORMIN (GLUCOPHAGE) 500 MG tablet [Pharmacy Med Name: metformin 500 mg tablet] 120 tablet 0    Sig: TAKE TWO TABLETS BY MOUTH AT BREAKFAST AND AT BEDTIME     Endocrinology:  Diabetes - Biguanides Passed - 02/10/2021  8:04 AM      Passed - Cr in normal range and within 360 days    Creatinine  Date Value Ref Range Status  12/07/2012 1.11 0.60 - 1.30 mg/dL Final   Creatinine, Ser  Date Value Ref Range Status  11/30/2020 0.92 0.61 - 1.24 mg/dL Final         Passed - HBA1C is between 0 and 7.9 and within 180 days    HB A1C (BAYER DCA - WAIVED)  Date Value Ref Range Status  08/31/2020 6.9 <7.0 % Final    Comment:                                          Diabetic Adult            <7.0                                       Healthy Adult        4.3 - 5.7                                                           (DCCT/NGSP) American Diabetes Association's Summary of Glycemic Recommendations for Adults with Diabetes: Hemoglobin A1c <7.0%. More stringent glycemic goals (A1c <6.0%) may further reduce complications at the cost of increased risk of hypoglycemia.          Passed - eGFR in normal range and within 360 days    EGFR (African American)  Date Value Ref Range Status  12/07/2012 >60  Final   GFR calc Af Amer  Date Value Ref Range Status  08/31/2020 103 >59 mL/min/1.73 Final    Comment:    **In accordance with recommendations from the NKF-ASN Task force,**   [facility] is in the process of updating its eGFR calculation to the   2021 CKD-EPI creatinine equation that estimates kidney function   without a race variable.    EGFR (Non-African Amer.)  Date Value Ref Range Status  12/07/2012 >60  Final    Comment:    eGFR values <25m/min/1.73 m2 may be an indication of chronic kidney disease (CKD). Calculated eGFR is useful in patients with stable renal function. The eGFR calculation will not be reliable in acutely  ill patients when serum creatinine is changing rapidly. It is not useful in  patients on dialysis. The eGFR calculation may not be applicable to patients at the low and high extremes of body sizes, pregnant women, and vegetarians.    GFR, Estimated  Date Value Ref Range Status  11/30/2020 >60 >60 mL/min Final    Comment:    (NOTE) Calculated using the CKD-EPI Creatinine Equation (2021)          Passed - Valid encounter within last 6 months    Recent Outpatient Visits  1 month ago Type 1 diabetes mellitus without complications [facility])   [place] Vigg, Avanti, MD   2 months ago [place], Alison M, DO   3 months ago COVID-58   [facility] Myles Gip, DO   5 months ago Diabetes mellitus associated with hormonal etiology [facility])   [place], Murdock P, DO   1 year ago Diabetes mellitus associated with hormonal etiology [facility])   [place], [place], [place]      Future Appointments            In 9 months [facility], [place]

## 2021-02-12 ENCOUNTER — Other Ambulatory Visit: Payer: Self-pay | Admitting: Internal Medicine

## 2021-02-12 NOTE — Telephone Encounter (Signed)
Requested medication (s) are due for refill today:   Yes  Requested medication (s) are on the active medication list:   Yes  Future visit scheduled:   No   Last ordered: 08/31/2020  #90, 1 refill  Returned because pharmacy requesting an alternative.   Not covered by her insurance.   Requested Prescriptions  Pending Prescriptions Disp Refills   JARDIANCE 25 MG TABS tablet [Pharmacy Med Name: Jardiance 25 mg tablet] 30 tablet 0    Sig: TAKE ONE TABLET BY MOUTH EVERY MORNING      Endocrinology:  Diabetes - SGLT2 Inhibitors Failed - 02/12/2021  2:59 PM      Failed - LDL in normal range and within 360 days    LDL Chol Calc (NIH)  Date Value Ref Range Status  08/31/2020 104 (H) 0 - 99 mg/dL Final          Passed - Cr in normal range and within 360 days    Creatinine  Date Value Ref Range Status  12/07/2012 1.11 0.60 - 1.30 mg/dL Final   Creatinine, Ser  Date Value Ref Range Status  11/30/2020 0.92 0.61 - 1.24 mg/dL Final          Passed - HBA1C is between 0 and 7.9 and within 180 days    HB A1C (BAYER DCA - WAIVED)  Date Value Ref Range Status  08/31/2020 6.9 <7.0 % Final    Comment:                                          Diabetic Adult            <7.0                                       Healthy Adult        4.3 - 5.7                                                           (DCCT/NGSP) American Diabetes Association's Summary of Glycemic Recommendations for Adults with Diabetes: Hemoglobin A1c <7.0%. More stringent glycemic goals (A1c <6.0%) may further reduce complications at the cost of increased risk of hypoglycemia.           Passed - eGFR in normal range and within 360 days    EGFR (African American)  Date Value Ref Range Status  12/07/2012 >60  Final   GFR calc Af Amer  Date Value Ref Range Status  08/31/2020 103 >59 mL/min/1.73 Final    Comment:    **In accordance with recommendations from the NKF-ASN Task force,**   [facility] is in the process of  updating its eGFR calculation to the   2021 CKD-EPI creatinine equation that estimates kidney function   without a race variable.    EGFR (Non-African Amer.)  Date Value Ref Range Status  12/07/2012 >60  Final    Comment:    eGFR values <15m/min/1.73 m2 may be an indication of chronic kidney disease (CKD). Calculated eGFR is useful in patients with stable renal function. The eGFR calculation will not be reliable in acutely  ill patients when serum creatinine is changing rapidly. It is not useful in  patients on dialysis. The eGFR calculation may not be applicable to patients at the low and high extremes of body sizes, pregnant women, and vegetarians.    GFR, Estimated  Date Value Ref Range Status  11/30/2020 >60 >60 mL/min Final    Comment:    (NOTE) Calculated using the CKD-EPI Creatinine Equation (2021)           Passed - Valid encounter within last 6 months    Recent Outpatient Visits           1 month ago Type 1 diabetes mellitus without complications ([place])   [place] Vigg, Avanti, MD   2 months ago [place] Myles Gip, DO   3 months ago COVID-60   [place], [place], DO   5 months ago Diabetes mellitus associated with hormonal etiology [facility])   [place], Megan P, DO   1 year ago Diabetes mellitus associated with hormonal etiology [facility])   [place], [place], [place]       Future Appointments             In 9 months [facility]

## 2021-02-12 NOTE — Telephone Encounter (Signed)
Lvm to make apt for med refills.  

## 2021-02-13 ENCOUNTER — Telehealth: Payer: Self-pay

## 2021-02-13 DIAGNOSIS — M1712 Unilateral primary osteoarthritis, left knee: Secondary | ICD-10-CM | POA: Diagnosis not present

## 2021-02-13 NOTE — Telephone Encounter (Signed)
Unable to lvm to make this apt/  

## 2021-02-13 NOTE — Chronic Care Management (AMB) (Signed)
Chronic Care Management Pharmacy Assistant   Name: [name]  MRN: [id number] DOB: 06/13/1956   Reason for Encounter: Medication Review/Medication Coordination Call    Recent office visits:  12/20/20-[name] (PCP)  Recent consult visits:  01/24/21-[name] (Orthopedics) 01/23/21- [name] (Pulmonology) 12/21/20- [name] (Pulmonology)  Hospital visits:  Medication Reconciliation was completed by comparing discharge summary, patient's EMR and Pharmacy list, and upon discussion with patient.  Admitted to the hospital on 11/30/20 due to Atypical Pneumonia. Discharge date was 11/30/20. Discharged from [facility].    New?Medications Started at [facility] Discharge:?? -started Doxycycline 100 mg due to Atypical Pneumonia  Medication Changes at Hospital Discharge: -Changed None  Medications Discontinued at Hospital Discharge: -Stopped None noted  Medications that remain the same after Hospital Discharge:??  -All other medications will remain the same.    Medications: Outpatient Encounter Medications as of 02/13/2021  Medication Sig Note  . albuterol (VENTOLIN HFA) 108 (90 Base) MCG/ACT inhaler Inhale 2 puffs into the lungs every 6 (six) hours as needed for wheezing or shortness of breath.   . Ascorbic Acid (VITAMIN C) 1000 MG tablet Take 1,000 mg by mouth daily.   [name] aspirin 81 MG chewable tablet Chew 81 mg by mouth every evening.   [name] atorvastatin (LIPITOR) 40 MG tablet Take 1 tablet (40 mg total) by mouth daily.   . benzonatate (TESSALON) 200 MG capsule Take 1 capsule (200 mg total) by mouth 2 (two) times daily as needed for cough.   . Blood Glucose Monitoring Suppl (ACCU-CHEK AVIVA PLUS) w/Device KIT Use kit to check sugar daily   . chlorpheniramine-HYDROcodone (TUSSIONEX) 10-8 MG/5ML SUER Take by mouth.   . Cholecalciferol (VITAMIN D-3 PO) Take 5,000 Units/day by mouth daily.    . clonazePAM (KLONOPIN) 1 MG tablet Take 1 tablet (1 mg total) by  mouth daily as needed for anxiety.   . colchicine 0.6 MG tablet Take by mouth.   . Continuous Blood Gluc Receiver (FREESTYLE LIBRE 2 READER) DEVI Use to monitor blood glucose.   . Dulaglutide (TRULICITY) 5.62 BW/3.8LH SOPN Inject once a week   . empagliflozin (JARDIANCE) 25 MG TABS tablet Take 1 tablet (25 mg total) by mouth daily.   . Melatonin 10 MG TABS Take 1 tablet by mouth. 01/03/2020: PRN  . metFORMIN (GLUCOPHAGE) 500 MG tablet TAKE TWO TABLETS BY MOUTH AT BREAKFAST AND AT BEDTIME   . Multiple Vitamins-Minerals (MULTIVITAMIN ADULT PO) Take by mouth.   . Multiple Vitamins-Minerals (VISION PLUS PO) Take by mouth.   [name] omeprazole (PRILOSEC) 40 MG capsule Take 1 capsule (40 mg total) by mouth in the morning and at bedtime.   . ONE TOUCH ULTRA TEST test strip USE TO CHECK BLOOD SUGAR LEVELS THREE TIMES DAILY. DX:E11.9   . predniSONE (DELTASONE) 10 MG tablet Take 1 tablet by mouth daily.   . sucralfate (CARAFATE) 1 g tablet Take 1 tablet (1 g total) by mouth 2 (two) times daily.   . traMADol (ULTRAM) 50 MG tablet Take 50-100 mg by mouth 4 (four) times daily as needed.   . vitamin E 400 UNIT capsule Take 400 Units by mouth daily.   . Zinc 50 MG TABS Take 50 mg by mouth daily.    No facility-administered encounter medications on file as of 02/13/2021.    Reviewed chart for medication changes ahead of medication coordination call.  No OVs, Consults, or hospital visits since last care coordination call/Pharmacist visit. (If appropriate, list visit date, provider name)  No medication changes indicated OR if recent visit, treatment plan here.  BP Readings from Last 3 Encounters:  11/30/20 123/71  11/21/20 135/77  10/16/20 137/63    Lab Results  Component Value Date   HGBA1C 6.9 08/31/2020     Patient obtains medications through Adherence Packaging  90 Days   Last adherence delivery included:  Omeprazole 48m: Take one capsule by mouth at breakfast and at bedtime.   Metformin 5022m  Take two tablets by mouth at breakfast and at bedtime.  Atorvastatin 4075mTake one tablet by mouth before breakfast.   Patient is due for next adherence delivery on: 02/22/21. Called patient and reviewed medications and coordinated delivery.  This delivery to include: Omeprazole 69m87make one capsule by mouth at breakfast and at bedtime.   Metformin 500mg64mke two tablets by mouth at breakfast and at bedtime.  Atorvastatin 69mg:64me one tablet by mouth before breakfast. Jardiance 25 mg Take 1 tablet daily   Patient declined the following medications: Metformin, patient has 4 bottles of 360 tablets each at home.  Patient needs refills for: None noted  Confirmed delivery date of 02/22/21, advised patient that pharmacy will contact them the morning of delivery.   [facility].   JessicLizbeth Barkcal Pharmacist Assistant [phone]

## 2021-02-18 ENCOUNTER — Encounter: Payer: Self-pay | Admitting: Internal Medicine

## 2021-02-18 ENCOUNTER — Telehealth: Payer: Self-pay

## 2021-02-18 ENCOUNTER — Telehealth: Payer: Self-pay | Admitting: Pharmacist

## 2021-02-18 NOTE — Telephone Encounter (Signed)
Unable to lvm to make this apt. Sent letter.  °

## 2021-02-18 NOTE — Progress Notes (Deleted)
Chronic Care Management Pharmacy  Name: [name]  MRN: [id number] DOB: 1955/11/16   Chief Complaint/ HPI  [name],  65 y.o. , male presents for his Follow-Up CCM visit with the clinical pharmacist via telephone.  PCP : Charlynne Cousins, MD Patient Care Team: Charlynne Cousins, MD as PCP - General (Internal Medicine) Vladimir Faster, [facility] as Pharmacist (Pharmacist)  Patient's chronic conditions include: Hyperlipidemia, Diabetes, GERD and Insomnia   Office Visits: 12/20/20- vigg (PCP)- add spirivia 11/21/20- Dr. Ky Barban- hospital follow-up, Pneumococcal 13 given, [facility] x 2 weeks COVID PNA hypoxia,  08/31/20- Dr. Wynetta Emery- bloodwork, sleep study referral, flu vaccine, psa  Consult Visit: 02/13/21- Posey Pronto (ortho)- meloxicam 15 mg qd, L knee injection triamcinolone, lidocaine, ropivacaine 01/23/21- fleming (pulmonolgy) spiriva, albuterol f/u cxr in 4 months 09/03/20- Dr. Rogers Blocker, Ortho   Subjective: " I'm doing much better"  Objective: No Known Allergies  Medications: Outpatient Encounter Medications as of 02/18/2021  Medication Sig Note  . albuterol (VENTOLIN HFA) 108 (90 Base) MCG/ACT inhaler Inhale 2 puffs into the lungs every 6 (six) hours as needed for wheezing or shortness of breath.   . Ascorbic Acid (VITAMIN C) 1000 MG tablet Take 1,000 mg by mouth daily.   [name] aspirin 81 MG chewable tablet Chew 81 mg by mouth every evening.   [name] atorvastatin (LIPITOR) 40 MG tablet Take 1 tablet (40 mg total) by mouth daily.   . benzonatate (TESSALON) 200 MG capsule Take 1 capsule (200 mg total) by mouth 2 (two) times daily as needed for cough.   . Blood Glucose Monitoring Suppl (ACCU-CHEK AVIVA PLUS) w/Device KIT Use kit to check sugar daily   . chlorpheniramine-HYDROcodone (TUSSIONEX) 10-8 MG/5ML SUER Take by mouth.   . Cholecalciferol (VITAMIN D-3 PO) Take 5,000 Units/day by mouth daily.    . clonazePAM (KLONOPIN) 1 MG tablet Take 1 tablet (1 mg total) by mouth daily as needed for anxiety.    . colchicine 0.6 MG tablet Take by mouth.   . Continuous Blood Gluc Receiver (FREESTYLE LIBRE 2 READER) DEVI Use to monitor blood glucose.   . Dulaglutide (TRULICITY) 2.75 TZ/0.0FV SOPN Inject once a week   . empagliflozin (JARDIANCE) 25 MG TABS tablet Take 1 tablet (25 mg total) by mouth daily.   . Melatonin 10 MG TABS Take 1 tablet by mouth. 01/03/2020: PRN  . metFORMIN (GLUCOPHAGE) 500 MG tablet TAKE TWO TABLETS BY MOUTH AT BREAKFAST AND AT BEDTIME   . Multiple Vitamins-Minerals (MULTIVITAMIN ADULT PO) Take by mouth.   . Multiple Vitamins-Minerals (VISION PLUS PO) Take by mouth.   [name] omeprazole (PRILOSEC) 40 MG capsule Take 1 capsule (40 mg total) by mouth in the morning and at bedtime.   . ONE TOUCH ULTRA TEST test strip USE TO CHECK BLOOD SUGAR LEVELS THREE TIMES DAILY. DX:E11.9   . predniSONE (DELTASONE) 10 MG tablet Take 1 tablet by mouth daily.   . sucralfate (CARAFATE) 1 g tablet Take 1 tablet (1 g total) by mouth 2 (two) times daily.   . traMADol (ULTRAM) 50 MG tablet Take 50-100 mg by mouth 4 (four) times daily as needed.   . vitamin E 400 UNIT capsule Take 400 Units by mouth daily.   . Zinc 50 MG TABS Take 50 mg by mouth daily.    No facility-administered encounter medications on file as of 02/18/2021.    Wt Readings from Last 3 Encounters:  12/20/20 165 lb 3.2 oz (74.9 kg)  11/30/20 163 lb 2.3 oz (74 kg)  11/21/20 164 lb 2 oz (74.4 kg)    Lab Results  Component Value Date   CREATININE 0.92 11/30/2020   BUN 19 11/30/2020   GFRNONAA >60 11/30/2020   GFRAA 103 08/31/2020   NA 134 (L) 11/30/2020   K 4.4 11/30/2020   CALCIUM 9.2 11/30/2020   CO2 21 (L) 11/30/2020     Current Diagnosis/Assessment:    Goals Addressed   None      Diabetes   A1c goal <7%  Recent Relevant Labs: Labs (Brief)       Lab Results  Component Value Date/Time   HGBA1C 6.5 (H) 01/19/2020 11:58 AM   HGBA1C 6.7 (H) 07/25/2019 02:09 PM   HGBA1C 6.4 09/06/2018 04:03 PM   HGBA1C  6.7 04/26/2018 04:54 PM   MICROALBUR 10 04/26/2018 02:53 PM   MICROALBUR 10 11/20/2016 08:51 AM      Last diabetic Eye exam:  Labs (Brief)       Lab Results  Component Value Date/Time   HMDIABEYEEXA No Retinopathy 03/23/2019 12:00 AM      Last diabetic Foot exam:  Labs (Brief)  No results found for: HMDIABFOOTEX   BMP Latest Ref Rng & Units 11/30/2020 08/31/2020 01/19/2020  Glucose 70 - 99 mg/dL 276(H) 107(H) 110(H)  BUN 8 - 23 mg/dL '19 21 20  ' Creatinine 0.61 - 1.24 mg/dL 0.92 0.89 1.10  BUN/Creat Ratio 10 - 24 - 24 18  Sodium 135 - 145 mmol/L 134(L) 138 140  Potassium 3.5 - 5.1 mmol/L 4.4 4.2 4.2  Chloride 98 - 111 mmol/L 102 101 100  CO2 22 - 32 mmol/L 21(L) 26 24  Calcium 8.9 - 10.3 mg/dL 9.2 9.7 9.3      Checking BG: Four times weekly  Recent FBG Readings: 124, 128, 132, 156 this morning Recent postprandials BG readings: 200, 279 when he had dessert  Patient has failed these meds in past: NA Patient is currently controlled on the following medications:  Jardiance 25 mg daily  Trulicity 0.30 mg weekly  Metformin 1000 mg qam 500 mg qpm  We discussed: Patient and spouse have a new part D plan which covers diabetic medications with zero copay. His PAP renewals were previously sent to Endo office for signature and submission.  He follows with [name] at  Endocrinology.He plans to discuss a CGM monitor at upcoming visit and has been using Ellen's meter in the interim. He denies any symptoms or hypoglycemia or low readings. He is slowly becoming more active after bout with Covid and walked with his grandson and his [place] this week.He walked to the mailbox yesterday and inside the house.    He eats mostly a low-carb diet and avoids sugary drinks most of the time. He reports taking an additional 1/4-1/2 tablet of metformin if he indulges in dessert. He states this works well for him and his sugar does not go above 200. He reports self- titrating his  metformin to 500 mg every evening due to lows overnight.  Plan Continue current regimen.  Hyperlipidemia   LDL goal < 70  Last lipids Lab Results  Component Value Date   CHOL 169 08/31/2020   HDL 49 08/31/2020   LDLCALC 104 (H) 08/31/2020   TRIG 84 08/31/2020   CHOLHDL 3.7 01/06/2018   Hepatic Function Latest Ref Rng & Units 08/31/2020 01/19/2020 07/25/2019  Total Protein 6.0 - 8.5 g/dL 7.0 7.0 6.6  Albumin 3.8 - 4.8 g/dL 4.7 4.5 4.3  AST 0 - 40 IU/L 15 14  15  ALT 0 - 44 IU/L '18 17 17  ' Alk Phosphatase 44 - 121 IU/L 77 76 82  Total Bilirubin 0.0 - 1.2 mg/dL 0.5 0.3 0.4     The 10-year ASCVD risk score Mikey Bussing DC Jr., et al., 2013) is: 24.9%   Values used to calculate the score:     Age: 37 years     Sex: Male     Is Non-Hispanic African American: No     Diabetic: Yes     Tobacco smoker: No     Systolic Blood Pressure: 846 mmHg     Is BP treated: No     HDL Cholesterol: 49 mg/dL     Total Cholesterol: 169 mg/dL   Patient has failed these meds in past: NA Patient is currently uncontrolled on the following medications:  . Aspirin 81 ec mg qd . Atorvastatin 40 mg qd  We discussed:  Diet and exercise. Could consider more stringent goal of ld l< 70 and change to rosuvastatin 28m qd  Plan  Continue current medications and control with diet and exercise. Consider changing to 40 mg rosuvastatin for ldl reduction.  GERD?   Patient has failed these meds in past: NA Patient is currently controlled on the following medications:  . Omeprazole 40 mg bid  . Sucralfate 1-2 tabs qd prn  We discussed:  Patient reports symptoms well controlled. Only intermittent breakthrough with dietary indescretion. He follows with GI.  Plan  Continue current medications and control with diet and exercise. Recommends adding GERD diagnosis to patient problem list.     Vaccines   Reviewed and discussed patient's vaccination history.    Immunization History  Administered Date(s)  Administered  . Fluad Quad(high Dose 65+) 09/07/2019, 08/31/2020  . Influenza,inj,Quad PF,6+ Mos 07/29/2016, 06/29/2017  . Influenza-Unspecified 07/20/2015, 07/29/2016, 06/29/2017, 07/20/2018  . Pneumococcal Conjugate-13 11/21/2020  . Pneumococcal-Unspecified 01/19/1999, 04/17/2005  . Td 01/22/2009  . Zoster 11/20/2016    Plan  Recommended patient receive COVID and Shingrix vaccines.    Medication Management   Patient's preferred pharmacy is:  [id number] [id number]: [phone]: [phone] [place] [address] [id number]: [phone]: [phone] pill box? No - compliance packaging Pt endorses 95% compliance  We discussed: Patient uses [facility] med sync, packaging and delivery.  Plan  Continue current medication management strategy    Follow up: 3 month phone visit  JuJunita PushHaKenton KingfisherharmD, BCDanaeBon Secours Surgery [phone]

## 2021-02-21 ENCOUNTER — Ambulatory Visit: Payer: HMO | Admitting: Internal Medicine

## 2021-03-15 ENCOUNTER — Telehealth: Payer: Self-pay | Admitting: Pharmacist

## 2021-03-15 NOTE — Chronic Care Management (AMB) (Signed)
  Chronic Care Management Pharmacy Assistant   Name: Paxon L Maalouf  MRN: [id number] DOB: 06/23/1956    Reason for Encounter: Medication Review/Medication coordination call    Recent office visits:  None  Recent consult visits:  02/13/21-Sunny Patel MD (Ortho)- Referral to physical therapy and occupational therapy for knee pain.  Corticosteroid injection given.  Started Meloxicam 15mg take 1 tablet daily  Hospital visits:  None in previous 6 months  Medications: Outpatient Encounter Medications as of 03/15/2021  Medication Sig Note  . albuterol (VENTOLIN HFA) 108 (90 Base) MCG/ACT inhaler Inhale 2 puffs into the lungs every 6 (six) hours as needed for wheezing or shortness of breath.   . Ascorbic Acid (VITAMIN C) 1000 MG tablet Take 1,000 mg by mouth daily.   . aspirin 81 MG chewable tablet Chew 81 mg by mouth every evening.   . atorvastatin (LIPITOR) 40 MG tablet Take 1 tablet (40 mg total) by mouth daily.   . benzonatate (TESSALON) 200 MG capsule Take 1 capsule (200 mg total) by mouth 2 (two) times daily as needed for cough.   . Blood Glucose Monitoring Suppl (ACCU-CHEK AVIVA PLUS) w/Device KIT Use kit to check sugar daily   . chlorpheniramine-HYDROcodone (TUSSIONEX) 10-8 MG/5ML SUER Take by mouth.   . Cholecalciferol (VITAMIN D-3 PO) Take 5,000 Units/day by mouth daily.    . clonazePAM (KLONOPIN) 1 MG tablet Take 1 tablet (1 mg total) by mouth daily as needed for anxiety.   . colchicine 0.6 MG tablet Take by mouth.   . Continuous Blood Gluc Receiver (FREESTYLE LIBRE 2 READER) DEVI Use to monitor blood glucose.   . Dulaglutide (TRULICITY) 0.75 MG/0.5ML SOPN Inject once a week   . JARDIANCE 25 MG TABS tablet TAKE ONE TABLET BY MOUTH EVERY MORNING   . Melatonin 10 MG TABS Take 1 tablet by mouth. 01/03/2020: PRN  . metFORMIN (GLUCOPHAGE) 500 MG tablet TAKE TWO TABLETS BY MOUTH AT BREAKFAST AND AT BEDTIME   . Multiple Vitamins-Minerals (MULTIVITAMIN ADULT PO) Take by mouth.   .  Multiple Vitamins-Minerals (VISION PLUS PO) Take by mouth.   . omeprazole (PRILOSEC) 40 MG capsule Take 1 capsule (40 mg total) by mouth in the morning and at bedtime.   . ONE TOUCH ULTRA TEST test strip USE TO CHECK BLOOD SUGAR LEVELS THREE TIMES DAILY. DX:E11.9   . predniSONE (DELTASONE) 10 MG tablet Take 1 tablet by mouth daily.   . sucralfate (CARAFATE) 1 g tablet Take 1 tablet (1 g total) by mouth 2 (two) times daily.   . traMADol (ULTRAM) 50 MG tablet Take 50-100 mg by mouth 4 (four) times daily as needed.   . vitamin E 400 UNIT capsule Take 400 Units by mouth daily.   . Zinc 50 MG TABS Take 50 mg by mouth daily.    No facility-administered encounter medications on file as of 03/15/2021.    Reviewed chart for medication changes ahead of medication coordination call.  No OVs, Consults, or hospital visits since last care coordination call/Pharmacist visit. (If appropriate, list visit date, provider name)  No medication changes indicated OR if recent visit, treatment plan here.  BP Readings from Last 3 Encounters:  11/30/20 123/71  11/21/20 135/77  10/16/20 137/63    Lab Results  Component Value Date   HGBA1C 6.9 08/31/2020     Patient obtains medications through Vials  90 Days   Last adherence delivery included: Omeprazole 40mg: Take one capsule by mouth at breakfast and at bedtime.     Atorvastatin 66m: Take one tablet by mouth before breakfast. Jardiance 25 mg Take 1 tablet daily  Patient declined Metofmin last month due to additional supply on hand. Explanation of abundance on hand: patient stated he had 4 bottles of 360 tablets each.  Patient is due for next adherence delivery on: 06./02/22 Called patient and reviewed medications and coordinated delivery.  This delivery to include: Jardiance 259mtake 1 tablet daily and Metformin 50024make 2 am and 1 pm   Patient declined the following medications (Atorvastatin and Omeprazole due to (sufficient  quantity)  Patient needs refills for none.  Confirmed delivery date of 03/21/21,  advised patient that pharmacy will contact them the morning of delivery.  JesLizbeth Barkinical Pharmacist Assistant [phone]

## 2021-04-05 DIAGNOSIS — J4520 Mild intermittent asthma, uncomplicated: Secondary | ICD-10-CM | POA: Insufficient documentation

## 2021-04-05 DIAGNOSIS — Z0000 Encounter for general adult medical examination without abnormal findings: Secondary | ICD-10-CM | POA: Diagnosis not present

## 2021-04-05 DIAGNOSIS — E118 Type 2 diabetes mellitus with unspecified complications: Secondary | ICD-10-CM | POA: Diagnosis not present

## 2021-04-05 DIAGNOSIS — I700 Atherosclerosis of aorta: Secondary | ICD-10-CM | POA: Diagnosis not present

## 2021-04-05 DIAGNOSIS — Z23 Encounter for immunization: Secondary | ICD-10-CM | POA: Diagnosis not present

## 2021-04-05 DIAGNOSIS — E119 Type 2 diabetes mellitus without complications: Secondary | ICD-10-CM | POA: Diagnosis not present

## 2021-04-05 DIAGNOSIS — E1169 Type 2 diabetes mellitus with other specified complication: Secondary | ICD-10-CM | POA: Diagnosis not present

## 2021-04-05 DIAGNOSIS — E785 Hyperlipidemia, unspecified: Secondary | ICD-10-CM | POA: Diagnosis not present

## 2021-04-10 ENCOUNTER — Telehealth: Payer: Self-pay | Admitting: Pharmacist

## 2021-04-10 NOTE — Chronic Care Management (AMB) (Signed)
Chronic Care Management Pharmacy Assistant   Name: [name]  MRN: [id number] DOB: [birth year]   Reason for Encounter: Medication Review/Medication coordination call    Medications: Outpatient Encounter Medications as of 04/10/2021  Medication Sig Note   albuterol (VENTOLIN HFA) 108 (90 Base) MCG/ACT inhaler Inhale 2 puffs into the lungs every 6 (six) hours as needed for wheezing or shortness of breath.    Ascorbic Acid (VITAMIN C) 1000 MG tablet Take 1,000 mg by mouth daily.    aspirin 81 MG chewable tablet Chew 81 mg by mouth every evening.    atorvastatin (LIPITOR) 40 MG tablet Take 1 tablet (40 mg total) by mouth daily.    benzonatate (TESSALON) 200 MG capsule Take 1 capsule (200 mg total) by mouth 2 (two) times daily as needed for cough.    Blood Glucose Monitoring Suppl (ACCU-CHEK AVIVA PLUS) w/Device KIT Use kit to check sugar daily    chlorpheniramine-HYDROcodone (TUSSIONEX) 10-8 MG/5ML SUER Take by mouth.    Cholecalciferol (VITAMIN D-3 PO) Take 5,000 Units/day by mouth daily.     clonazePAM (KLONOPIN) 1 MG tablet Take 1 tablet (1 mg total) by mouth daily as needed for anxiety.    colchicine 0.6 MG tablet Take by mouth.    Continuous Blood Gluc Receiver (FREESTYLE LIBRE 2 READER) DEVI Use to monitor blood glucose.    Dulaglutide (TRULICITY) 8.88 KC/0.0LK SOPN Inject once a week    JARDIANCE 25 MG TABS tablet TAKE ONE TABLET BY MOUTH EVERY MORNING    Melatonin 10 MG TABS Take 1 tablet by mouth. 01/03/2020: PRN   metFORMIN (GLUCOPHAGE) 500 MG tablet TAKE TWO TABLETS BY MOUTH AT BREAKFAST AND AT BEDTIME    Multiple Vitamins-Minerals (MULTIVITAMIN ADULT PO) Take by mouth.    Multiple Vitamins-Minerals (VISION PLUS PO) Take by mouth.    omeprazole (PRILOSEC) 40 MG capsule Take 1 capsule (40 mg total) by mouth in the morning and at bedtime.    ONE TOUCH ULTRA TEST test strip USE TO CHECK BLOOD SUGAR LEVELS THREE TIMES DAILY. DX:E11.9    predniSONE (DELTASONE) 10 MG tablet Take  1 tablet by mouth daily.    sucralfate (CARAFATE) 1 g tablet Take 1 tablet (1 g total) by mouth 2 (two) times daily.    traMADol (ULTRAM) 50 MG tablet Take 50-100 mg by mouth 4 (four) times daily as needed.    vitamin E 400 UNIT capsule Take 400 Units by mouth daily.    Zinc 50 MG TABS Take 50 mg by mouth daily.    No facility-administered encounter medications on file as of 04/10/2021.    Reviewed chart for medication changes ahead of medication coordination call.  No OVs, Consults, or hospital visits since last care coordination call/Pharmacist visit. (If appropriate, list visit date, provider name)  No medication changes indicated OR if recent visit, treatment plan here.  BP Readings from Last 3 Encounters:  11/30/20 123/71  11/21/20 135/77  10/16/20 137/63    Lab Results  Component Value Date   HGBA1C 6.9 08/31/2020     Patient obtains medications through Vials   53 DS    Last adherence delivery included:  Jardiance 31m take 1 tablet daily  Metformin 5077mtake 2 am and 1 pm  Patient declined Atorvastatin and Omeprazole last month due to sufficient on hand.  Patient is due for next adherence delivery on: 04/19/21. Called patient and reviewed medications and coordinated delivery.  This delivery to include: Jardiance 25 mg 1 tab at breakfast  Metformin 500 mg 3 tabs daily two at breakfast 1 at bedtime  Confirmed delivery date of 04/19/21 afternoon route, advised patient that pharmacy will contact them the morning of delivery.   Corrie Mckusick, [place]

## 2021-04-11 ENCOUNTER — Telehealth: Payer: Self-pay | Admitting: Pharmacist

## 2021-04-17 ENCOUNTER — Other Ambulatory Visit: Payer: Self-pay | Admitting: Internal Medicine

## 2021-04-17 DIAGNOSIS — E1169 Type 2 diabetes mellitus with other specified complication: Secondary | ICD-10-CM

## 2021-04-17 NOTE — Telephone Encounter (Signed)
Pt needs apt will call to see if pt is still a pt here at [facility] Unable to lvm to ask if still pt here or schedule apt

## 2021-04-17 NOTE — Telephone Encounter (Signed)
}  Notes to clinic:  Patient has different pcp listed  Review for refill   Requested Prescriptions  Pending Prescriptions Disp Refills   metFORMIN (GLUCOPHAGE) 500 MG tablet [Pharmacy Med Name: metformin 500 mg tablet] 120 tablet 0    Sig: TAKE TWO TABLETS BY MOUTH WITH BREAKFAST and TAKE ONE TABLET EVERYDAY AT BEDTIME      Endocrinology:  Diabetes - Biguanides Failed - 04/17/2021 10:36 AM      Failed - HBA1C is between 0 and 7.9 and within 180 days    HB A1C (BAYER DCA - WAIVED)  Date Value Ref Range Status  08/31/2020 6.9 <7.0 % Final    Comment:                                          Diabetic Adult            <7.0                                       Healthy Adult        4.3 - 5.7                                                           (DCCT/NGSP) American Diabetes Association's Summary of Glycemic Recommendations for Adults with Diabetes: Hemoglobin A1c <7.0%. More stringent glycemic goals (A1c <6.0%) may further reduce complications at the cost of increased risk of hypoglycemia.           Passed - Cr in normal range and within 360 days    Creatinine  Date Value Ref Range Status  12/07/2012 1.11 0.60 - 1.30 mg/dL Final   Creatinine, Ser  Date Value Ref Range Status  11/30/2020 0.92 0.61 - 1.24 mg/dL Final          Passed - eGFR in normal range and within 360 days    EGFR (African American)  Date Value Ref Range Status  12/07/2012 >60  Final   GFR calc Af Amer  Date Value Ref Range Status  08/31/2020 103 >59 mL/min/1.73 Final    Comment:    **In accordance with recommendations from the NKF-ASN Task force,**   [facility] is in the process of updating its eGFR calculation to the   2021 CKD-EPI creatinine equation that estimates kidney function   without a race variable.    EGFR (Non-African Amer.)  Date Value Ref Range Status  12/07/2012 >60  Final    Comment:    eGFR values <47m/min/1.73 m2 may be an indication of chronic kidney disease  (CKD). Calculated eGFR is useful in patients with stable renal function. The eGFR calculation will not be reliable in acutely ill patients when serum creatinine is changing rapidly. It is not useful in  patients on dialysis. The eGFR calculation may not be applicable to patients at the low and high extremes of body sizes, pregnant women, and vegetarians.    GFR, Estimated  Date Value Ref Range Status  11/30/2020 >60 >60 mL/min Final    Comment:    (NOTE) Calculated using the CKD-EPI Creatinine Equation (2021)  Passed - Valid encounter within last 6 months    Recent Outpatient Visits           3 months ago Type 1 diabetes mellitus without complications ([place])   [place] Vigg, Avanti, MD   4 months ago [place], Jake Church, DO   6 months ago COVID-67   [facility] Myles Gip, DO   7 months ago Diabetes mellitus associated with hormonal etiology [facility])   [place], Megan P, DO   1 year ago Diabetes mellitus associated with hormonal etiology ([place])   [place], [place], [place]       Future Appointments             In 7 months [place], PEC                JARDIANCE 25 MG TABS tablet [Pharmacy Med Name: Jardiance 25 mg tablet] 30 tablet 0    Sig: TAKE ONE TABLET BY MOUTH EVERY MORNING      Endocrinology:  Diabetes - SGLT2 Inhibitors Failed - 04/17/2021 10:36 AM      Failed - LDL in normal range and within 360 days    LDL Chol Calc (NIH)  Date Value Ref Range Status  08/31/2020 104 (H) 0 - 99 mg/dL Final          Failed - HBA1C is between 0 and 7.9 and within 180 days    HB A1C (BAYER DCA - WAIVED)  Date Value Ref Range Status  08/31/2020 6.9 <7.0 % Final    Comment:                                          Diabetic Adult            <7.0                                       Healthy Adult        4.3 - 5.7                                                            (DCCT/NGSP) American Diabetes Association's Summary of Glycemic Recommendations for Adults with Diabetes: Hemoglobin A1c <7.0%. More stringent glycemic goals (A1c <6.0%) may further reduce complications at the cost of increased risk of hypoglycemia.           Passed - Cr in normal range and within 360 days    Creatinine  Date Value Ref Range Status  12/07/2012 1.11 0.60 - 1.30 mg/dL Final   Creatinine, Ser  Date Value Ref Range Status  11/30/2020 0.92 0.61 - 1.24 mg/dL Final          Passed - eGFR in normal range and within 360 days    EGFR (African American)  Date Value Ref Range Status  12/07/2012 >60  Final   GFR calc Af Amer  Date Value Ref Range Status  08/31/2020 103 >59 mL/min/1.73 Final    Comment:    **In accordance with recommendations from  the NKF-ASN Task force,**   [facility] is in the process of updating its eGFR calculation to the   2021 CKD-EPI creatinine equation that estimates kidney function   without a race variable.    EGFR (Non-African Amer.)  Date Value Ref Range Status  12/07/2012 >60  Final    Comment:    eGFR values <55m/min/1.73 m2 may be an indication of chronic kidney disease (CKD). Calculated eGFR is useful in patients with stable renal function. The eGFR calculation will not be reliable in acutely ill patients when serum creatinine is changing rapidly. It is not useful in  patients on dialysis. The eGFR calculation may not be applicable to patients at the low and high extremes of body sizes, pregnant women, and vegetarians.    GFR, Estimated  Date Value Ref Range Status  11/30/2020 >60 >60 mL/min Final    Comment:    (NOTE) Calculated using the CKD-EPI Creatinine Equation (2021)           Passed - Valid encounter within last 6 months    Recent Outpatient Visits           3 months ago Type 1 diabetes mellitus without complications ([place]   [place], [place]   4  months ago [place], DO   6 months ago CCOVID-30  [place] DO   7 months ago Diabetes mellitus associated with hormonal etiology ([facility]   [place] P, DO   1 year ago Diabetes mellitus associated with hormonal etiology ([facility]   [place]      Future Appointments             [facility]

## 2021-05-07 NOTE — Telephone Encounter (Signed)
Pt needs apt will call to see if pt is still a pt here at [facility] Unable to lvm to ask if still pt here or schedule apt  

## 2021-05-08 ENCOUNTER — Encounter: Payer: Self-pay | Admitting: Internal Medicine

## 2021-05-08 NOTE — Telephone Encounter (Signed)
3rd attempt made no answer Pt needs apt will call to see if pt is still a pt here at [facility] Unable to lvm to ask if still pt here or schedule apt Sent letter

## 2021-05-10 ENCOUNTER — Other Ambulatory Visit: Payer: Self-pay | Admitting: Family Medicine

## 2021-05-10 DIAGNOSIS — E7800 Pure hypercholesterolemia, unspecified: Secondary | ICD-10-CM

## 2021-05-10 NOTE — Telephone Encounter (Signed)
Requested medication (s) are due for refill today: Yes  Requested medication (s) are on the active medication list: Yes  Last refill:  08/31/20  Future visit scheduled: No  Notes to clinic:  Is pt. Still seen at practice?    Requested Prescriptions  Pending Prescriptions Disp Refills   omeprazole (PRILOSEC) 40 MG capsule [Pharmacy Med Name: omeprazole 40 mg capsule,delayed release] 180 capsule 1    Sig: TAKE ONE CAPSULE BY MOUTH AT BREAKFAST AND AT BEDTIME      Gastroenterology: Proton Pump Inhibitors Passed - 05/10/2021  8:02 AM      Passed - Valid encounter within last 12 months    Recent Outpatient Visits           4 months ago Type 1 diabetes mellitus without complications (HCC)   [facility] Vigg, Avanti, MD   5 months ago COVID-19   [facility], Darl Householder, DO   6 months ago COVID-19   [facility] [place] Dense M, DO   8 months ago Diabetes mellitus associated with hormonal etiology [facility])   [facility] [place], Megan P, DO   1 year ago Diabetes mellitus associated with hormonal etiology (HCC)   [facility] Particia Nearing, [place]       Future Appointments             In 6 months [facility], PEC                atorvastatin (LIPITOR) 40 MG tablet [Pharmacy Med Name: atorvastatin 40 mg tablet] 90 tablet 1    Sig: TAKE ONE TABLET BY MOUTH EVERYDAY AT BEDTIME      Cardiovascular:  Antilipid - Statins Failed - 05/10/2021  8:02 AM      Failed - LDL in normal range and within 360 days    LDL Chol Calc (NIH)  Date Value Ref Range Status  08/31/2020 104 (H) 0 - 99 mg/dL Final          Passed - Total Cholesterol in normal range and within 360 days    Cholesterol, Total  Date Value Ref Range Status  08/31/2020 169 100 - 199 mg/dL Final   Cholesterol Piccolo, Waived  Date Value Ref Range Status  09/06/2018 132 <200 mg/dL Final    Comment:                             Desirable                <200                         Borderline High      200- 239                         High                     >239           Passed - HDL in normal range and within 360 days    HDL  Date Value Ref Range Status  08/31/2020 49 >39 mg/dL Final          Passed - Triglycerides in normal range and within 360 days    Triglycerides  Date Value Ref Range Status  08/31/2020 84 0 - 149 mg/dL Final   Triglycerides Piccolo,Waived  Date Value Ref Range Status  09/06/2018 202 (H) <150 mg/dL Final    Comment:                            Normal                   <150                         Borderline High     150 - 199                         High                200 - 499                         Very High                >499           Passed - Patient is not pregnant      Passed - Valid encounter within last 12 months    Recent Outpatient Visits           4 months ago Type 1 diabetes mellitus without complications (HCC)   [facility] Vigg, Avanti, MD   5 months ago COVID-19   [facility] Caro Laroche, DO   6 months ago COVID-19   [facility] [place] Dense M, DO   8 months ago Diabetes mellitus associated with hormonal etiology [facility])   [facility] [place], Megan P, DO   1 year ago Diabetes mellitus associated with hormonal etiology [facility])   [facility] Particia Nearing, [place]       Future Appointments             In 6 months [facility], PEC

## 2021-05-13 ENCOUNTER — Other Ambulatory Visit: Payer: Self-pay | Admitting: Internal Medicine

## 2021-05-13 DIAGNOSIS — E1169 Type 2 diabetes mellitus with other specified complication: Secondary | ICD-10-CM

## 2021-05-13 NOTE — Telephone Encounter (Signed)
Notes to clinic:  Review for refill  Is patient still with practice    Requested Prescriptions  Pending Prescriptions Disp Refills   JARDIANCE 25 MG TABS tablet [Pharmacy Med Name: Jardiance 25 mg tablet] 53 tablet 0    Sig: TAKE ONE TABLET BY MOUTH EVERY MORNING      Endocrinology:  Diabetes - SGLT2 Inhibitors Failed - 05/13/2021 10:38 AM      Failed - LDL in normal range and within 360 days    LDL Chol Calc (NIH)  Date Value Ref Range Status  08/31/2020 104 (H) 0 - 99 mg/dL Final          Failed - HBA1C is between 0 and 7.9 and within 180 days    HB A1C (BAYER DCA - WAIVED)  Date Value Ref Range Status  08/31/2020 6.9 <7.0 % Final    Comment:                                          Diabetic Adult            <7.0                                       Healthy Adult        4.3 - 5.7                                                           (DCCT/NGSP) American Diabetes Association's Summary of Glycemic Recommendations for Adults with Diabetes: Hemoglobin A1c <7.0%. More stringent glycemic goals (A1c <6.0%) may further reduce complications at the cost of increased risk of hypoglycemia.           Passed - Cr in normal range and within 360 days    Creatinine  Date Value Ref Range Status  12/07/2012 1.11 0.60 - 1.30 mg/dL Final   Creatinine, Ser  Date Value Ref Range Status  11/30/2020 0.92 0.61 - 1.24 mg/dL Final          Passed - eGFR in normal range and within 360 days    EGFR (African American)  Date Value Ref Range Status  12/07/2012 >60  Final   GFR calc Af Amer  Date Value Ref Range Status  08/31/2020 103 >59 mL/min/1.73 Final    Comment:    **In accordance with recommendations from the NKF-ASN Task force,**   [facility] is in the process of updating its eGFR calculation to the   2021 CKD-EPI creatinine equation that estimates kidney function   without a race variable.    EGFR (Non-African Amer.)  Date Value Ref Range Status  12/07/2012 >60  Final     Comment:    eGFR values <57m/min/1.73 m2 may be an indication of chronic kidney disease (CKD). Calculated eGFR is useful in patients with stable renal function. The eGFR calculation will not be reliable in acutely ill patients when serum creatinine is changing rapidly. It is not useful in  patients on dialysis. The eGFR calculation may not be applicable to patients at the low and high extremes of body sizes, pregnant women, and vegetarians.  GFR, Estimated  Date Value Ref Range Status  11/30/2020 >60 >60 mL/min Final    Comment:    (NOTE) Calculated using the CKD-EPI Creatinine Equation (2021)           Passed - Valid encounter within last 6 months    Recent Outpatient Visits           4 months ago Type 1 diabetes mellitus without complications ([place])   [place] Vigg, Avanti, MD   5 months ago [place], Alison M, DO   6 months ago COVID-64   [facility] Rory Percy M, DO   8 months ago Diabetes mellitus associated with hormonal etiology [facility])   [place], Megan P, DO   1 year ago Diabetes mellitus associated with hormonal etiology ([place])   [place], [place], [place]       Future Appointments             In 6 months [place], [facility]                metFORMIN (GLUCOPHAGE) 500 MG tablet [Pharmacy Med Name: metformin 500 mg tablet] 159 tablet 0    Sig: TAKE TWO TABLETS BY MOUTH WITH BREAKFAST and TAKE ONE TABLET EVERYDAY AT BEDTIME      Endocrinology:  Diabetes - Biguanides Failed - 05/13/2021 10:38 AM      Failed - HBA1C is between 0 and 7.9 and within 180 days    HB A1C (BAYER DCA - WAIVED)  Date Value Ref Range Status  08/31/2020 6.9 <7.0 % Final    Comment:                                          Diabetic Adult            <7.0                                       Healthy Adult        4.3 - 5.7                                                            (DCCT/NGSP) American Diabetes Association's Summary of Glycemic Recommendations for Adults with Diabetes: Hemoglobin A1c <7.0%. More stringent glycemic goals (A1c <6.0%) may further reduce complications at the cost of increased risk of hypoglycemia.           Passed - Cr in normal range and within 360 days    Creatinine  Date Value Ref Range Status  12/07/2012 1.11 0.60 - 1.30 mg/dL Final   Creatinine, Ser  Date Value Ref Range Status  11/30/2020 0.92 0.61 - 1.24 mg/dL Final          Passed - eGFR in normal range and within 360 days    EGFR (African American)  Date Value Ref Range Status  12/07/2012 >60  Final   GFR calc Af Amer  Date Value Ref Range Status  08/31/2020 103 >59 mL/min/1.73 Final    Comment:    **In  accordance with recommendations from the NKF-ASN Task force,**   [facility] is in the process of updating its eGFR calculation to the   2021 CKD-EPI creatinine equation that estimates kidney function   without a race variable.    EGFR (Non-African Amer.)  Date Value Ref Range Status  12/07/2012 >60  Final    Comment:    eGFR values <66m/min/1.73 m2 may be an indication of chronic kidney disease (CKD). Calculated eGFR is useful in patients with stable renal function. The eGFR calculation will not be reliable in acutely ill patients when serum creatinine is changing rapidly. It is not useful in  patients on dialysis. The eGFR calculation may not be applicable to patients at the low and high extremes of body sizes, pregnant women, and vegetarians.    GFR, Estimated  Date Value Ref Range Status  11/30/2020 >60 >60 mL/min Final    Comment:    (NOTE) Calculated using the CKD-EPI Creatinine Equation (2021)           Passed - Valid encounter within last 6 months    Recent Outpatient Visits           4 months ago Type 1 diabetes mellitus without complications ([place]   [place], A[place], MD   5 months ago  [facility] [facility]ison M, DO   6 months ago CCOVID-47  [facility]   8 months ago Diabetes mellitus associated with hormonal etiology ([facility]   [place] P, DO   1 year ago Diabetes mellitus associated with hormonal etiology ([facility]   [place]      Future Appointments             [facility]

## 2021-05-14 NOTE — Telephone Encounter (Signed)
Multiple attempts have been made to contact pt to see if we are still his PCP letter has been prev sent

## 2021-05-15 NOTE — Telephone Encounter (Signed)
Pt is no longer a pt at [facility] practice.

## 2021-05-20 DIAGNOSIS — E785 Hyperlipidemia, unspecified: Secondary | ICD-10-CM | POA: Diagnosis not present

## 2021-05-20 DIAGNOSIS — I700 Atherosclerosis of aorta: Secondary | ICD-10-CM | POA: Diagnosis not present

## 2021-05-20 DIAGNOSIS — J4520 Mild intermittent asthma, uncomplicated: Secondary | ICD-10-CM | POA: Diagnosis not present

## 2021-05-20 DIAGNOSIS — E1169 Type 2 diabetes mellitus with other specified complication: Secondary | ICD-10-CM | POA: Diagnosis not present

## 2021-05-27 DIAGNOSIS — E785 Hyperlipidemia, unspecified: Secondary | ICD-10-CM | POA: Diagnosis not present

## 2021-05-27 DIAGNOSIS — E1169 Type 2 diabetes mellitus with other specified complication: Secondary | ICD-10-CM | POA: Diagnosis not present

## 2021-06-21 DIAGNOSIS — I700 Atherosclerosis of aorta: Secondary | ICD-10-CM | POA: Diagnosis not present

## 2021-06-21 DIAGNOSIS — E119 Type 2 diabetes mellitus without complications: Secondary | ICD-10-CM | POA: Diagnosis not present

## 2021-06-21 DIAGNOSIS — E118 Type 2 diabetes mellitus with unspecified complications: Secondary | ICD-10-CM | POA: Diagnosis not present

## 2021-06-21 DIAGNOSIS — E785 Hyperlipidemia, unspecified: Secondary | ICD-10-CM | POA: Diagnosis not present

## 2021-06-21 DIAGNOSIS — E1169 Type 2 diabetes mellitus with other specified complication: Secondary | ICD-10-CM | POA: Diagnosis not present

## 2021-06-21 DIAGNOSIS — Z0000 Encounter for general adult medical examination without abnormal findings: Secondary | ICD-10-CM | POA: Diagnosis not present

## 2021-07-02 DIAGNOSIS — E785 Hyperlipidemia, unspecified: Secondary | ICD-10-CM | POA: Diagnosis not present

## 2021-07-02 DIAGNOSIS — E538 Deficiency of other specified B group vitamins: Secondary | ICD-10-CM | POA: Diagnosis not present

## 2021-07-02 DIAGNOSIS — E1169 Type 2 diabetes mellitus with other specified complication: Secondary | ICD-10-CM | POA: Diagnosis not present

## 2021-07-02 DIAGNOSIS — E118 Type 2 diabetes mellitus with unspecified complications: Secondary | ICD-10-CM | POA: Diagnosis not present

## 2021-07-02 DIAGNOSIS — Z125 Encounter for screening for malignant neoplasm of prostate: Secondary | ICD-10-CM | POA: Diagnosis not present

## 2021-07-02 DIAGNOSIS — Z23 Encounter for immunization: Secondary | ICD-10-CM | POA: Diagnosis not present

## 2021-07-11 DIAGNOSIS — E785 Hyperlipidemia, unspecified: Secondary | ICD-10-CM | POA: Diagnosis not present

## 2021-07-11 DIAGNOSIS — E1169 Type 2 diabetes mellitus with other specified complication: Secondary | ICD-10-CM | POA: Diagnosis not present

## 2021-07-11 DIAGNOSIS — I700 Atherosclerosis of aorta: Secondary | ICD-10-CM | POA: Diagnosis not present

## 2021-07-11 DIAGNOSIS — J4520 Mild intermittent asthma, uncomplicated: Secondary | ICD-10-CM | POA: Diagnosis not present

## 2021-07-23 DIAGNOSIS — Z23 Encounter for immunization: Secondary | ICD-10-CM | POA: Diagnosis not present

## 2021-07-23 DIAGNOSIS — U071 COVID-19: Secondary | ICD-10-CM | POA: Diagnosis not present

## 2021-07-23 DIAGNOSIS — J208 Acute bronchitis due to other specified organisms: Secondary | ICD-10-CM | POA: Diagnosis not present

## 2021-07-23 DIAGNOSIS — E118 Type 2 diabetes mellitus with unspecified complications: Secondary | ICD-10-CM | POA: Diagnosis not present

## 2021-07-23 DIAGNOSIS — Z8616 Personal history of COVID-19: Secondary | ICD-10-CM | POA: Insufficient documentation

## 2021-08-24 IMAGING — MR MR KNEE*L* W/O CM
6 series · 40 of 40 positions shown · non-contrast
Comparison: MRI 09/23/2013

CLINICAL DATA: Anterior left knee pain. History of left knee
surgery 6 years ago with partial medial meniscectomy

EXAM:
MRI OF THE LEFT KNEE WITHOUT CONTRAST
TECHNIQUE: Multiplanar, multisequence MR imaging of the knee was performed. No
intravenous contrast was administered.

[Series 8: T2 fat-sat · axial · left · 4.0mm · 0.50mm/px · z∈[-93,+31]mm · 6 of 26 slices shown (1 of 3)]
[im 1/26]
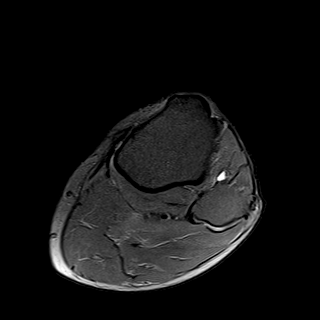
[im 6/26]
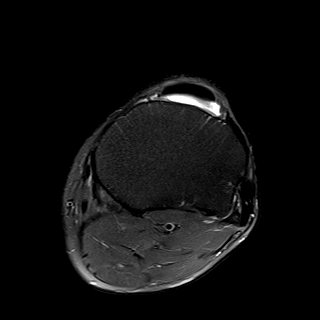
[im 11/26]
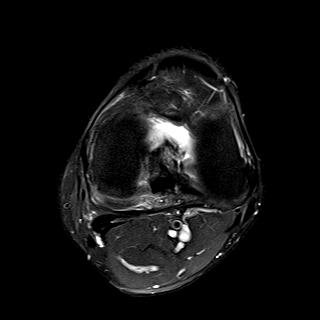
[im 16/26]
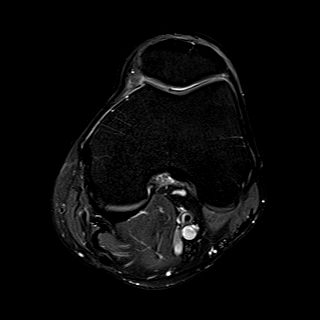
[im 21/26]
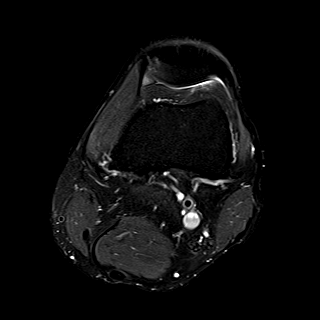
[im 26/26]
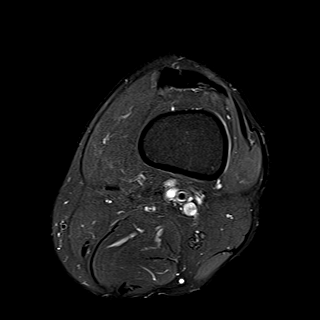

[Series 9: T2 fat-sat · coronal · left · 4.0mm · 0.59mm/px · 6 of 25 slices shown (2 of 3)]
[im 1/25]
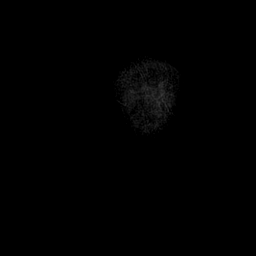
[im 5/25]
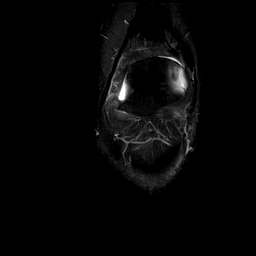
[im 10/25]
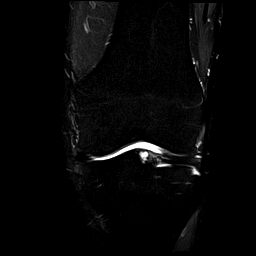
[im 15/25]
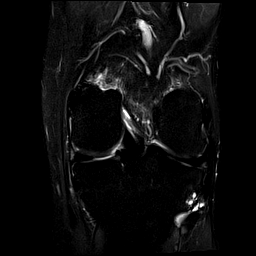
[im 20/25]
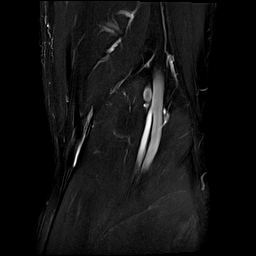
[im 25/25]
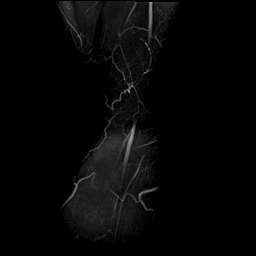

[Series 10: T1 · coronal · left · 4.0mm · 0.59mm/px · 7 of 26 slices shown]
[im 1/26]
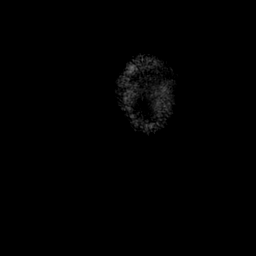
[im 5/26]
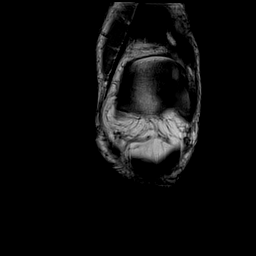
[im 9/26]
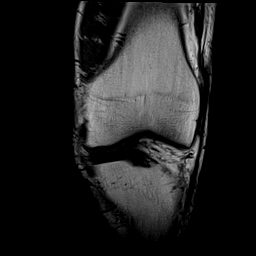
[im 13/26]
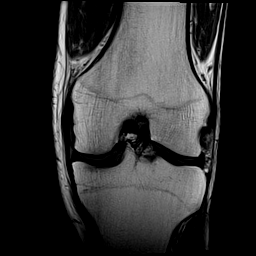
[im 17/26]
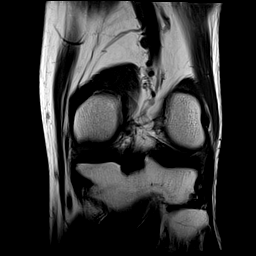
[im 21/26]
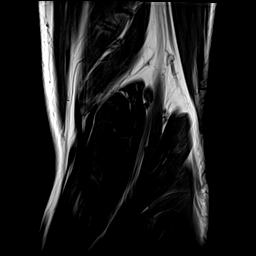
[im 26/26]
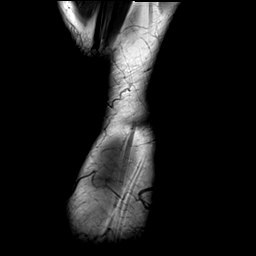

[Series 11: PD fat-sat · coronal · left · 4.0mm · 0.59mm/px · 7 of 26 slices shown (1 of 2)]
[im 1/26]
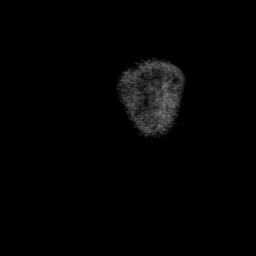
[im 5/26]
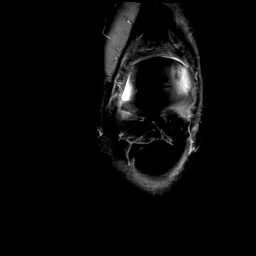
[im 9/26]
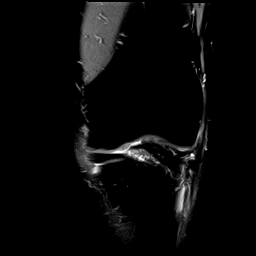
[im 13/26]
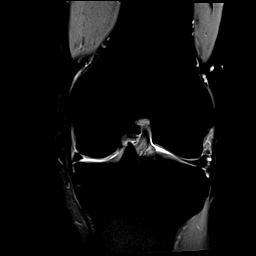
[im 17/26]
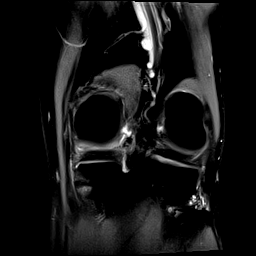
[im 21/26]
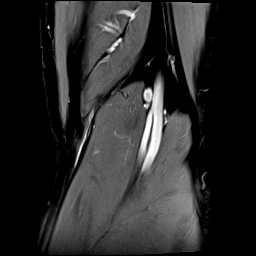
[im 26/26]
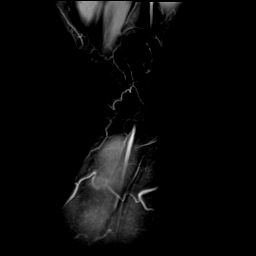

[Series 12: PD fat-sat · sagittal · left · 3.0mm · 0.59mm/px · 7 of 27 slices shown (2 of 2)]
[im 1/27]
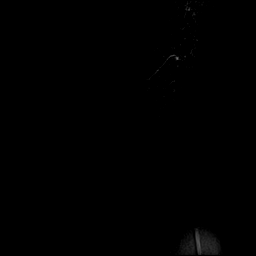
[im 5/27]
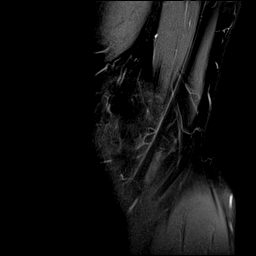
[im 9/27]
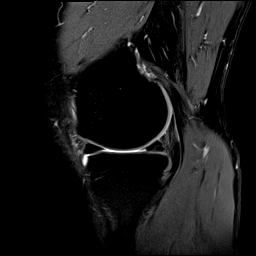
[im 14/27]
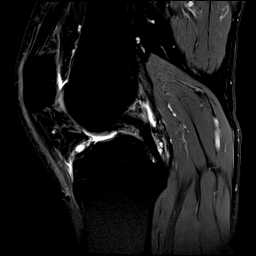
[im 18/27]
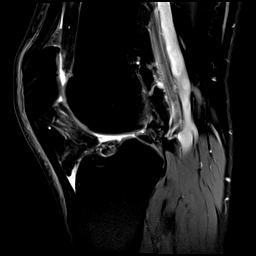
[im 22/27]
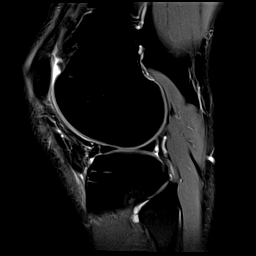
[im 27/27]
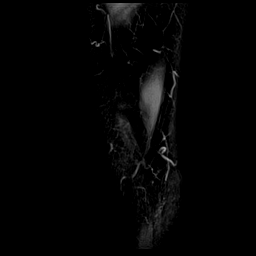

[Series 13: T2 fat-sat · sagittal · left · 3.0mm · 0.59mm/px · 7 of 28 slices shown (3 of 3)]
[im 1/28]
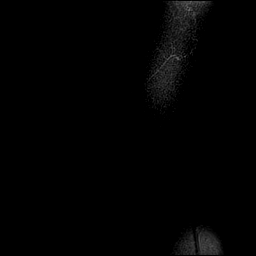
[im 5/28]
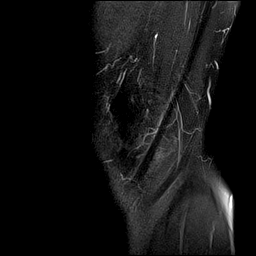
[im 10/28]
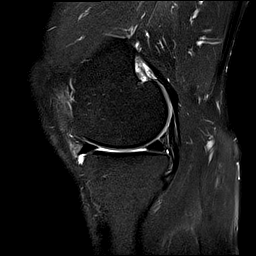
[im 14/28]
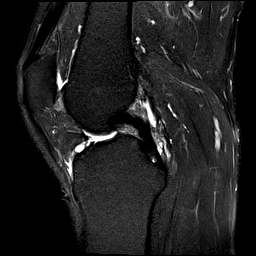
[im 19/28]
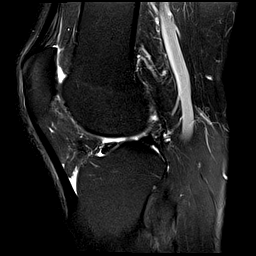
[im 23/28]
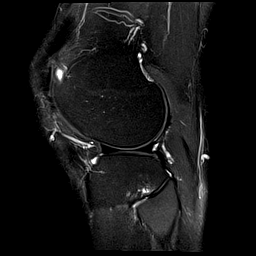
[im 28/28]
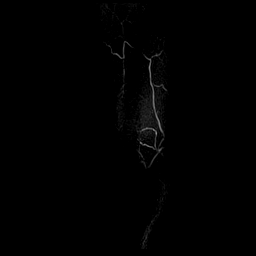

[40 of 40 positions shown; findings below may reference images not displayed]

FINDINGS: MENISCI

Medial meniscus: Smoothly blunted appearance of the medial meniscal
body and posterior horn compatible with history of prior partial
medial meniscectomy. No evidence to suggest new or recurrent medial
meniscal tear.

Lateral meniscus:  Intact.

LIGAMENTS

Cruciates: Intact ACL and PCL. Small multilobulated ganglion cyst
formation at the anterior intercondylar notch adjacent to the ACL
tibial attachment site with possible distal ACL ganglion cyst
(series 13, image 16).

Collaterals: Medial collateral ligament is intact. Lateral
collateral ligament complex is intact.

CARTILAGE

Patellofemoral: Progressive moderate chondral loss predominantly
involving the medial patellar facet and patellar apex with areas of
full-thickness fissuring (series 8, images 8-9). There is fissuring
within the trochlear groove and medial trochlea, also slightly
progressed.

Medial: Mild chondral thinning and surface irregularity without
focal cartilage defect, similar to prior.

Lateral:  No chondral defect.

Joint: No joint effusion. Small volume deep infrapatellar bursal
fluid. Mild edema within Hoffa's fat.

Popliteal Fossa: No Baker cyst. Intact popliteus tendon. Mild distal
popliteus tendinosis.

Extensor Mechanism: Intact quadriceps tendon and patellar tendon.
Mild patellar tendinosis.

Bones: No focal marrow signal abnormality. No fracture or
dislocation.

Other: Small multilobulated ganglion cysts again noted at the
proximal tibiofibular joint.
IMPRESSION: 1. Partial meniscectomy changes to the medial meniscus without
evidence of new or recurrent medial meniscal tear.
2. Progressive moderate chondral loss of the patellofemoral
compartment.
3. Mild tendinosis of the patellar tendon and distal popliteus
tendon.
4. Small ganglion cysts associated with the proximal tibiofibular
joint and ACL.

## 2021-09-10 DIAGNOSIS — E118 Type 2 diabetes mellitus with unspecified complications: Secondary | ICD-10-CM | POA: Diagnosis not present

## 2021-09-10 DIAGNOSIS — J4520 Mild intermittent asthma, uncomplicated: Secondary | ICD-10-CM | POA: Diagnosis not present

## 2021-09-10 DIAGNOSIS — R051 Acute cough: Secondary | ICD-10-CM | POA: Diagnosis not present

## 2021-09-10 DIAGNOSIS — J111 Influenza due to unidentified influenza virus with other respiratory manifestations: Secondary | ICD-10-CM | POA: Diagnosis not present

## 2021-09-11 DIAGNOSIS — I700 Atherosclerosis of aorta: Secondary | ICD-10-CM | POA: Diagnosis not present

## 2021-09-11 DIAGNOSIS — E1169 Type 2 diabetes mellitus with other specified complication: Secondary | ICD-10-CM | POA: Diagnosis not present

## 2021-09-11 DIAGNOSIS — E785 Hyperlipidemia, unspecified: Secondary | ICD-10-CM | POA: Diagnosis not present

## 2021-09-11 DIAGNOSIS — J4520 Mild intermittent asthma, uncomplicated: Secondary | ICD-10-CM | POA: Diagnosis not present

## 2021-10-08 DIAGNOSIS — I700 Atherosclerosis of aorta: Secondary | ICD-10-CM | POA: Diagnosis not present

## 2021-10-08 DIAGNOSIS — E785 Hyperlipidemia, unspecified: Secondary | ICD-10-CM | POA: Diagnosis not present

## 2021-10-08 DIAGNOSIS — E1169 Type 2 diabetes mellitus with other specified complication: Secondary | ICD-10-CM | POA: Diagnosis not present

## 2021-11-22 ENCOUNTER — Ambulatory Visit: Payer: HMO

## 2021-11-25 DIAGNOSIS — E785 Hyperlipidemia, unspecified: Secondary | ICD-10-CM | POA: Diagnosis not present

## 2021-11-25 DIAGNOSIS — E1169 Type 2 diabetes mellitus with other specified complication: Secondary | ICD-10-CM | POA: Diagnosis not present

## 2021-12-10 DIAGNOSIS — J432 Centrilobular emphysema: Secondary | ICD-10-CM | POA: Diagnosis not present

## 2021-12-10 DIAGNOSIS — I700 Atherosclerosis of aorta: Secondary | ICD-10-CM | POA: Diagnosis not present

## 2021-12-10 DIAGNOSIS — E785 Hyperlipidemia, unspecified: Secondary | ICD-10-CM | POA: Diagnosis not present

## 2021-12-10 DIAGNOSIS — E1169 Type 2 diabetes mellitus with other specified complication: Secondary | ICD-10-CM | POA: Diagnosis not present

## 2021-12-23 DIAGNOSIS — Z125 Encounter for screening for malignant neoplasm of prostate: Secondary | ICD-10-CM | POA: Diagnosis not present

## 2021-12-23 DIAGNOSIS — E538 Deficiency of other specified B group vitamins: Secondary | ICD-10-CM | POA: Diagnosis not present

## 2021-12-23 DIAGNOSIS — E118 Type 2 diabetes mellitus with unspecified complications: Secondary | ICD-10-CM | POA: Diagnosis not present

## 2021-12-30 DIAGNOSIS — E118 Type 2 diabetes mellitus with unspecified complications: Secondary | ICD-10-CM | POA: Diagnosis not present

## 2021-12-30 DIAGNOSIS — I700 Atherosclerosis of aorta: Secondary | ICD-10-CM | POA: Diagnosis not present

## 2021-12-30 DIAGNOSIS — Z0000 Encounter for general adult medical examination without abnormal findings: Secondary | ICD-10-CM | POA: Diagnosis not present

## 2021-12-30 DIAGNOSIS — E1169 Type 2 diabetes mellitus with other specified complication: Secondary | ICD-10-CM | POA: Diagnosis not present

## 2021-12-30 DIAGNOSIS — E785 Hyperlipidemia, unspecified: Secondary | ICD-10-CM | POA: Diagnosis not present

## 2021-12-30 DIAGNOSIS — G3184 Mild cognitive impairment, so stated: Secondary | ICD-10-CM | POA: Insufficient documentation

## 2021-12-30 DIAGNOSIS — M76899 Other specified enthesopathies of unspecified lower limb, excluding foot: Secondary | ICD-10-CM | POA: Diagnosis not present

## 2021-12-30 DIAGNOSIS — J432 Centrilobular emphysema: Secondary | ICD-10-CM | POA: Diagnosis not present

## 2021-12-30 DIAGNOSIS — Z23 Encounter for immunization: Secondary | ICD-10-CM | POA: Diagnosis not present

## 2022-01-17 DIAGNOSIS — E785 Hyperlipidemia, unspecified: Secondary | ICD-10-CM | POA: Diagnosis not present

## 2022-01-17 DIAGNOSIS — E1169 Type 2 diabetes mellitus with other specified complication: Secondary | ICD-10-CM | POA: Diagnosis not present

## 2022-01-17 DIAGNOSIS — J432 Centrilobular emphysema: Secondary | ICD-10-CM | POA: Diagnosis not present

## 2022-01-17 DIAGNOSIS — I700 Atherosclerosis of aorta: Secondary | ICD-10-CM | POA: Diagnosis not present

## 2022-01-31 DIAGNOSIS — E1169 Type 2 diabetes mellitus with other specified complication: Secondary | ICD-10-CM | POA: Diagnosis not present

## 2022-01-31 DIAGNOSIS — I700 Atherosclerosis of aorta: Secondary | ICD-10-CM | POA: Diagnosis not present

## 2022-01-31 DIAGNOSIS — J432 Centrilobular emphysema: Secondary | ICD-10-CM | POA: Diagnosis not present

## 2022-01-31 DIAGNOSIS — E785 Hyperlipidemia, unspecified: Secondary | ICD-10-CM | POA: Diagnosis not present

## 2022-01-31 DIAGNOSIS — M76899 Other specified enthesopathies of unspecified lower limb, excluding foot: Secondary | ICD-10-CM | POA: Diagnosis not present

## 2022-03-12 DIAGNOSIS — H353131 Nonexudative age-related macular degeneration, bilateral, early dry stage: Secondary | ICD-10-CM | POA: Diagnosis not present

## 2022-04-19 DIAGNOSIS — J0140 Acute pansinusitis, unspecified: Secondary | ICD-10-CM | POA: Diagnosis not present

## 2022-05-26 DIAGNOSIS — E785 Hyperlipidemia, unspecified: Secondary | ICD-10-CM | POA: Diagnosis not present

## 2022-05-26 DIAGNOSIS — E1169 Type 2 diabetes mellitus with other specified complication: Secondary | ICD-10-CM | POA: Diagnosis not present

## 2022-08-25 ENCOUNTER — Telehealth: Payer: Self-pay

## 2022-08-25 NOTE — Telephone Encounter (Signed)
Tried contacting patient regarding his status of being a current patient with [place]. Patient is coming up on our reports but has a PCP, Frazier Richards, MD, listed in his chart.   OK for PEC to speak to patient if he calls back. Please find out who patient's PCP is.

## 2022-09-01 NOTE — Telephone Encounter (Signed)
Tried reaching out to patient, no answer and VM could not be left. Will try to call ok.   OK for PEC to speak with patient and confirm current PCP with the patient.

## 2022-09-21 DIAGNOSIS — M1712 Unilateral primary osteoarthritis, left knee: Secondary | ICD-10-CM | POA: Insufficient documentation

## 2022-10-20 NOTE — Discharge Instructions (Signed)
Instructions after Total Knee Replacement   Carroll Lingelbach P. Blayre Papania, Jr., M.D.     Dept. of Orthopaedics & Sports Medicine  [facility]  [address]  [place], [place]  [address]  Phone: [phone]   Fax: [phone]    DIET: Drink plenty of non-alcoholic fluids. Resume your normal diet. Include foods high in fiber.  ACTIVITY:  You may use crutches or a walker with weight-bearing as tolerated, unless instructed otherwise. You may be weaned off of the walker or crutches by your Physical Therapist.  Do NOT place pillows under the knee. Anything placed under the knee could limit your ability to straighten the knee.   Continue doing gentle exercises. Exercising will reduce the pain and swelling, increase motion, and prevent muscle weakness.   Please continue to use the TED compression stockings for 6 weeks. You may remove the stockings at night, but should reapply them in the morning. Do not drive or operate any equipment until instructed.  WOUND CARE:  Continue to use the PolarCare or ice packs periodically to reduce pain and swelling. You may bathe or shower after the staples are removed at the first office visit following surgery.  MEDICATIONS: You may resume your regular medications. Please take the pain medication as prescribed on the medication. Do not take pain medication on an empty stomach. You have been given a prescription for a blood thinner (Lovenox or Coumadin). Please take the medication as instructed. (NOTE: After completing a 2 week course of Lovenox, take one Enteric-coated aspirin once a day. This along with elevation will help reduce the possibility of phlebitis in your operated leg.) Do not drive or drink alcoholic beverages when taking pain medications.  CALL THE OFFICE FOR: Temperature above 101 degrees Excessive bleeding or drainage on the dressing. Excessive swelling, coldness, or paleness of the toes. Persistent nausea and vomiting.  FOLLOW-UP:  You  should have an appointment to return to the office in 10-14 days after surgery. Arrangements have been made for continuation of Physical Therapy (either home therapy or outpatient therapy).   [facility] Department Directory         [email or web address]       [email or web address]          Cardiology  Appointments: [place] - [phone] [place] - [phone]  Endocrinology  Appointments: [place] - [phone] [place] - [phone]  Gastroenterology  Appointments: [place] - [phone] [place] - [phone]        General Surgery   Appointments: [place] - [phone]  Internal Medicine/Family Medicine  Appointments: [place] - [phone] [place] - [phone] [place] - [phone]  Metabolic and Weigh Loss Surgery  Appointments: [place] - [phone]        Neurology  Appointments: [place] - [phone] [place] - [phone]  Neurosurgery  Appointments: [place] - [phone]  Obstetrics & Gynecology  Appointments: [place] - [phone] [place] - [phone]        Pediatrics  Appointments: [place] - [phone] [place] - [phone]  Physiatry  Appointments: [place] -[phone]  Physical Therapy  Appointments: [place] - [phone] [place] - [phone]        Podiatry  Appointments: [place] - [phone] [place] - [phone]  Pulmonology  Appointments: [place] - [phone]  Rheumatology  Appointments: [place] - [phone]         Location: [facility]  [address] , [place]  [address]  [place] Location: [facility] [address] [place], [place]  [address]  [place] Location: [facility] [address] [place], [place]  [address]    

## 2022-11-11 ENCOUNTER — Other Ambulatory Visit: Payer: Self-pay

## 2022-11-11 ENCOUNTER — Encounter
Admission: RE | Admit: 2022-11-11 | Discharge: 2022-11-11 | Disposition: A | Discharge status: Home / Self Care | Payer: PPO | Source: Ambulatory Visit | Attending: Orthopedic Surgery | Admitting: Orthopedic Surgery

## 2022-11-11 DIAGNOSIS — Z01812 Encounter for preprocedural laboratory examination: Secondary | ICD-10-CM

## 2022-11-11 DIAGNOSIS — R001 Bradycardia, unspecified: Secondary | ICD-10-CM | POA: Insufficient documentation

## 2022-11-11 DIAGNOSIS — M1712 Unilateral primary osteoarthritis, left knee: Secondary | ICD-10-CM | POA: Insufficient documentation

## 2022-11-11 DIAGNOSIS — Z01818 Encounter for other preprocedural examination: Secondary | ICD-10-CM | POA: Insufficient documentation

## 2022-11-11 DIAGNOSIS — E119 Type 2 diabetes mellitus without complications: Secondary | ICD-10-CM

## 2022-11-11 DIAGNOSIS — E785 Hyperlipidemia, unspecified: Secondary | ICD-10-CM | POA: Insufficient documentation

## 2022-11-11 DIAGNOSIS — E1169 Type 2 diabetes mellitus with other specified complication: Secondary | ICD-10-CM | POA: Diagnosis not present

## 2022-11-11 DIAGNOSIS — Z01810 Encounter for preprocedural cardiovascular examination: Secondary | ICD-10-CM | POA: Diagnosis not present

## 2022-11-11 HISTORY — DX: Pneumonia, unspecified organism: J18.9

## 2022-11-11 HISTORY — DX: Other specified postprocedural states: Z98.890

## 2022-11-11 HISTORY — DX: Cardiac murmur, unspecified: R01.1

## 2022-11-11 HISTORY — DX: Cardiac arrhythmia, unspecified: I49.9

## 2022-11-11 HISTORY — DX: Nausea with vomiting, unspecified: R11.2

## 2022-11-11 HISTORY — DX: Gastro-esophageal reflux disease without esophagitis: K21.9

## 2022-11-11 HISTORY — DX: Personal history of urinary calculi: Z87.442

## 2022-11-11 HISTORY — DX: Unspecified osteoarthritis, unspecified site: M19.90

## 2022-11-11 LAB — COMPREHENSIVE METABOLIC PANEL
ALT: 21 U/L (ref 0–44)
AST: 21 U/L (ref 15–41)
Albumin: 4.3 g/dL (ref 3.5–5.0)
Alkaline Phosphatase: 67 U/L (ref 38–126)
Anion gap: 8 (ref 5–15)
BUN: 18 mg/dL (ref 8–23)
CO2: 26 mmol/L (ref 22–32)
Calcium: 8.9 mg/dL (ref 8.9–10.3)
Chloride: 104 mmol/L (ref 98–111)
Creatinine, Ser: 1.11 mg/dL (ref 0.61–1.24)
GFR, Estimated: >60 mL/min (ref >60)
Glucose, Bld: 150 mg/dL — ABNORMAL HIGH (ref 70–99)
Potassium: 3.8 mmol/L (ref 3.5–5.1)
Sodium: 138 mmol/L (ref 135–145)
Total Bilirubin: 0.7 mg/dL (ref 0.3–1.2)
Total Protein: 7.2 g/dL (ref 6.5–8.1)

## 2022-11-11 LAB — URINALYSIS, ROUTINE W REFLEX MICROSCOPIC
Bilirubin Urine: NEGATIVE
Glucose, UA: >=500 mg/dL — AB
Hgb urine dipstick: NEGATIVE
Ketones, ur: NEGATIVE mg/dL
Leukocytes,Ua: NEGATIVE
Nitrite: NEGATIVE
Protein, ur: NEGATIVE mg/dL
Specific Gravity, Urine: 1.035 — ABNORMAL HIGH (ref 1.005–1.030)
Squamous Epithelial / HPF: NONE SEEN /HPF (ref 0–5)
pH: 5 (ref 5.0–8.0)

## 2022-11-11 LAB — SEDIMENTATION RATE: Sed Rate: 2 mm/hr (ref 0–20)

## 2022-11-11 LAB — HEMOGLOBIN A1C
Hgb A1c MFr Bld: 7.2 % — ABNORMAL HIGH (ref 4.8–5.6)
Mean Plasma Glucose: 159.94 mg/dL

## 2022-11-11 LAB — TYPE AND SCREEN
ABO/RH(D): A POS
Antibody Screen: NEGATIVE

## 2022-11-11 LAB — CBC
HCT: 45.7 % (ref 39.0–52.0)
Hemoglobin: 15.4 g/dL (ref 13.0–17.0)
MCH: 30.4 pg (ref 26.0–34.0)
MCHC: 33.7 g/dL (ref 30.0–36.0)
MCV: 90.3 fL (ref 80.0–100.0)
Platelets: 173 10*3/uL (ref 150–400)
RBC: 5.06 MIL/uL (ref 4.22–5.81)
RDW: 13.2 % (ref 11.5–15.5)
WBC: 5.3 10*3/uL (ref 4.0–10.5)
nRBC: 0 % (ref 0.0–0.2)

## 2022-11-11 LAB — SURGICAL PCR SCREEN
MRSA, PCR: NEGATIVE
Staphylococcus aureus: NEGATIVE

## 2022-11-11 LAB — C-REACTIVE PROTEIN: CRP: <0.5 mg/dL (ref <1.0)

## 2022-11-11 NOTE — Patient Instructions (Addendum)
Your procedure is scheduled on:11/21/22 - Friday Report to the Registration Desk on the 1st floor of the [facility]. To find out your arrival time, please call [phone] between 1PM - 3PM on: 11/20/22 - Thursday If your arrival time is 6:00 am, do not arrive prior to that time as the [facility] entrance doors do not open until 6:00 am.  REMEMBER: Instructions that are not followed completely may result in serious medical risk, up to and including death; or upon the discretion of your surgeon and anesthesiologist your surgery may need to be rescheduled.  Do not eat food after midnight the night before surgery.  No gum chewing, lozengers or hard candies.  You may however, drink CLEAR liquids up to 2 hours before you are scheduled to arrive for your surgery. Do not drink anything within 2 hours of your scheduled arrival time.  Type 1 and Type 2 diabetics should only drink water.  In addition, your doctor has ordered for you to drink the provided  Gatorade G2 Drinking this carbohydrate drink up to two hours before surgery helps to reduce insulin resistance and improve patient outcomes. Please complete drinking 2 hours prior to scheduled arrival time.  TAKE THESE MEDICATIONS THE MORNING OF SURGERY WITH A SIP OF WATER:  - omeprazole (PRILOSEC) - (take one the night before and one on the morning of surgery - helps to prevent nausea after surgery.)  - atorvastatin (LIPITOR)   - HOLD metFORMIN (GLUCOPHAGE) beginning 11/19/22 prior to your surgery.  - HOLD JARDIANCE beginning 11/18/22 prior to your surgery.  - HOLD Dulaglutide (TRULICITY) 7 days prior to your surgery.  One week prior to surgery: Stop Anti-inflammatories (NSAIDS) such as Advil, Aleve, Ibuprofen, Motrin, Naproxen, Naprosyn and Aspirin based products such as Excedrin, Goodys Powder, BC Powder.  Stop ANY OVER THE COUNTER supplements until after surgery.  You may however, continue to take Tylenol if needed for pain up  until the day of surgery.  No Alcohol for 24 hours before or after surgery.  No Smoking including e-cigarettes for 24 hours prior to surgery.  No chewable tobacco products for at least 6 hours prior to surgery.  No nicotine patches on the day of surgery.  Do not use any "recreational" drugs for at least a week prior to your surgery.  Please be advised that the combination of cocaine and anesthesia may have negative outcomes, up to and including death. If you test positive for cocaine, your surgery will be cancelled.  On the morning of surgery brush your teeth with toothpaste and water, you may rinse your mouth with mouthwash if you wish. Do not swallow any toothpaste or mouthwash.  Use CHG Soap or wipes as directed on instruction sheet.  Do not wear jewelry, make-up, hairpins, clips or nail polish.  Do not wear lotions, powders, or perfumes.   Do not shave body from the neck down 48 hours prior to surgery just in case you cut yourself which could leave a site for infection.  Also, freshly shaved skin may become irritated if using the CHG soap.  Contact lenses, hearing aids and dentures may not be worn into surgery.  Do not bring valuables to the hospital. [facility] is not responsible for any missing/lost belongings or valuables.   Notify your doctor if there is any change in your medical condition (cold, fever, infection).  Wear comfortable clothing (specific to your surgery type) to the hospital.  After surgery, you can help prevent lung complications by doing  breathing exercises.  Take deep breaths and cough every 1-2 hours. Your doctor may order a device called an Incentive Spirometer to help you take deep breaths. When coughing or sneezing, hold a pillow firmly against your incision with both hands. This is called "splinting." Doing this helps protect your incision. It also decreases belly discomfort.  If you are being admitted to the hospital overnight, leave your suitcase  in the car. After surgery it may be brought to your room.  If you are being discharged the day of surgery, you will not be allowed to drive home. You will need a responsible adult (18 years or older) to drive you home and stay with you that night.   If you are taking public transportation, you will need to have a responsible adult (18 years or older) with you. Please confirm with your physician that it is acceptable to use public transportation.   Please call the [facility] Dept. at [phone] if you have any questions about these instructions.  Surgery Visitation Policy:  Patients undergoing a surgery or procedure may have two family members or support persons with them as long as the person is not COVID-19 positive or experiencing its symptoms.   Inpatient Visitation:    Visiting hours are 7 a.m. to 8 p.m. Up to four visitors are allowed at one time in a patient room. The visitors may rotate out with other people during the day. One designated support person (adult) may remain overnight.  Due to an increase in RSV and influenza rates and associated hospitalizations, children ages 29 and under will not be able to visit patients in [facility]. Masks continue to be strongly recommended.

## 2022-11-16 NOTE — H&P (Signed)
ORTHOPAEDIC HISTORY & PHYSICAL Regino Bellow, PA - 11/11/2022 11:00 AM EST Formatting of this note is different from the original. Images from the original note were not included. Chief Complaint Chief Complaint Patient presents with Knee Pain H & P LEFT KNEE  Reason for Visit [name] is a 67 y.o. who presents today for a history and physical. He is to undergo a left total knee arthroplasty on 11/21/2022. There is been no change in his condition since he was last in the clinic. Patient states that he wishes to proceed with having knee replacement.  He has a several year history of left knee pain. He is status post left knee arthroscopy, partial medial meniscectomy and chondroplasty in 2016. He was noted to have grade 3 changes of chondromalacia to the medial and patellofemoral compartments at that time. He localizes most of the pain along the medial aspect of the knee. He reports some swelling, no locking, and no giving way of the knee. The pain is aggravated by any weight bearing. The knee pain limits the patient's ability to ambulate long distances. The patient has not appreciated any significant improvement despite Tylenol, intraarticular corticosteroid injections, and activity modification. He is unable to tolerate NSAIDs. He is not using any ambulatory aids. The patient states that the knee pain has progressed to the point that it is significantly interfering with his activities of daily living.  Past Medical History Past Medical History: Diagnosis Date Atypical chest pain Diabetes (CMS-HCC) GERD (gastroesophageal reflux disease) Heart murmur History of pneumothorax 10/20/1974 Hypercholesterolemia Hypertension Intermittent asthma Kidney stone remote Varicella  Past Surgical History Past Surgical History: Procedure Laterality Date COLONOSCOPY 02/06/2006 Adenomatous Polyp COLONOSCOPY 12/19/2011 PH Adenomatous Polyp: CBF 12/2016; Recall Ltr mailed 11/13/2016 (dw) Left knee  arthroscopy, partial medial meniscectomy, and chondroplasty of the medial and patellofemoral compartments 09/24/2015 Dr Marry Guan COLONOSCOPY 03/15/2019 [place] Adenomatous Polyps: CBF 02/2024 EGD 03/15/2019 Gastritis: CBF 05/2019 Recall ltr mailed APPENDECTOMY LITHOTRIPSY Pneumothorax  Past Family History Family History Problem Relation Age of Onset Myocardial Infarction (Heart attack) Mother Diabetes Mother Myocardial Infarction (Heart attack) Father Colon polyps Sister Lung cancer Sister Stomach cancer Sister Myocardial Infarction (Heart attack) Other Diabetes Other Breast cancer Other Prostate cancer Other  Medications Current Outpatient Medications Ordered in Epic Medication Sig Dispense Refill acetaminophen (TYLENOL) 500 MG tablet Take 1,000-1,500 mg by mouth every 8 (eight) hours as needed for Pain albuterol 90 mcg/actuation inhaler Inhale 2 inhalations into the lungs every 4 (four) hours as needed aspirin 81 MG EC tablet Take 81 mg by mouth once daily. atorvastatin (LIPITOR) 40 MG tablet Take 1 tablet (40 mg total) by mouth once daily 90 tablet 3 cholecalciferol (VITAMIN D3) 1,000 unit capsule Take 5,000 Units by mouth once daily clonazePAM (KLONOPIN) 1 MG tablet Take 1 tablet (1 mg total) by mouth at bedtime as needed for Anxiety 90 tablet 1 donepeziL (ARICEPT) 10 MG tablet TAKE ONE TABLET BY MOUTH EVERYDAY AT BEDTIME 90 tablet 3 dulaglutide (TRULICITY) 1.5 RU/0.4 mL subcutaneous pen injector INJECT 1.5 MG (0.5ML) UNDER THE SKIN ONCE A WEEK 6 mL 3 flash glucose sensor (FREESTYLE LIBRE 2 SENSOR) kit Use 1 kit every 14 (fourteen) days for glucose monitoring 6 kit 3 fluticasone propionate (FLONASE) 50 mcg/actuation nasal spray Place 1 spray into both nostrils 2 (two) times daily 16 g 0 FREESTYLE LIBRE 2 READER reader Use to monitor blood glucose. 1 each 1 JARDIANCE 25 mg tablet TAKE ONE TABLET BY MOUTH EVERY MORNING 30 tablet 6 melatonin 10 mg Tab Take  1 tablet by mouth at bedtime  as needed metFORMIN (GLUCOPHAGE-XR) 500 MG XR tablet Two pills (1000mg ) with breakfast, one pill (500mg )with supper. 270 tablet 4 multivitamin capsule Take 1 capsule by mouth once daily. omeprazole (PRILOSEC) 40 MG DR capsule Take 1 capsule (40 mg total) by mouth 2 (two) times daily 180 capsule 3 simethicone (MYLICON) 440 MG chewable tablet Take 125 mg by mouth every 6 (six) hours as needed for Flatulence vitamin E 400 UNIT capsule Take 400 Units by mouth once daily zinc 50 mg Tab Take 1 tablet by mouth once daily tiotropium (SPIRIVA) 18 mcg inhalation capsule Place 1 capsule (18 mcg total) into inhaler and inhale once daily (Patient not taking: Reported on 11/11/2022) 30 capsule 6  No current Epic-ordered facility-administered medications on file.  Allergies No Known Allergies  Review of Systems A comprehensive 14 point ROS was performed, reviewed, and the pertinent orthopaedic findings are documented in the HPI.  Exam BP 110/70 (BP Location: Left upper arm, Patient Position: Sitting, BP Cuff Size: Adult)  Ht 177.8 cm (5\' 10" )  Wt 75.8 kg (167 lb 3.2 oz)  BMI 23.99 kg/m  General: Well-developed well-nourished male seen in no acute distress.  HEENT: Atraumatic,normocephalic. Pupils are equal and reactive to light. Oropharynx is clear with moist mucosa  Lungs: Clear to auscultation bilaterally  Cardiovascular: Regular rate and rhythm. Normal S1, S2. No murmurs. No appreciable gallops or rubs. Peripheral pulses are palpable.  Abdomen: Soft, non-tender, nondistended. Bowel sounds present  Extremity: Left Knee: Soft tissue swelling: minimal Effusion: none Erythema: none Crepitance: mild Tenderness: medial Alignment: normal Mediolateral laxity: stable Posterior sag: negative Patellar tracking: Good tracking without evidence of subluxation or tilt Atrophy: No significant atrophy. Quadriceps tone was fair to good. Range of motion: 0/10/124 degrees  Neurological:  The  patient is alert and oriented Sensation to light touch appears to be intact and within normal limits Gross motor strength appeared to be equal to 5/5  Vascular :  Peripheral pulses felt to be palpable. Capillary refill appears to be intact and within normal limits  X-ray  1. X-rays taken in [facility] on 09/18/2022 shows narrowing of the medial cartilage space. Osteophyte formation was also noted. There was no subchondral sclerosis noted. No evidence of a fracture or dislocation. Patella tracking well.  Impression  1. Degenerative arthrosis left knee  Plan  1. I did go over the patient's medication list on today's visit including all of his diabetic medications as to when to stop them. 2. Past medical history was reviewed. 3. Did discuss postop rehab course. Patient states surgery is on Friday and his wife is wanting him to stay until Sunday. 4. Return to clinic 2 weeks postop. Sooner if any problems  This note was generated in part with voice recognition software and I apologize for any typographical errors that were not detected and corrected   Watt Climes PA Electronically signed by Regino Bellow, PA at 11/11/2022 12:09 PM EST

## 2022-11-21 ENCOUNTER — Ambulatory Visit: Payer: PPO | Admitting: Urgent Care

## 2022-11-21 ENCOUNTER — Other Ambulatory Visit: Payer: Self-pay

## 2022-11-21 ENCOUNTER — Encounter
Admission: RE | Disposition: A | Discharge status: Home Health | Payer: Self-pay | Source: Home / Self Care | Attending: Orthopedic Surgery

## 2022-11-21 ENCOUNTER — Observation Stay
Admission: RE | Admit: 2022-11-21 | Discharge: 2022-11-22 | Disposition: A | Discharge status: Home Health | Payer: PPO | Attending: Orthopedic Surgery | Admitting: Orthopedic Surgery

## 2022-11-21 ENCOUNTER — Ambulatory Visit: Payer: PPO | Admitting: Certified Registered Nurse Anesthetist

## 2022-11-21 ENCOUNTER — Observation Stay: Payer: PPO

## 2022-11-21 ENCOUNTER — Encounter: Payer: Self-pay | Admitting: Orthopedic Surgery

## 2022-11-21 DIAGNOSIS — M1712 Unilateral primary osteoarthritis, left knee: Principal | ICD-10-CM | POA: Insufficient documentation

## 2022-11-21 DIAGNOSIS — Z79899 Other long term (current) drug therapy: Secondary | ICD-10-CM | POA: Diagnosis not present

## 2022-11-21 DIAGNOSIS — E785 Hyperlipidemia, unspecified: Secondary | ICD-10-CM | POA: Diagnosis not present

## 2022-11-21 DIAGNOSIS — E119 Type 2 diabetes mellitus without complications: Secondary | ICD-10-CM | POA: Diagnosis not present

## 2022-11-21 DIAGNOSIS — J45909 Unspecified asthma, uncomplicated: Secondary | ICD-10-CM | POA: Diagnosis not present

## 2022-11-21 DIAGNOSIS — Z96652 Presence of left artificial knee joint: Secondary | ICD-10-CM | POA: Diagnosis not present

## 2022-11-21 DIAGNOSIS — Z8616 Personal history of COVID-19: Secondary | ICD-10-CM | POA: Insufficient documentation

## 2022-11-21 DIAGNOSIS — Z7984 Long term (current) use of oral hypoglycemic drugs: Secondary | ICD-10-CM | POA: Diagnosis not present

## 2022-11-21 DIAGNOSIS — J4520 Mild intermittent asthma, uncomplicated: Secondary | ICD-10-CM | POA: Diagnosis not present

## 2022-11-21 DIAGNOSIS — Z7985 Long-term (current) use of injectable non-insulin antidiabetic drugs: Secondary | ICD-10-CM | POA: Diagnosis not present

## 2022-11-21 DIAGNOSIS — Z471 Aftercare following joint replacement surgery: Secondary | ICD-10-CM | POA: Diagnosis not present

## 2022-11-21 DIAGNOSIS — Z7982 Long term (current) use of aspirin: Secondary | ICD-10-CM | POA: Insufficient documentation

## 2022-11-21 DIAGNOSIS — Z87891 Personal history of nicotine dependence: Secondary | ICD-10-CM | POA: Diagnosis not present

## 2022-11-21 DIAGNOSIS — Z01812 Encounter for preprocedural laboratory examination: Secondary | ICD-10-CM

## 2022-11-21 DIAGNOSIS — I10 Essential (primary) hypertension: Secondary | ICD-10-CM | POA: Insufficient documentation

## 2022-11-21 DIAGNOSIS — Z96659 Presence of unspecified artificial knee joint: Secondary | ICD-10-CM

## 2022-11-21 DIAGNOSIS — E1169 Type 2 diabetes mellitus with other specified complication: Secondary | ICD-10-CM

## 2022-11-21 HISTORY — PX: KNEE ARTHROPLASTY: SHX992

## 2022-11-21 LAB — GLUCOSE, CAPILLARY
Glucose-Capillary: 175 mg/dL — ABNORMAL HIGH (ref 70–99)
Glucose-Capillary: 215 mg/dL — ABNORMAL HIGH (ref 70–99)
Glucose-Capillary: 271 mg/dL — ABNORMAL HIGH (ref 70–99)

## 2022-11-21 LAB — ABO/RH: ABO/RH(D): A POS

## 2022-11-21 SURGERY — ARTHROPLASTY, KNEE, TOTAL, USING IMAGELESS COMPUTER-ASSISTED NAVIGATION
Anesthesia: Spinal | Site: Knee | Laterality: Left

## 2022-11-21 MED ORDER — ONDANSETRON HCL 4 MG PO TABS: 4.0000 mg | ORAL_TABLET | Freq: Four times a day (QID) | ORAL | Status: DC | PRN

## 2022-11-21 MED ORDER — GABAPENTIN 300 MG PO CAPS: 300.0000 mg | ORAL_CAPSULE | Freq: Once | ORAL | Status: AC

## 2022-11-21 MED ORDER — CELECOXIB 200 MG PO CAPS: 400.0000 mg | ORAL_CAPSULE | Freq: Once | ORAL | Status: AC

## 2022-11-21 MED ORDER — TRAMADOL HCL 50 MG PO TABS: 50.0000 mg | ORAL_TABLET | ORAL | Status: DC | PRN

## 2022-11-21 MED ORDER — CEFAZOLIN SODIUM-DEXTROSE 2-4 GM/100ML-% IV SOLN
2.0000 g | Freq: Four times a day (QID) | INTRAVENOUS | Status: AC
  Administered 2022-11-21 (×2): 2 g via INTRAVENOUS
  Filled 2022-11-21 (×2): qty 100

## 2022-11-21 MED ORDER — PANTOPRAZOLE SODIUM 40 MG PO TBEC
40.0000 mg | DELAYED_RELEASE_TABLET | Freq: Two times a day (BID) | ORAL | Status: DC
  Administered 2022-11-21 – 2022-11-22 (×2): 40 mg via ORAL
  Filled 2022-11-21 (×2): qty 1

## 2022-11-21 MED ORDER — INSULIN ASPART 100 UNIT/ML IJ SOLN
3.0000 [IU] | Freq: Once | INTRAMUSCULAR | Status: AC
  Administered 2022-11-21: 3 [IU] via SUBCUTANEOUS

## 2022-11-21 MED ORDER — ORAL CARE MOUTH RINSE: 15.0000 mL | Freq: Once | OROMUCOSAL | Status: AC

## 2022-11-21 MED ORDER — DONEPEZIL HCL 5 MG PO TABS
10.0000 mg | ORAL_TABLET | Freq: Every day | ORAL | Status: DC
  Administered 2022-11-21: 10 mg via ORAL
  Filled 2022-11-21: qty 2

## 2022-11-21 MED ORDER — FERROUS SULFATE 325 (65 FE) MG PO TABS
325.0000 mg | ORAL_TABLET | Freq: Two times a day (BID) | ORAL | Status: DC
  Administered 2022-11-21 – 2022-11-22 (×2): 325 mg via ORAL
  Filled 2022-11-21 (×2): qty 1

## 2022-11-21 MED ORDER — SODIUM CHLORIDE 0.9 % IV SOLN: INTRAVENOUS | Status: DC

## 2022-11-21 MED ORDER — BUPIVACAINE HCL (PF) 0.5 % IJ SOLN
INTRAMUSCULAR | Status: DC | PRN
  Administered 2022-11-21: 3 mL

## 2022-11-21 MED ORDER — MAGNESIUM HYDROXIDE 400 MG/5ML PO SUSP
30.0000 mL | Freq: Every day | ORAL | Status: DC
  Administered 2022-11-22: 30 mL via ORAL
  Filled 2022-11-21 (×2): qty 30

## 2022-11-21 MED ORDER — MENTHOL 3 MG MT LOZG: 1.0000 | LOZENGE | OROMUCOSAL | Status: DC | PRN

## 2022-11-21 MED ORDER — PHENYLEPHRINE HCL-NACL 20-0.9 MG/250ML-% IV SOLN
INTRAVENOUS | Status: AC
  Filled 2022-11-21: qty 250

## 2022-11-21 MED ORDER — CELECOXIB 200 MG PO CAPS
ORAL_CAPSULE | ORAL | Status: AC
  Administered 2022-11-21: 400 mg via ORAL
  Filled 2022-11-21: qty 2

## 2022-11-21 MED ORDER — GABAPENTIN 300 MG PO CAPS
ORAL_CAPSULE | ORAL | Status: AC
  Administered 2022-11-21: 300 mg via ORAL
  Filled 2022-11-21: qty 1

## 2022-11-21 MED ORDER — DIPHENHYDRAMINE HCL 12.5 MG/5ML PO ELIX: 12.5000 mg | ORAL_SOLUTION | ORAL | Status: DC | PRN

## 2022-11-21 MED ORDER — ALBUTEROL SULFATE (2.5 MG/3ML) 0.083% IN NEBU: 2.5000 mg | INHALATION_SOLUTION | Freq: Four times a day (QID) | RESPIRATORY_TRACT | Status: DC | PRN

## 2022-11-21 MED ORDER — SODIUM CHLORIDE 0.9 % IR SOLN
Status: DC | PRN
  Administered 2022-11-21: 3000 mL

## 2022-11-21 MED ORDER — OXYCODONE HCL 5 MG PO TABS: 5.0000 mg | ORAL_TABLET | ORAL | Status: DC | PRN

## 2022-11-21 MED ORDER — DEXAMETHASONE SODIUM PHOSPHATE 10 MG/ML IJ SOLN
INTRAMUSCULAR | Status: AC
  Administered 2022-11-21: 8 mg via INTRAVENOUS
  Filled 2022-11-21: qty 1

## 2022-11-21 MED ORDER — PHENYLEPHRINE HCL-NACL 20-0.9 MG/250ML-% IV SOLN
INTRAVENOUS | Status: DC | PRN
  Administered 2022-11-21: 20 ug/min via INTRAVENOUS

## 2022-11-21 MED ORDER — ATORVASTATIN CALCIUM 20 MG PO TABS
40.0000 mg | ORAL_TABLET | Freq: Every day | ORAL | Status: DC
  Administered 2022-11-21 – 2022-11-22 (×2): 40 mg via ORAL
  Filled 2022-11-21 (×2): qty 2

## 2022-11-21 MED ORDER — FENTANYL CITRATE (PF) 100 MCG/2ML IJ SOLN: 25.0000 ug | INTRAMUSCULAR | Status: DC | PRN

## 2022-11-21 MED ORDER — MIDAZOLAM HCL 5 MG/5ML IJ SOLN
INTRAMUSCULAR | Status: DC | PRN
  Administered 2022-11-21: .5 mg via INTRAVENOUS
  Administered 2022-11-21: 1.5 mg via INTRAVENOUS

## 2022-11-21 MED ORDER — TRANEXAMIC ACID-NACL 1000-0.7 MG/100ML-% IV SOLN: 1000.0000 mg | Freq: Once | INTRAVENOUS | Status: AC

## 2022-11-21 MED ORDER — INSULIN ASPART 100 UNIT/ML IJ SOLN
0.0000 [IU] | Freq: Three times a day (TID) | INTRAMUSCULAR | Status: DC
  Administered 2022-11-22: 8 [IU] via SUBCUTANEOUS
  Administered 2022-11-22: 5 [IU] via SUBCUTANEOUS
  Filled 2022-11-21 (×2): qty 1

## 2022-11-21 MED ORDER — HYDROMORPHONE HCL 1 MG/ML IJ SOLN
0.5000 mg | INTRAMUSCULAR | Status: DC | PRN
  Administered 2022-11-21 – 2022-11-22 (×3): 1 mg via INTRAVENOUS
  Filled 2022-11-21 (×3): qty 1

## 2022-11-21 MED ORDER — METOCLOPRAMIDE HCL 5 MG PO TABS
10.0000 mg | ORAL_TABLET | Freq: Three times a day (TID) | ORAL | Status: DC
  Administered 2022-11-21 – 2022-11-22 (×3): 10 mg via ORAL
  Filled 2022-11-21 (×3): qty 2

## 2022-11-21 MED ORDER — SENNOSIDES-DOCUSATE SODIUM 8.6-50 MG PO TABS
1.0000 | ORAL_TABLET | Freq: Two times a day (BID) | ORAL | Status: DC
  Administered 2022-11-21 – 2022-11-22 (×2): 1 via ORAL
  Filled 2022-11-21 (×2): qty 1

## 2022-11-21 MED ORDER — CHLORHEXIDINE GLUCONATE 0.12 % MT SOLN
OROMUCOSAL | Status: AC
  Administered 2022-11-21: 15 mL via OROMUCOSAL
  Filled 2022-11-21: qty 15

## 2022-11-21 MED ORDER — DEXAMETHASONE SODIUM PHOSPHATE 10 MG/ML IJ SOLN: 8.0000 mg | Freq: Once | INTRAMUSCULAR | Status: AC

## 2022-11-21 MED ORDER — FENTANYL CITRATE (PF) 100 MCG/2ML IJ SOLN
INTRAMUSCULAR | Status: DC | PRN
  Administered 2022-11-21 (×2): 50 ug via INTRAVENOUS

## 2022-11-21 MED ORDER — OXYCODONE HCL 5 MG/5ML PO SOLN: 5.0000 mg | Freq: Once | ORAL | Status: DC | PRN

## 2022-11-21 MED ORDER — CHLORHEXIDINE GLUCONATE 0.12 % MT SOLN: 15.0000 mL | Freq: Once | OROMUCOSAL | Status: AC

## 2022-11-21 MED ORDER — METFORMIN HCL 500 MG PO TABS
500.0000 mg | ORAL_TABLET | Freq: Two times a day (BID) | ORAL | Status: DC
  Administered 2022-11-21 – 2022-11-22 (×2): 500 mg via ORAL
  Filled 2022-11-21 (×2): qty 1

## 2022-11-21 MED ORDER — CEFAZOLIN SODIUM-DEXTROSE 2-4 GM/100ML-% IV SOLN
INTRAVENOUS | Status: AC
  Filled 2022-11-21: qty 100

## 2022-11-21 MED ORDER — ALBUTEROL SULFATE HFA 108 (90 BASE) MCG/ACT IN AERS: 2.0000 | INHALATION_SPRAY | Freq: Four times a day (QID) | RESPIRATORY_TRACT | Status: DC | PRN

## 2022-11-21 MED ORDER — TRANEXAMIC ACID-NACL 1000-0.7 MG/100ML-% IV SOLN
INTRAVENOUS | Status: AC
  Filled 2022-11-21: qty 100

## 2022-11-21 MED ORDER — IRRISEPT - 450ML BOTTLE WITH 0.05% CHG IN STERILE WATER, USP 99.95% OPTIME
TOPICAL | Status: DC | PRN
  Administered 2022-11-21: 450 mL

## 2022-11-21 MED ORDER — ACETAMINOPHEN 10 MG/ML IV SOLN
INTRAVENOUS | Status: AC
  Filled 2022-11-21: qty 100

## 2022-11-21 MED ORDER — ONDANSETRON HCL 4 MG/2ML IJ SOLN: 4.0000 mg | Freq: Once | INTRAMUSCULAR | Status: DC | PRN

## 2022-11-21 MED ORDER — CLONAZEPAM 1 MG PO TABS
1.0000 mg | ORAL_TABLET | Freq: Every day | ORAL | Status: DC | PRN
  Administered 2022-11-21: 1 mg via ORAL
  Filled 2022-11-21: qty 1

## 2022-11-21 MED ORDER — FLEET ENEMA 7-19 GM/118ML RE ENEM: 1.0000 | ENEMA | Freq: Once | RECTAL | Status: DC | PRN

## 2022-11-21 MED ORDER — INSULIN ASPART 100 UNIT/ML IJ SOLN
0.0000 [IU] | Freq: Every day | INTRAMUSCULAR | Status: DC
  Administered 2022-11-21: 3 [IU] via SUBCUTANEOUS
  Filled 2022-11-21: qty 1

## 2022-11-21 MED ORDER — TRANEXAMIC ACID-NACL 1000-0.7 MG/100ML-% IV SOLN
1000.0000 mg | INTRAVENOUS | Status: AC
  Administered 2022-11-21: 1000 mg via INTRAVENOUS

## 2022-11-21 MED ORDER — ACETAMINOPHEN 325 MG PO TABS: 325.0000 mg | ORAL_TABLET | Freq: Four times a day (QID) | ORAL | Status: DC | PRN

## 2022-11-21 MED ORDER — ACETAMINOPHEN 10 MG/ML IV SOLN
1000.0000 mg | Freq: Four times a day (QID) | INTRAVENOUS | Status: DC
  Administered 2022-11-21 – 2022-11-22 (×3): 1000 mg via INTRAVENOUS
  Filled 2022-11-21 (×4): qty 100

## 2022-11-21 MED ORDER — INSULIN ASPART 100 UNIT/ML IJ SOLN
INTRAMUSCULAR | Status: AC
  Filled 2022-11-21: qty 1

## 2022-11-21 MED ORDER — ENOXAPARIN SODIUM 30 MG/0.3ML IJ SOSY
30.0000 mg | PREFILLED_SYRINGE | Freq: Two times a day (BID) | INTRAMUSCULAR | Status: DC
  Administered 2022-11-22: 30 mg via SUBCUTANEOUS
  Filled 2022-11-21: qty 0.3

## 2022-11-21 MED ORDER — BISACODYL 10 MG RE SUPP: 10.0000 mg | Freq: Every day | RECTAL | Status: DC | PRN

## 2022-11-21 MED ORDER — OXYCODONE HCL 5 MG PO TABS
10.0000 mg | ORAL_TABLET | ORAL | Status: DC | PRN
  Administered 2022-11-21 – 2022-11-22 (×5): 10 mg via ORAL
  Filled 2022-11-21 (×5): qty 2

## 2022-11-21 MED ORDER — ACETAMINOPHEN 10 MG/ML IV SOLN: 1000.0000 mg | Freq: Once | INTRAVENOUS | Status: DC | PRN

## 2022-11-21 MED ORDER — ROCURONIUM BROMIDE 10 MG/ML (PF) SYRINGE
PREFILLED_SYRINGE | INTRAVENOUS | Status: AC
  Filled 2022-11-21: qty 10

## 2022-11-21 MED ORDER — ALUM & MAG HYDROXIDE-SIMETH 200-200-20 MG/5ML PO SUSP: 30.0000 mL | ORAL | Status: DC | PRN

## 2022-11-21 MED ORDER — OXYCODONE HCL 5 MG PO TABS: 5.0000 mg | ORAL_TABLET | Freq: Once | ORAL | Status: DC | PRN

## 2022-11-21 MED ORDER — MIDAZOLAM HCL 2 MG/2ML IJ SOLN
INTRAMUSCULAR | Status: AC
  Filled 2022-11-21: qty 2

## 2022-11-21 MED ORDER — PHENOL 1.4 % MT LIQD: 1.0000 | OROMUCOSAL | Status: DC | PRN

## 2022-11-21 MED ORDER — ONDANSETRON HCL 4 MG/2ML IJ SOLN
INTRAMUSCULAR | Status: DC | PRN
  Administered 2022-11-21: 4 mg via INTRAVENOUS

## 2022-11-21 MED ORDER — ONDANSETRON HCL 4 MG/2ML IJ SOLN: 4.0000 mg | Freq: Four times a day (QID) | INTRAMUSCULAR | Status: DC | PRN

## 2022-11-21 MED ORDER — SUCRALFATE 1 G PO TABS
1.0000 g | ORAL_TABLET | Freq: Two times a day (BID) | ORAL | Status: DC
  Administered 2022-11-21 – 2022-11-22 (×2): 1 g via ORAL
  Filled 2022-11-21 (×2): qty 1

## 2022-11-21 MED ORDER — TRANEXAMIC ACID-NACL 1000-0.7 MG/100ML-% IV SOLN
INTRAVENOUS | Status: AC
  Administered 2022-11-21: 1000 mg via INTRAVENOUS
  Filled 2022-11-21: qty 100

## 2022-11-21 MED ORDER — PROPOFOL 1000 MG/100ML IV EMUL
INTRAVENOUS | Status: AC
  Filled 2022-11-21: qty 100

## 2022-11-21 MED ORDER — PROPOFOL 10 MG/ML IV BOLUS
INTRAVENOUS | Status: DC | PRN
  Administered 2022-11-21: 20 mg via INTRAVENOUS

## 2022-11-21 MED ORDER — PROPOFOL 500 MG/50ML IV EMUL
INTRAVENOUS | Status: DC | PRN
  Administered 2022-11-21: 50 ug/kg/min via INTRAVENOUS

## 2022-11-21 MED ORDER — GLYCOPYRROLATE 0.2 MG/ML IJ SOLN
INTRAMUSCULAR | Status: DC | PRN
  Administered 2022-11-21: .2 mg via INTRAVENOUS

## 2022-11-21 MED ORDER — CHLORHEXIDINE GLUCONATE 4 % EX LIQD
60.0000 mL | Freq: Once | CUTANEOUS | Status: AC
  Administered 2022-11-21: 4 via TOPICAL

## 2022-11-21 MED ORDER — CELECOXIB 200 MG PO CAPS
200.0000 mg | ORAL_CAPSULE | Freq: Two times a day (BID) | ORAL | Status: DC
  Administered 2022-11-21 – 2022-11-22 (×2): 200 mg via ORAL
  Filled 2022-11-21 (×2): qty 1

## 2022-11-21 MED ORDER — SODIUM CHLORIDE (PF) 0.9 % IJ SOLN
INTRAMUSCULAR | Status: DC | PRN
  Administered 2022-11-21: 120 mL via INTRAMUSCULAR

## 2022-11-21 MED ORDER — FENTANYL CITRATE (PF) 100 MCG/2ML IJ SOLN
INTRAMUSCULAR | Status: AC
  Filled 2022-11-21: qty 2

## 2022-11-21 MED ORDER — ACETAMINOPHEN 10 MG/ML IV SOLN
INTRAVENOUS | Status: DC | PRN
  Administered 2022-11-21: 1000 mg via INTRAVENOUS

## 2022-11-21 MED ORDER — CEFAZOLIN SODIUM-DEXTROSE 2-4 GM/100ML-% IV SOLN
2.0000 g | INTRAVENOUS | Status: AC
  Administered 2022-11-21: 2 g via INTRAVENOUS

## 2022-11-21 SURGICAL SUPPLY — 72 items
ATTUNE MED DOME PAT 41 KNEE (Knees) IMPLANT
ATTUNE PS FEM LT SZ 6 CEM KNEE (Femur) IMPLANT
ATTUNE PSRP INSR SZ6 5 KNEE (Insert) IMPLANT
BASE TIBIA ATTUNE KNEE SYS SZ6 (Knees) IMPLANT
BATTERY INSTRU NAVIGATION (MISCELLANEOUS) ×4 IMPLANT
BLADE CLIPPER SURG (BLADE) IMPLANT
BLADE SAW 70X12.5 (BLADE) ×1 IMPLANT
BLADE SAW 90X13X1.19 OSCILLAT (BLADE) ×1 IMPLANT
BLADE SAW 90X25X1.19 OSCILLAT (BLADE) ×1 IMPLANT
BONE CEMENT GENTAMICIN (Cement) ×2 IMPLANT
CEMENT BONE GENTAMICIN 40 (Cement) IMPLANT
COOLER POLAR GLACIER W/PUMP (MISCELLANEOUS) ×1 IMPLANT
CUFF TOURN SGL QUICK 24 (TOURNIQUET CUFF)
CUFF TOURN SGL QUICK 34 (TOURNIQUET CUFF)
CUFF TRNQT CYL 24X4X16.5-23 (TOURNIQUET CUFF) IMPLANT
CUFF TRNQT CYL 34X4.125X (TOURNIQUET CUFF) IMPLANT
DRAPE 3/4 80X56 (DRAPES) ×1 IMPLANT
DRAPE INCISE IOBAN 66X45 STRL (DRAPES) IMPLANT
DRSG MEPILEX SACRM 8.7X9.8 (GAUZE/BANDAGES/DRESSINGS) ×1 IMPLANT
DRSG NON-ADHERENT DERMACEA 3X4 (GAUZE/BANDAGES/DRESSINGS) ×1 IMPLANT
DRSG OPSITE POSTOP 4X14 (GAUZE/BANDAGES/DRESSINGS) ×1 IMPLANT
DRSG TEGADERM 4X4.75 (GAUZE/BANDAGES/DRESSINGS) ×1 IMPLANT
DURAPREP 26ML APPLICATOR (WOUND CARE) ×2 IMPLANT
ELECT CAUTERY BLADE 6.4 (BLADE) ×1 IMPLANT
ELECT REM PT RETURN 9FT ADLT (ELECTROSURGICAL) ×1
ELECTRODE REM PT RTRN 9FT ADLT (ELECTROSURGICAL) ×1 IMPLANT
EX-PIN ORTHOLOCK NAV 4X150 (PIN) ×2 IMPLANT
GLOVE BIOGEL M STRL SZ7.5 (GLOVE) ×2 IMPLANT
GLOVE SRG 8 PF TXTR STRL LF DI (GLOVE) ×1 IMPLANT
GLOVE SURG UNDER POLY LF SZ8 (GLOVE) ×1
GOWN STRL REUS W/ TWL LRG LVL3 (GOWN DISPOSABLE) ×1 IMPLANT
GOWN STRL REUS W/TWL LRG LVL3 (GOWN DISPOSABLE) ×1
GOWN TOGA ZIPPER T7+ PEEL AWAY (MISCELLANEOUS) ×2 IMPLANT
HEMOVAC 400CC 10FR (MISCELLANEOUS) ×1 IMPLANT
HOLDER FOLEY CATH W/STRAP (MISCELLANEOUS) ×1 IMPLANT
HOOD PEEL AWAY T7 (MISCELLANEOUS) IMPLANT
IV NS IRRIG 3000ML ARTHROMATIC (IV SOLUTION) ×1 IMPLANT
JET LAVAGE IRRISEPT WOUND (IRRIGATION / IRRIGATOR) ×1
KIT TURNOVER KIT A (KITS) ×1 IMPLANT
KNIFE SCULPS 14X20 (INSTRUMENTS) ×1 IMPLANT
LAVAGE JET IRRISEPT WOUND (IRRIGATION / IRRIGATOR) IMPLANT
MANIFOLD NEPTUNE II (INSTRUMENTS) ×2 IMPLANT
NDL SPNL 20GX3.5 QUINCKE YW (NEEDLE) ×2 IMPLANT
NEEDLE SPNL 20GX3.5 QUINCKE YW (NEEDLE) ×2 IMPLANT
NS IRRIG 500ML POUR BTL (IV SOLUTION) ×1 IMPLANT
PACK TOTAL KNEE (MISCELLANEOUS) ×1 IMPLANT
PAD ABD DERMACEA PRESS 5X9 (GAUZE/BANDAGES/DRESSINGS) ×2 IMPLANT
PAD WRAPON POLAR KNEE (MISCELLANEOUS) ×1 IMPLANT
PIN DRILL FIX HALF THREAD (BIT) ×2 IMPLANT
PIN FIXATION 1/8DIA X 3INL (PIN) ×1 IMPLANT
PULSAVAC PLUS IRRIG FAN TIP (DISPOSABLE) ×1
SOL PREP PVP 2OZ (MISCELLANEOUS) ×1
SOLUTION IRRIG SURGIPHOR (IV SOLUTION) ×1 IMPLANT
SOLUTION PREP PVP 2OZ (MISCELLANEOUS) ×1 IMPLANT
SPONGE DRAIN TRACH 4X4 STRL 2S (GAUZE/BANDAGES/DRESSINGS) ×1 IMPLANT
STAPLER SKIN PROX 35W (STAPLE) ×1 IMPLANT
STOCKINETTE IMPERV 14X48 (MISCELLANEOUS) ×1 IMPLANT
STRAP TIBIA SHORT (MISCELLANEOUS) ×1 IMPLANT
SUCTION FRAZIER HANDLE 10FR (MISCELLANEOUS) ×1
SUCTION TUBE FRAZIER 10FR DISP (MISCELLANEOUS) ×1 IMPLANT
SUT VIC AB 0 CT1 36 (SUTURE) ×1 IMPLANT
SUT VIC AB 1 CT1 36 (SUTURE) ×2 IMPLANT
SUT VIC AB 2-0 CT2 27 (SUTURE) ×1 IMPLANT
SYR 30ML LL (SYRINGE) ×2 IMPLANT
TIBIA ATTUNE KNEE SYS BASE SZ6 (Knees) ×1 IMPLANT
TIP FAN IRRIG PULSAVAC PLUS (DISPOSABLE) ×1 IMPLANT
TOWEL OR 17X26 4PK STRL BLUE (TOWEL DISPOSABLE) IMPLANT
TOWER CARTRIDGE SMART MIX (DISPOSABLE) ×1 IMPLANT
TRAP FLUID SMOKE EVACUATOR (MISCELLANEOUS) ×1 IMPLANT
TRAY FOLEY MTR SLVR 16FR STAT (SET/KITS/TRAYS/PACK) ×1 IMPLANT
WATER STERILE IRR 1000ML POUR (IV SOLUTION) ×1 IMPLANT
WRAPON POLAR PAD KNEE (MISCELLANEOUS) ×1

## 2022-11-21 NOTE — Progress Notes (Signed)
Patient converted from albuterol inhaler prescription, to albuterol nebulization prescription, per therapeutic interchange authorized by P&T.

## 2022-11-21 NOTE — Progress Notes (Signed)
Patient is not able to walk the distance required to go the bathroom, or he/she is unable to safely negotiate stairs required to access the bathroom.  A 3in1 BSC will alleviate this problem  Shoua Ressler P. Solash Tullo, Jr. M.D.  

## 2022-11-21 NOTE — Op Note (Signed)
OPERATIVE NOTE  DATE OF SURGERY:  11/21/2022  PATIENT NAME:  MARCIN HOLTE   DOB: [birth year]  MRN: [id number]  PRE-OPERATIVE DIAGNOSIS: Degenerative arthrosis of the left knee, primary  POST-OPERATIVE DIAGNOSIS:  Same  PROCEDURE:  Left total knee arthroplasty using computer-assisted navigation  SURGEON:  Marciano Sequin. M.D.  ANESTHESIA: spinal  ESTIMATED BLOOD LOSS: 50 mL  FLUIDS REPLACED: 1000 mL of crystalloid  TOURNIQUET TIME: 68 minutes  DRAINS: 2 medium Hemovac drains  SOFT TISSUE RELEASES: Anterior cruciate ligament, posterior cruciate ligament, deep medial collateral ligament, patellofemoral ligament  IMPLANTS UTILIZED: DePuy Attune size 6 posterior stabilized femoral component (cemented), size 6 rotating platform tibial component (cemented), 41 mm medialized dome patella (cemented), and a 5 mm stabilized rotating platform polyethylene insert.  INDICATIONS FOR SURGERY: THAILAND DUBE is a 67 y.o. year old male with a long history of progressive knee pain. X-rays demonstrated severe degenerative changes in tricompartmental fashion. The patient had not seen any significant improvement despite conservative nonsurgical intervention. After discussion of the risks and benefits of surgical intervention, the patient expressed understanding of the risks benefits and agree with plans for total knee arthroplasty.   The risks, benefits, and alternatives were discussed at length including but not limited to the risks of infection, bleeding, nerve injury, stiffness, blood clots, the need for revision surgery, cardiopulmonary complications, among others, and they were willing to proceed.  PROCEDURE IN DETAIL: The patient was brought into the operating room and, after adequate spinal anesthesia was achieved, a tourniquet was placed on the patient's upper thigh. The patient's knee and leg were cleaned and prepped with alcohol and DuraPrep and draped in the usual sterile fashion. A "timeout"  was performed as per usual protocol. The lower extremity was exsanguinated using an Esmarch, and the tourniquet was inflated to 300 mmHg. An anterior longitudinal incision was made followed by a standard mid vastus approach. The deep fibers of the medial collateral ligament were elevated in a subperiosteal fashion off of the medial flare of the tibia so as to maintain a continuous soft tissue sleeve. The patella was subluxed laterally and the patellofemoral ligament was incised. Inspection of the knee demonstrated severe degenerative changes with full-thickness loss of articular cartilage. Osteophytes were debrided using a rongeur. Anterior and posterior cruciate ligaments were excised. Two 4.0 mm Schanz pins were inserted in the femur and into the tibia for attachment of the array of trackers used for computer-assisted navigation. Hip center was identified using a circumduction technique. Distal landmarks were mapped using the computer. The distal femur and proximal tibia were mapped using the computer. The distal femoral cutting guide was positioned using computer-assisted navigation so as to achieve a 5 distal valgus cut. The femur was sized and it was felt that a size 6 femoral component was appropriate. A size 6 femoral cutting guide was positioned and the anterior cut was performed and verified using the computer. This was followed by completion of the posterior and chamfer cuts. Femoral cutting guide for the central box was then positioned in the center box cut was performed.  Attention was then directed to the proximal tibia. Medial and lateral menisci were excised. The extramedullary tibial cutting guide was positioned using computer-assisted navigation so as to achieve a 0 varus-valgus alignment and 3 posterior slope. The cut was performed and verified using the computer. The proximal tibia was sized and it was felt that a size 6 tibial tray was appropriate. Tibial and femoral trials were inserted  followed  by insertion of a 5 mm polyethylene insert. This allowed for excellent mediolateral soft tissue balancing both in flexion and in full extension. Finally, the patella was cut and prepared so as to accommodate a 41 mm medialized dome patella. A patella trial was placed and the knee was placed through a range of motion with excellent patellar tracking appreciated. The femoral trial was removed after debridement of posterior osteophytes. The central post-hole for the tibial component was reamed followed by insertion of a keel punch. Tibial trials were then removed. Cut surfaces of bone were irrigated with copious amounts of normal saline using pulsatile lavage and then suctioned dry. Polymethylmethacrylate cement with gentamicin was prepared in the usual fashion using a vacuum mixer. Cement was applied to the cut surface of the proximal tibia as well as along the undersurface of a size 6 rotating platform tibial component. Tibial component was positioned and impacted into place. Excess cement was removed using [employer] fast streamer. Cement was then applied to the cut surfaces of the femur as well as along the posterior flanges of the size 6 femoral component. The femoral component was positioned and impacted into place. Excess cement was removed using [employer] fast streamer. A 5 mm polyethylene trial was inserted and the knee was brought into full extension with steady axial compression applied. Finally, cement was applied to the backside of a 41 mm medialized dome patella and the patellar component was positioned and patellar clamp applied. Excess cement was removed using [employer] fast streamer. After adequate curing of the cement, the tourniquet was deflated after a total tourniquet time of 68 minutes. Hemostasis was achieved using electrocautery. The knee was irrigated with copious amounts of normal saline using pulsatile lavage followed by 450 ml of Surgiphor and then suctioned dry. 20 mL of 1.3% Exparel and 60 mL of 0.25%  Marcaine in 40 mL of normal saline was injected along the posterior capsule, medial and lateral gutters, and along the arthrotomy site. A 5 mm stabilized rotating platform polyethylene insert was inserted and the knee was placed through a range of motion with excellent mediolateral soft tissue balancing appreciated and excellent patellar tracking noted. 2 medium drains were placed in the wound bed and brought out through separate stab incisions. The medial parapatellar portion of the incision was reapproximated using interrupted sutures of #1 Vicryl. Subcutaneous tissue was approximated in layers using first #0 Vicryl followed #2-0 Vicryl. The skin was approximated with skin staples. A sterile dressing was applied.  The patient tolerated the procedure well and was transported to the recovery room in stable condition.    Orpheus [place]hurst P. Holley Bouche., M.D.

## 2022-11-21 NOTE — Plan of Care (Signed)

## 2022-11-21 NOTE — Transfer of Care (Signed)
Immediate Anesthesia Transfer of Care Note  Patient: [name]  Procedure(s) Performed: COMPUTER ASSISTED TOTAL KNEE ARTHROPLASTY (Left: Knee)  Patient Location: PACU  Anesthesia Type:Spinal  Level of Consciousness: awake, alert , and oriented  Airway & Oxygen Therapy: Patient Spontanous Breathing  Post-op Assessment: Report given to RN and Post -op Vital signs reviewed and stable  Post vital signs: Reviewed and stable  Last Vitals:  Vitals Value Taken Time  BP 104/53 11/21/22 1512  Temp 98.3   Pulse 64 11/21/22 1515  Resp 19 11/21/22 1515  SpO2 94 % 11/21/22 1515  Vitals shown include unvalidated device data.  Last Pain:  Vitals:   11/21/22 1038  TempSrc: Temporal  PainSc: 10-Worst pain ever         Complications: No notable events documented.

## 2022-11-21 NOTE — Anesthesia Preprocedure Evaluation (Signed)
Anesthesia Evaluation  Patient identified by MRN, date of birth, ID band Patient awake    Reviewed: Allergy & Precautions, NPO status , Patient's Chart, lab work & pertinent test results  History of Anesthesia Complications (+) PONV and history of anesthetic complications  Airway Mallampati: II  TM Distance: >3 FB Neck ROM: Full    Dental no notable dental hx. (+) Teeth Intact   Pulmonary asthma , neg sleep apnea, neg COPD, Patient abstained from smoking.Not current smoker, former smoker   Pulmonary exam normal breath sounds clear to auscultation       Cardiovascular Exercise Tolerance: Good METS(-) hypertension(-) CAD and (-) Past MI negative cardio ROS (-) dysrhythmias  Rhythm:Regular Rate:Normal - Systolic murmurs    Neuro/Psych negative neurological ROS  negative psych ROS   GI/Hepatic ,GERD  Medicated and Controlled,,(+)     (-) substance abuse    Endo/Other  diabetes  Patient has held his GLP1 victoza for 1.5 weeks. Denies GI symptoms today  Renal/GU negative Renal ROS     Musculoskeletal   Abdominal   Peds  Hematology   Anesthesia Other Findings Past Medical History: No date: Arthritis No date: Colon polyps No date: Diabetes mellitus without complication (HCC) No date: Dysrhythmia No date: GERD (gastroesophageal reflux disease) No date: Heart murmur     Comment:  with rheumatic fever as a child No date: History of kidney stones No date: Irregular heart rhythm No date: Kidney stone No date: Meniscus tear     Comment:  left knee No date: Pneumonia No date: Pneumothorax No date: Pneumothorax No date: PONV (postoperative nausea and vomiting) No date: Rheumatic fever  Reproductive/Obstetrics                              Anesthesia Physical Anesthesia Plan  ASA: 2  Anesthesia Plan: Spinal   Post-op Pain Management: Celebrex PO (pre-op)*, Gabapentin PO (pre-op)* and  Ofirmev IV (intra-op)*   Induction: Intravenous  PONV Risk Score and Plan: 2 and Ondansetron, Dexamethasone, Propofol infusion, TIVA and Midazolam  Airway Management Planned: Natural Airway  Additional Equipment: None  Intra-op Plan:   Post-operative Plan:   Informed Consent: I have reviewed the patients History and Physical, chart, labs and discussed the procedure including the risks, benefits and alternatives for the proposed anesthesia with the patient or authorized representative who has indicated his/her understanding and acceptance.       Plan Discussed with: CRNA and Surgeon  Anesthesia Plan Comments: (Discussed R/B/A of neuraxial anesthesia technique with patient: - rare risks of spinal/epidural hematoma, nerve damage, infection - Risk of PDPH - Risk of nausea and vomiting - Risk of conversion to general anesthesia and its associated risks, including sore throat, damage to lips/eyes/teeth/oropharynx, and rare risks such as cardiac and respiratory events. - Risk of allergic reactions  Discussed the role of CRNA in patient's perioperative care.  Patient voiced understanding.)         Anesthesia Quick Evaluation

## 2022-11-21 NOTE — Anesthesia Procedure Notes (Signed)
Date/Time: 11/21/2022 12:34 PM  Performed by: Demetrius Charity, CRNAPre-anesthesia Checklist: Patient identified, Emergency Drugs available, Suction available, Patient being monitored and Timeout performed Patient Re-evaluated:Patient Re-evaluated prior to induction Oxygen Delivery Method: Simple face mask Placement Confirmation: CO2 detector and positive ETCO2

## 2022-11-21 NOTE — Progress Notes (Signed)
PT Cancellation Note  Patient Details Name: LAWARENCE MEEK MRN: [id number] DOB: Aug 04, 1956   Cancelled Treatment:    Reason Eval/Treat Not Completed: Patient not medically ready: Per conversation with nursing pt continues to present with deficits in strength and sensation and is not appropriate for participation with PT services at this time.  Will attempt to see pt at a future date as medically appropriate.    Linus Salmons PT, DPT 11/21/22, 4:30 PM

## 2022-11-21 NOTE — Anesthesia Procedure Notes (Signed)
Spinal  Patient location during procedure: OR Start time: 11/21/2022 12:16 PM End time: 11/21/2022 12:18 PM Reason for block: surgical anesthesia Staffing Performed: resident/CRNA  Resident/CRNA: Demetrius Charity, CRNA Performed by: Demetrius Charity, CRNA Authorized by: Arita Miss, MD   Preanesthetic Checklist Completed: patient identified, IV checked, site marked, risks and benefits discussed, surgical consent, monitors and equipment checked, pre-op evaluation and timeout performed Spinal Block Patient position: sitting Prep: ChloraPrep Patient monitoring: heart rate, continuous pulse ox, blood pressure and cardiac monitor Approach: midline Location: L4-5 Injection technique: single-shot Needle Needle type: Whitacre and Introducer  Needle gauge: 25 G Needle length: 9 cm Assessment Sensory level: T10 Events: CSF return Additional Notes Sterile aseptic technique used throughout the procedure.  Negative paresthesia. Negative blood return. Positive free-flowing CSF. Expiration date of kit checked and confirmed. Patient tolerated procedure well, without complications.

## 2022-11-21 NOTE — Interval H&P Note (Signed)
History and Physical Interval Note:  11/21/2022 11:34 AM  [name]  has presented today for surgery, with the diagnosis of PRIMARY OSTEOARTHRITIS OF LEFT KNEE..  The various methods of treatment have been discussed with the patient and family. After consideration of risks, benefits and other options for treatment, the patient has consented to  Procedure(s): COMPUTER ASSISTED TOTAL KNEE ARTHROPLASTY - RNFA (Left) as a surgical intervention.  The patient's history has been reviewed, patient examined, no change in status, stable for surgery.  I have reviewed the patient's chart and labs.  Questions were answered to the patient's satisfaction.     [place]

## 2022-11-22 DIAGNOSIS — M1712 Unilateral primary osteoarthritis, left knee: Secondary | ICD-10-CM | POA: Diagnosis not present

## 2022-11-22 DIAGNOSIS — Z96659 Presence of unspecified artificial knee joint: Secondary | ICD-10-CM | POA: Diagnosis not present

## 2022-11-22 LAB — GLUCOSE, CAPILLARY
Glucose-Capillary: 271 mg/dL — ABNORMAL HIGH (ref 70–99)
Glucose-Capillary: 235 mg/dL — ABNORMAL HIGH (ref 70–99)

## 2022-11-22 MED ORDER — ONDANSETRON HCL 4 MG PO TABS: 4.0000 mg | ORAL_TABLET | Freq: Four times a day (QID) | ORAL | 0 refills | Status: AC | PRN

## 2022-11-22 MED ORDER — CELECOXIB 200 MG PO CAPS: 200.0000 mg | ORAL_CAPSULE | Freq: Two times a day (BID) | ORAL | 0 refills | Status: AC

## 2022-11-22 MED ORDER — ENOXAPARIN SODIUM 40 MG/0.4ML IJ SOSY: 40.0000 mg | PREFILLED_SYRINGE | INTRAMUSCULAR | 0 refills | Status: DC

## 2022-11-22 MED ORDER — ACETAMINOPHEN 500 MG PO TABS: 1000.0000 mg | ORAL_TABLET | Freq: Four times a day (QID) | ORAL | 0 refills | Status: AC | PRN

## 2022-11-22 MED ORDER — OXYCODONE HCL 5 MG PO TABS: 5.0000 mg | ORAL_TABLET | ORAL | 0 refills | Status: DC | PRN

## 2022-11-22 NOTE — TOC Transition Note (Signed)
Transition of Care [facility]) - CM/SW Discharge Note   Patient Details  Name: [name] MRN: [id number] Date of Birth: April 24, 1956  [facility] of [facility]) CM/SW Contact:  Izola Price, RN Phone Number: 11/22/2022, 11:24 AM   Clinical Narrative:  2/3: Discharge pending to after PT works with patient today. DME ordered via [employer]. To be delivered to room. Spoke to patient who asked me to speak to spouse. Confirmed that they did not have any ordered DME and that [facility] was [facility] post discharge.Narrative placed for DME/Insurance.  No other questions regarding discharge plan. Simmie Davies RN CM      Final next level of care: Home w Home Health Services Barriers to Discharge: Barriers Resolved   Patient Goals and CMS Choice      Discharge Placement                         Discharge Plan and Services Additional resources added to the After Visit Summary for                  DME Arranged: 3-N-1, Walker rolling DME Agency: [employer] Date DME Agency Contacted: 11/22/22 Time DME Agency Contacted: 73 Representative spoke with at DME Agency: [place]: PT [place]: [place]   Time [place]:  ([place] in office PTA. Notified of discharge pending for today.)    Social Determinants of Health (SDOH) Interventions SDOH Screenings   Food Insecurity: No Food Insecurity (11/21/2022)  Housing: Low Risk  (11/21/2022)  Transportation Needs: No Transportation Needs (11/21/2022)  Utilities: Not At Risk (11/21/2022)  Depression (PHQ2-9): Low Risk  (12/20/2020)  Financial Resource Strain: Low Risk  (11/19/2020)  Physical Activity: Sufficiently Active (11/19/2020)  Stress: No Stress Concern Present (11/19/2020)  Tobacco Use: Medium Risk (11/21/2022)     Readmission Risk Interventions     No data to display

## 2022-11-22 NOTE — Evaluation (Signed)
Occupational Therapy Evaluation Patient Details Name: [name] MRN: [id number] DOB: 07/24/1956 Today's Date: 11/22/2022   History of Present Illness Pt admitted for L TKR. HIstory includes GERD, HTN, and DM. Pt is POD 1 at time of evaluation   Clinical Impression   Patient seen for OT evaluation, spouse present. Pt presenting with decreased independence in self care, balance, functional mobility/transfers, and endurance. PTA pt was independent for ADLs, IADLs, and functional mobility without an AD. Pt/spouse were educated on polar care system, precautions for LLE WBAT, and no pillow under L knee. Education and demonstration was provided for LB dressing AE including reacher and sock aid with good carryover. Pt required Min A to don/doff socks and mesh underwear using AE. He required Min guard for simulated toilet transfer using RW. Pt endorsing 12/10 pain during evaluation, RN present to give pain meds. Pt will benefit from acute OT to increase overall independence in the areas of ADLs and functional mobility in order to safely discharge home. Pt could benefit from [facility] following D/C to decrease falls risk, improve balance, and maximize independence in self-care within own home environment.    Recommendations for follow up therapy are one component of a multi-disciplinary discharge planning process, led by the attending physician.  Recommendations may be updated based on patient status, additional functional criteria and insurance authorization.   Follow Up Recommendations  Home health OT     Assistance Recommended at Discharge Intermittent Supervision/Assistance  Patient can return home with the following A little help with walking and/or transfers;A little help with bathing/dressing/bathroom;Assistance with cooking/housework;Assist for transportation;Help with stairs or ramp for entrance    Functional Status Assessment  Patient has had a recent decline in their functional status and  demonstrates the ability to make significant improvements in function in a reasonable and predictable amount of time.  Equipment Recommendations  BSC/3in1;Other (comment) Management consultant)    Recommendations for Other Services       Precautions / Restrictions Precautions Precautions: Fall;Knee Precaution Booklet Issued: Yes (comment) Restrictions Weight Bearing Restrictions: Yes LLE Weight Bearing: Weight bearing as tolerated      Mobility Bed Mobility               General bed mobility comments: NT, pt received/left in recliner    Transfers Overall transfer level: Needs assistance Equipment used: Rolling walker (2 wheels) Transfers: Sit to/from Stand Sit to Stand: Min guard                  Balance Overall balance assessment: Needs assistance Sitting-balance support: Feet supported Sitting balance-Leahy Scale: Good     Standing balance support: Bilateral upper extremity supported, During functional activity Standing balance-Leahy Scale: Fair                             ADL either performed or assessed with clinical judgement   ADL Overall ADL's : Needs assistance/impaired     Grooming: Min guard;Standing Grooming Details (indicate cue type and reason): anticipate             Lower Body Dressing: With adaptive equipment;Minimal assistance;Sitting/lateral leans;Cueing for compensatory techniques Lower Body Dressing Details (indicate cue type and reason): use of reacher and sock aid to don/doff socks and mesh underwear; utilized "first in, last out" technique Toilet Transfer: Rolling walker (2 wheels);Min guard Armed forces technical officer Details (indicate cue type and reason): simulated Toileting- Clothing Manipulation and Hygiene: Minimal assistance;Sit to/from stand Electrical engineer  Details (indicate cue type and reason): anticipate             Vision Baseline Vision/History: 1 Wears glasses (reading only) Patient Visual Report:  No change from baseline       Perception     Praxis      Pertinent Vitals/Pain Pain Assessment Pain Assessment: 0-10 Pain Score:  (12) Pain Location: L knee Pain Descriptors / Indicators: Operative site guarding, Grimacing, Sharp, Shooting Pain Intervention(s): Limited activity within patient's tolerance, Monitored during session, Ice applied, RN gave pain meds during session     Hand Dominance Right   Extremity/Trunk Assessment Upper Extremity Assessment Upper Extremity Assessment: Overall WFL for tasks assessed   Lower Extremity Assessment Lower Extremity Assessment: LLE deficits/detail LLE Deficits / Details: s/p TKR       Communication Communication Communication: No difficulties   Cognition Arousal/Alertness: Awake/alert Behavior During Therapy: WFL for tasks assessed/performed Overall Cognitive Status: Within Functional Limits for tasks assessed                                       General Comments       Exercises Other Exercises Other Exercises: OT provided education re: role of OT, OT POC, post acute recs, sitting up for all meals, EOB/OOB mobility with assistance, home/fall safety, LLE WBAT, no pillow under L knee, LB dressing AE (reacher, sock aid), adaptive strategies for ADLs (completing dressing/bathing in sitting)   Shoulder Instructions      Home Living Family/patient expects to be discharged to:: Private residence Living Arrangements: Spouse/significant other Available Help at Discharge: Family;Available 24 hours/day Type of Home: House Home Access: Stairs to enter [employer] of Steps: 3 Entrance Stairs-Rails: Can reach both Home Layout: One level     Bathroom Shower/Tub: Teacher, early years/pre: Handicapped height     Home Equipment: Grab bars - tub/shower;Hand held shower head;Shower seat          Prior Functioning/Environment Prior Level of Function : Independent/Modified Independent;Driving              Mobility Comments: indep with all mobility. Reports no recent falls ADLs Comments: IND for ADLs/IADLs, still driving        OT Problem List: Decreased strength;Decreased activity tolerance;Impaired balance (sitting and/or standing);Pain      OT Treatment/Interventions: Self-care/ADL training;Therapeutic activities;Therapeutic exercise;Energy conservation;DME and/or AE instruction;Patient/family education;Balance training    OT Goals(Current goals can be found in the care plan section) Acute Rehab OT Goals Patient Stated Goal: reduce pain, go home OT Goal Formulation: With patient/family Time For Goal Achievement: 12/06/22 Potential to Achieve Goals: Good   OT Frequency: Min 2X/week    Co-evaluation              AM-PAC OT "6 Clicks" Daily Activity     Outcome Measure Help from another person eating meals?: None Help from another person taking care of personal grooming?: A Little Help from another person toileting, which includes using toliet, bedpan, or urinal?: A Little Help from another person bathing (including washing, rinsing, drying)?: A Little Help from another person to put on and taking off regular upper body clothing?: None Help from another person to put on and taking off regular lower body clothing?: A Little 6 Click Score: 20   End of Session Equipment Utilized During Treatment: Gait belt;Rolling walker (2 wheels) Nurse Communication: Mobility status  Activity Tolerance: Patient tolerated  treatment well;Patient limited by pain Patient left: in chair;with call bell/phone within reach;with chair alarm set;with family/visitor present  OT Visit Diagnosis: Other abnormalities of gait and mobility (R26.89);Muscle weakness (generalized) (M62.81);Pain Pain - Right/Left: Left Pain - part of body: Knee                Time: 6237-6283 OT Time Calculation (min): 21 min Charges:  OT General Charges $OT Visit: 1 Visit OT Evaluation $OT Eval Low  Complexity: 1 Low  [facility] MS, OTR/L ascom [phone]  11/22/22, 1:24 PM

## 2022-11-22 NOTE — Discharge Summary (Signed)
Physician Discharge Summary  Patient ID: [name] MRN: [id number] DOB/AGE: 1956-04-13 67 y.o.  Admit date: 11/21/2022 Discharge date: 11/22/2022  Admission Diagnoses:  Total knee replacement status [Z96.659] Primary osteoarthritis of the left knee  Discharge Diagnoses: Patient Active Problem List   Diagnosis Date Noted   Total knee replacement status 11/21/2022   Primary osteoarthritis of left knee 09/21/2022   Mild cognitive impairment 12/30/2021   History of 2019 novel coronavirus disease (COVID-19) 07/23/2021   Mild intermittent asthma without complication 32/99/2426   Aortic atherosclerosis ([place]) 01/09/2021   2019 novel coronavirus-infected pneumonia ([facility]) 10/22/2020   COVID-19 10/16/2020   Nonexudative age-related macular degeneration, bilateral, early dry stage 03/30/2019   Presbyopia 03/30/2019   Regular astigmatism of both eyes 03/30/2019   Hyperlipidemia associated with type 2 diabetes mellitus ([place]) 03/09/2019   Type 2 diabetes mellitus without complication, without long-term current use of insulin ([place]) 03/09/2019   Stress at work 04/26/2018   Insomnia due to stress 07/29/2016   Diabetes mellitus associated with hormonal etiology ([place]) 08/01/2015   Hyperlipemia 08/01/2015    Past Medical History:  Diagnosis Date   Arthritis    Colon polyps    Diabetes mellitus without complication ([place])    Dysrhythmia    GERD (gastroesophageal reflux disease)    Heart murmur    with rheumatic fever as a child   History of kidney stones    Irregular heart rhythm    Kidney stone    Meniscus tear    left knee   Pneumonia    Pneumothorax    Pneumothorax    PONV (postoperative nausea and vomiting)    Rheumatic fever      Transfusion: None.   Consultants (if any):   Discharged Condition: Improved  Hospital Course: [name] is an 67 y.o. male who was admitted 11/21/2022 with a diagnosis of primary osteoarthritis of the left knee and went to the operating room  on 11/21/2022 and underwent the above named procedures.    Surgeries: Procedure(s): COMPUTER ASSISTED TOTAL KNEE ARTHROPLASTY on 11/21/2022 Patient tolerated the surgery well. Taken to PACU where she was stabilized and then transferred to the orthopedic floor.  Started on Lovenox 30mg  q 12 hrs. SCDs applied bilaterally. Heels elevated on bed with rolled towels. No evidence of DVT. Negative [place]. Physical therapy started on day #1 for gait training and transfer. OT started day #1 for ADL and assisted devices.  Patient's IV was removed on POD1.  Foley removed shortly after surgery and Hemovac was also removed on POD1.  Implants: DePuy Attune size 6 posterior stabilized femoral component (cemented), size 6 rotating platform tibial component (cemented), 41 mm medialized dome patella (cemented), and a 5 mm stabilized rotating platform polyethylene insert.   He was given perioperative antibiotics:  Anti-infectives (From admission, onward)    Start     Dose/Rate Route Frequency Ordered Stop   11/21/22 1800  ceFAZolin (ANCEF) IVPB 2g/100 mL premix        2 g 200 mL/hr over 30 Minutes Intravenous Every 6 hours 11/21/22 1714 11/21/22 2355   11/21/22 1115  ceFAZolin (ANCEF) IVPB 2g/100 mL premix        2 g 200 mL/hr over 30 Minutes Intravenous On call to O.R. 11/21/22 1101 11/21/22 1252   11/21/22 1045  ceFAZolin (ANCEF) 2-4 GM/100ML-% IVPB       Note to Pharmacy: [name]: cabinet override      11/21/22 1045 11/21/22 1231     .  He was given sequential compression devices, early ambulation, and Lovenox for DVT prophylaxis.  He benefited maximally from the hospital stay and there were no complications.    Recent vital signs:  Vitals:   11/22/22 0416 11/22/22 0816  BP: (!) 107/53 125/68  Pulse: (!) 56 (!) 59  Resp: 20 17  Temp: 97.8 F (36.6 C) (!) 97.5 F (36.4 C)  SpO2: 96% 96%    Recent laboratory studies:  Lab Results  Component Value Date   HGB 15.4 11/11/2022   HGB  14.0 11/30/2020   HGB 15.6 08/31/2020   Lab Results  Component Value Date   WBC 5.3 11/11/2022   PLT 173 11/11/2022   No results found for: "INR" Lab Results  Component Value Date   NA 138 11/11/2022   K 3.8 11/11/2022   CL 104 11/11/2022   CO2 26 11/11/2022   BUN 18 11/11/2022   CREATININE 1.11 11/11/2022   GLUCOSE 150 (H) 11/11/2022    Discharge Medications:   Allergies as of 11/22/2022   No Known Allergies      Medication List     STOP taking these medications    aspirin 81 MG chewable tablet   traMADol 50 MG tablet Commonly known as: ULTRAM       TAKE these medications    Accu-Chek Aviva Plus w/Device Kit Use kit to check sugar daily   acetaminophen 500 MG tablet Commonly known as: TYLENOL Take 2 tablets (1,000 mg total) by mouth every 6 (six) hours as needed.   albuterol 108 (90 Base) MCG/ACT inhaler Commonly known as: VENTOLIN HFA Inhale 2 puffs into the lungs every 6 (six) hours as needed for wheezing or shortness of breath.   atorvastatin 40 MG tablet Commonly known as: LIPITOR Take 1 tablet (40 mg total) by mouth daily.   benzonatate 200 MG capsule Commonly known as: TESSALON Take 1 capsule (200 mg total) by mouth 2 (two) times daily as needed for cough.   celecoxib 200 MG capsule Commonly known as: CELEBREX Take 1 capsule (200 mg total) by mouth 2 (two) times daily.   clonazePAM 1 MG tablet Commonly known as: KLONOPIN Take 1 tablet (1 mg total) by mouth daily as needed for anxiety.   donepezil 10 MG tablet Commonly known as: ARICEPT Take 10 mg by mouth at bedtime.   enoxaparin 40 MG/0.4ML injection Commonly known as: LOVENOX Inject 0.4 mLs (40 mg total) into the skin daily.   FreeStyle Libre 2 Reader Rogers Use to monitor blood glucose.   Jardiance 25 MG Tabs tablet Generic drug: empagliflozin TAKE ONE TABLET BY MOUTH EVERY MORNING   Melatonin 10 MG Tabs Take 1 tablet by mouth.   metFORMIN 500 MG tablet Commonly known as:  GLUCOPHAGE TAKE TWO TABLETS BY MOUTH AT BREAKFAST AND AT BEDTIME What changed: See the new instructions.   MULTIVITAMIN ADULT PO Take by mouth.   omeprazole 40 MG capsule Commonly known as: PRILOSEC Take 1 capsule (40 mg total) by mouth in the morning and at bedtime.   ondansetron 4 MG tablet Commonly known as: ZOFRAN Take 1 tablet (4 mg total) by mouth every 6 (six) hours as needed for nausea.   ONE TOUCH ULTRA TEST test strip Generic drug: glucose blood USE TO CHECK BLOOD SUGAR LEVELS THREE TIMES DAILY. DX:E11.9   oxyCODONE 5 MG immediate release tablet Commonly known as: Oxy IR/ROXICODONE Take 1-2 tablets (5-10 mg total) by mouth every 4 (four) hours as needed for moderate pain (pain score 4-6).  simethicone 125 MG chewable tablet Commonly known as: MYLICON Chew 627 mg by mouth every 6 (six) hours as needed for flatulence.   sucralfate 1 g tablet Commonly known as: Carafate Take 1 tablet (1 g total) by mouth 2 (two) times daily.   Trulicity 0.35 KK/9.3GH Sopn Generic drug: Dulaglutide Inject once a week What changed:  how much to take how to take this when to take this additional instructions   VITAMIN D-3 PO Take 5,000 Units/day by mouth daily.   vitamin E 180 MG (400 UNITS) capsule Take 400 Units by mouth daily.   Zinc 50 MG Tabs Take 50 mg by mouth daily.               Durable Medical Equipment  (From admission, onward)           Start     Ordered   11/21/22 1715  DME Walker rolling  Once       Question:  Patient needs a walker to treat with the following condition  Answer:  Total knee replacement status   11/21/22 1714   11/21/22 1715  DME Bedside commode  Once       Comments: Patient is not able to walk the distance required to go the bathroom, or he/she is unable to safely negotiate stairs required to access the bathroom.  A 3in1 BSC will alleviate this problem  Question:  Patient needs a bedside commode to treat with the following  condition  Answer:  Total knee replacement status   11/21/22 1714            Diagnostic Studies: DG Knee Left Port  Result Date: 11/21/2022 CLINICAL DATA:  Left knee arthroplasty EXAM: PORTABLE LEFT KNEE - 1-2 VIEW COMPARISON:  None Available. FINDINGS: Expected postsurgical changes from a left knee arthroplasty including small volume subcutaneous air. There are skin staples and a surgical drain surrounding the surgical site. No fracture. IMPRESSION: Expected postsurgical changes from left knee arthroplasty. Surgical drain in place. Electronically Signed   By: Marin Roberts M.D.   On: 11/21/2022 15:39    Disposition: Plan for discharge home this afternoon pending progress with therapy and completion of stair training.     Follow-up Information     Watt Climes, PA Follow up on 12/08/2022.   Specialty: Physician Assistant Why: at 8:45am Contact information: [place] [address] [phone]         Dereck Leep, MD Follow up on 01/01/2023.   Specialty: Orthopedic Surgery Why: at 2:45pm Contact information: [place] [address] [phone]                Signed: Judson Roch PA-C 11/22/2022, 9:39 AM

## 2022-11-22 NOTE — Progress Notes (Signed)
Physical Therapy Treatment Patient Details Name: [name] MRN: [id number] DOB: September 29, 1956 Today's Date: 11/22/2022   History of Present Illness Pt admitted for L TKR. HIstory includes GERD, HTN, and DM. Pt is POD 1 at time of evaluation    PT Comments    Session performed with pt able to ambulate in hallway safely as well as navigate stair training. Safe technique with improvement in gait fluid pattern. Great tolerance with ROM. Safe to dc this date. Updated via secure chat.    Recommendations for follow up therapy are one component of a multi-disciplinary discharge planning process, led by the attending physician.  Recommendations may be updated based on patient status, additional functional criteria and insurance authorization.  Follow Up Recommendations  Home health PT     Assistance Recommended at Discharge Intermittent Supervision/Assistance  Patient can return home with the following A little help with walking and/or transfers;A little help with bathing/dressing/bathroom;Help with stairs or ramp for entrance   Equipment Recommendations  Rolling walker (2 wheels);BSC/3in1    Recommendations for Other Services       Precautions / Restrictions Precautions Precautions: Fall;Knee Precaution Booklet Issued: Yes (comment) Restrictions Weight Bearing Restrictions: Yes LLE Weight Bearing: Weight bearing as tolerated     Mobility  Bed Mobility Overal bed mobility: Needs Assistance Bed Mobility: Supine to Sit     Supine to sit: Modified independent (Device/Increase time)     General bed mobility comments: safe technique. Uses bed rail for assistance    Transfers Overall transfer level: Needs assistance Equipment used: Rolling walker (2 wheels) Transfers: Sit to/from Stand Sit to Stand: Supervision           General transfer comment: safe technique with 1 cue for pushing from seated surface    Ambulation/Gait Ambulation/Gait assistance: Min guard Gait  Distance (Feet): 150 Feet Assistive device: Rolling walker (2 wheels) Gait Pattern/deviations: Step-through pattern       General Gait Details: ambulated in hallway with step to progressing to reciprocal gait. Safe technique with cues for upright posture. RW used   Stairs Stairs: Yes Stairs assistance: Min guard Stair Management: Two rails, Step to pattern Number of Stairs: 4 General stair comments: up/down with safe technique, no buckling noted   Wheelchair Mobility    Modified Rankin (Stroke Patients Only)       Balance Overall balance assessment: Needs assistance Sitting-balance support: Feet supported Sitting balance-Leahy Scale: Good     Standing balance support: Bilateral upper extremity supported, During functional activity Standing balance-Leahy Scale: Fair                              Cognition Arousal/Alertness: Awake/alert Behavior During Therapy: WFL for tasks assessed/performed Overall Cognitive Status: Within Functional Limits for tasks assessed                                          Exercises Total Joint Exercises Goniometric ROM: L knee AAROM: 90 degrees Other Exercises Other Exercises: ambulation to bathroom with cga. Able to sit on [facility] with safe technique. Cues for sequencing during transfers Other Exercises: performed HEP, reviewed expectations for HHPT, and reviewed frequency and duration for mobility in home    General Comments        Pertinent Vitals/Pain Pain Assessment Pain Assessment: Faces Pain Score: 4  Faces Pain Scale: Hurts even  more Pain Location: L knee Pain Descriptors / Indicators: Operative site guarding, Grimacing, Sharp, Shooting Pain Intervention(s): Limited activity within patient's tolerance, Repositioned, Ice applied    Home Living Family/patient expects to be discharged to:: Private residence Living Arrangements: Spouse/significant other Available Help at Discharge:  Family;Available 24 hours/day Type of Home: House Home Access: Stairs to enter Entrance Stairs-Rails: Can reach both Entrance Stairs-Number of Steps: 3   Home Layout: One level Home Equipment: Grab bars - tub/shower;Hand held shower head;Shower seat      Prior Function            PT Goals (current goals can now be found in the care plan section) Acute Rehab PT Goals Patient Stated Goal: to go home PT Goal Formulation: With patient Time For Goal Achievement: 12/06/22 Potential to Achieve Goals: Good Progress towards PT goals: Progressing toward goals    Frequency    BID      PT Plan Current plan remains appropriate    Co-evaluation              AM-PAC PT "6 Clicks" Mobility   Outcome Measure  Help needed turning from your back to your side while in a flat bed without using bedrails?: A Little Help needed moving from lying on your back to sitting on the side of a flat bed without using bedrails?: A Little Help needed moving to and from a bed to a chair (including a wheelchair)?: A Little Help needed standing up from a chair using your arms (e.g., wheelchair or bedside chair)?: A Little Help needed to walk in hospital room?: A Little Help needed climbing 3-5 steps with a railing? : A Little 6 Click Score: 18    End of Session Equipment Utilized During Treatment: Gait belt Activity Tolerance: Patient tolerated treatment well Patient left:  (left in bathroom with wife present) Nurse Communication: Mobility status PT Visit Diagnosis: Unsteadiness on feet (R26.81);Muscle weakness (generalized) (M62.81);Difficulty in walking, not elsewhere classified (R26.2);Pain Pain - Right/Left: Left Pain - part of body: Knee     Time: 1315-1341 PT Time Calculation (min) (ACUTE ONLY): 26 min  Charges:  $Gait Training: 8-22 mins $Therapeutic Exercise: 8-22 mins                     Greggory Stallion, PT, DPT, GCS [phone]    [name] 11/22/2022, 2:42 PM

## 2022-11-22 NOTE — Anesthesia Postprocedure Evaluation (Signed)
Anesthesia Post Note  Patient: [name]  Procedure(s) Performed: COMPUTER ASSISTED TOTAL KNEE ARTHROPLASTY (Left: Knee)  Patient location during evaluation: Nursing Unit Anesthesia Type: Spinal Level of consciousness: oriented and awake and alert Pain management: pain level controlled Vital Signs Assessment: post-procedure vital signs reviewed and stable Respiratory status: spontaneous breathing and respiratory function stable Cardiovascular status: blood pressure returned to baseline and stable Postop Assessment: no headache, no backache, no apparent nausea or vomiting, patient able to bend at knees and able to ambulate Anesthetic complications: no  No notable events documented.   Last Vitals:  Vitals:   11/22/22 0416 11/22/22 0816  BP: (!) 107/53 125/68  Pulse: (!) 56 (!) 59  Resp: 20 17  Temp: 36.6 C (!) 36.4 C  SpO2: 96% 96%    Last Pain:  Vitals:   11/22/22 1057  TempSrc:   PainSc: 10-Worst pain ever                 Martha Clan

## 2022-11-22 NOTE — Evaluation (Signed)
Physical Therapy Evaluation Patient Details Name: [name] MRN: [id number] DOB: [birth year] Today's Date: 11/22/2022  History of Present Illness  Pt admitted for L TKR. HIstory includes GERD, HTN, and DM. Pt is POD 1 at time of evaluation  Clinical Impression  Pt is a pleasant 67 year old male who was admitted for L TKR. Pt performs bed mobility, transfers, and ambulation with cga and RW. Pt demonstrates ability to perform 10 SLRs with independence, therefore does not require KI for mobility. Pt demonstrates deficits with strength/mobility/pain. Would benefit from skilled PT to address above deficits and promote optimal return to PLOF. Recommend transition to [facility] upon discharge from acute hospitalization.    Recommendations for follow up therapy are one component of a multi-disciplinary discharge planning process, led by the attending physician.  Recommendations may be updated based on patient status, additional functional criteria and insurance authorization.  Follow Up Recommendations Home health PT      Assistance Recommended at Discharge Intermittent Supervision/Assistance  Patient can return home with the following  A little help with walking and/or transfers;A little help with bathing/dressing/bathroom;Help with stairs or ramp for entrance    Equipment Recommendations Rolling walker (2 wheels);BSC/3in1  Recommendations for Other Services       Functional Status Assessment Patient has had a recent decline in their functional status and demonstrates the ability to make significant improvements in function in a reasonable and predictable amount of time.     Precautions / Restrictions Precautions Precautions: Fall;Knee Precaution Booklet Issued: Yes (comment) Restrictions Weight Bearing Restrictions: Yes LLE Weight Bearing: Weight bearing as tolerated      Mobility  Bed Mobility Overal bed mobility: Needs Assistance Bed Mobility: Supine to Sit     Supine to sit: Min  guard     General bed mobility comments: safe technique with upright posture. Once seated at EOB, no dizziness noted    Transfers Overall transfer level: Needs assistance Equipment used: Rolling walker (2 wheels) Transfers: Sit to/from Stand Sit to Stand: Min guard           General transfer comment: cues for pushing from seated surface. Once standing, upright posture    Ambulation/Gait Ambulation/Gait assistance: Min guard Gait Distance (Feet): 80 Feet Assistive device: Rolling walker (2 wheels) Gait Pattern/deviations: Step-through pattern       General Gait Details: ambulated in hallway with step to gait pattern. Unable to progress to reciprocal gait at this time. Cues for sequencing with RW.  Stairs            Wheelchair Mobility    Modified Rankin (Stroke Patients Only)       Balance Overall balance assessment: Needs assistance Sitting-balance support: Feet supported Sitting balance-Leahy Scale: Good     Standing balance support: Bilateral upper extremity supported                                 Pertinent Vitals/Pain Pain Assessment Pain Assessment: 0-10 Pain Score: 4  Pain Location: L knee Pain Descriptors / Indicators: Operative site guarding Pain Intervention(s): Limited activity within patient's tolerance, Ice applied, Premedicated before session    Home Living Family/patient expects to be discharged to:: Private residence Living Arrangements: Spouse/significant other Available Help at Discharge: Family;Available 24 hours/day Type of Home: House Home Access: Stairs to enter Entrance Stairs-Rails: Can reach both Entrance Stairs-Number of Steps: 3   Home Layout: One level Home Equipment: None  Prior Function Prior Level of Function : Independent/Modified Independent;Driving             Mobility Comments: indep with all mobility. Reports no recent falls ADLs Comments: indep     Hand Dominance         Extremity/Trunk Assessment   Upper Extremity Assessment Upper Extremity Assessment: Overall WFL for tasks assessed    Lower Extremity Assessment Lower Extremity Assessment: Generalized weakness (L LE grossly 3/5)       Communication   Communication: No difficulties  Cognition Arousal/Alertness: Awake/alert Behavior During Therapy: WFL for tasks assessed/performed Overall Cognitive Status: Within Functional Limits for tasks assessed                                          General Comments      Exercises Total Joint Exercises Goniometric ROM: L knee AAROM: 0 degrees extension, will measure flexion in PM session Other Exercises Other Exercises: ambulation to bathroom with cga. Able to sit on [facility] with safe technique. Cues for sequencing during transfers   Assessment/Plan    PT Assessment Patient needs continued PT services  PT Problem List Decreased strength;Decreased range of motion;Decreased balance;Decreased mobility;Pain       PT Treatment Interventions DME instruction;Gait training;Stair training;Therapeutic exercise    PT Goals (Current goals can be found in the Care Plan section)  Acute Rehab PT Goals Patient Stated Goal: to go home PT Goal Formulation: With patient Time For Goal Achievement: 12/06/22 Potential to Achieve Goals: Good    Frequency BID     Co-evaluation               AM-PAC PT "6 Clicks" Mobility  Outcome Measure Help needed turning from your back to your side while in a flat bed without using bedrails?: A Little Help needed moving from lying on your back to sitting on the side of a flat bed without using bedrails?: A Little Help needed moving to and from a bed to a chair (including a wheelchair)?: A Little Help needed standing up from a chair using your arms (e.g., wheelchair or bedside chair)?: A Little Help needed to walk in hospital room?: A Little Help needed climbing 3-5 steps with a railing? : A Little 6 Click  Score: 18    End of Session Equipment Utilized During Treatment: Gait belt Activity Tolerance: Patient tolerated treatment well Patient left: in chair;with chair alarm set;with family/visitor present Nurse Communication: Mobility status PT Visit Diagnosis: Unsteadiness on feet (R26.81);Muscle weakness (generalized) (M62.81);Difficulty in walking, not elsewhere classified (R26.2);Pain Pain - Right/Left: Left Pain - part of body: Knee    Time: 0802-0838 PT Time Calculation (min) (ACUTE ONLY): 36 min   Charges:   PT Evaluation $PT Eval Low Complexity: 1 Low PT Treatments $Gait Training: 8-22 mins $Therapeutic Activity: 8-22 mins        Greggory Stallion, PT, DPT, GCS [phone]   Tierria Watson 11/22/2022, 9:35 AM

## 2022-11-22 NOTE — Progress Notes (Signed)
  Subjective: 1 Day Post-Op Procedure(s) (LRB): COMPUTER ASSISTED TOTAL KNEE ARTHROPLASTY (Left) Patient reports pain as moderate.   Patient is well, and has had no acute complaints or problems Plan is to go Home after hospital stay. Negative for chest pain and shortness of breath Fever: no Gastrointestinal:Negative for nausea and vomiting Patient required a in and out cath last night, has urinated on his own since then.  Objective: Vital signs in last 24 hours: Temp:  [97 F (36.1 C)-98.3 F (36.8 C)] 97.5 F (36.4 C) (02/03 0816) Pulse Rate:  [46-68] 59 (02/03 0816) Resp:  [9-20] 17 (02/03 0816) BP: (97-134)/(50-76) 125/68 (02/03 0816) SpO2:  [92 %-98 %] 96 % (02/03 0816) Weight:  [75.8 kg] 75.8 kg (02/02 1038)  Intake/Output from previous day:  Intake/Output Summary (Last 24 hours) at 11/22/2022 0931 Last data filed at 11/22/2022 0400 Gross per 24 hour  Intake 1200 ml  Output 1850 ml  Net -650 ml    Intake/Output this shift: No intake/output data recorded.  Labs: No results for input(s): "HGB" in the last 72 hours. No results for input(s): "WBC", "RBC", "HCT", "PLT" in the last 72 hours. No results for input(s): "NA", "K", "CL", "CO2", "BUN", "CREATININE", "GLUCOSE", "CALCIUM" in the last 72 hours. No results for input(s): "LABPT", "INR" in the last 72 hours.   EXAM General - Patient is Alert, Appropriate, and Oriented Extremity - ABD soft Neurovascular intact Dorsiflexion/Plantar flexion intact Incision: Bulky dressing intact to the left leg. No cellulitis present Compartment soft Dressing/Incision - Bulky dressing intact to the left leg.  Bulky dressing was removed this AM.   Hemovac removed without complications.  Fresh honeycomb applied, 4x4 with tegaderm applied over the drain site. Motor Function - intact, moving foot and toes well on exam.  Abdomen soft with palpation and intact bowel sounds this morning.  Past Medical History:  Diagnosis Date   Arthritis     Colon polyps    Diabetes mellitus without complication (HCC)    Dysrhythmia    GERD (gastroesophageal reflux disease)    Heart murmur    with rheumatic fever as a child   History of kidney stones    Irregular heart rhythm    Kidney stone    Meniscus tear    left knee   Pneumonia    Pneumothorax    Pneumothorax    PONV (postoperative nausea and vomiting)    Rheumatic fever     Assessment/Plan: 1 Day Post-Op Procedure(s) (LRB): COMPUTER ASSISTED TOTAL KNEE ARTHROPLASTY (Left) Principal Problem:   Total knee replacement status  Estimated body mass index is 23.29 kg/m as calculated from the following:   Height as of this encounter: 5\' 11"  (1.803 m).   Weight as of this encounter: 75.8 kg. Advance diet Up with therapy D/C IV fluids when tolerating po intake.  Labs and vitals reviewed this AM. Bulky dressing removed, hemovac removed without complication. Up with therapy today, will need to clear stairs this evening. Plan for possible d/c home this afternoon pending progress with PT. Continue oxycodone as needed for pain.  Discontinued tramadol this morning.  DVT Prophylaxis - Lovenox, TED hose, and SCDs Weight-Bearing as tolerated to left leg  J. Cameron Proud, PA-C [facility] Orthopaedic Surgery 11/22/2022, 9:31 AM

## 2022-11-22 NOTE — Progress Notes (Signed)
11/22/22: Patient is not able to walk the distance required to go the bathroom, or he/she is unable to safely negotiate stairs required to access the bathroom.  A BSC will alleviate this problem.

## 2022-11-22 NOTE — Progress Notes (Signed)
Chaplain responded to consult because patient requested prayer. [place] engaged in uplifting conversation with patient and his wife. PT shared his faith is important to him. [place] offered prayer to promote spiritual comfort and connection. PT was grateful for [facility] visit. Please contact [place] if requested.

## 2022-11-23 DIAGNOSIS — F0390 Unspecified dementia without behavioral disturbance: Secondary | ICD-10-CM | POA: Diagnosis not present

## 2022-11-23 DIAGNOSIS — E7800 Pure hypercholesterolemia, unspecified: Secondary | ICD-10-CM | POA: Diagnosis not present

## 2022-11-23 DIAGNOSIS — M1990 Unspecified osteoarthritis, unspecified site: Secondary | ICD-10-CM | POA: Diagnosis not present

## 2022-11-23 DIAGNOSIS — Z96652 Presence of left artificial knee joint: Secondary | ICD-10-CM | POA: Diagnosis not present

## 2022-11-23 DIAGNOSIS — I700 Atherosclerosis of aorta: Secondary | ICD-10-CM | POA: Diagnosis not present

## 2022-11-23 DIAGNOSIS — H353131 Nonexudative age-related macular degeneration, bilateral, early dry stage: Secondary | ICD-10-CM | POA: Diagnosis not present

## 2022-11-23 DIAGNOSIS — F5105 Insomnia due to other mental disorder: Secondary | ICD-10-CM | POA: Diagnosis not present

## 2022-11-23 DIAGNOSIS — I10 Essential (primary) hypertension: Secondary | ICD-10-CM | POA: Diagnosis not present

## 2022-11-23 DIAGNOSIS — Z8616 Personal history of COVID-19: Secondary | ICD-10-CM | POA: Diagnosis not present

## 2022-11-23 DIAGNOSIS — J4520 Mild intermittent asthma, uncomplicated: Secondary | ICD-10-CM | POA: Diagnosis not present

## 2022-11-23 DIAGNOSIS — Z8701 Personal history of pneumonia (recurrent): Secondary | ICD-10-CM | POA: Diagnosis not present

## 2022-11-23 DIAGNOSIS — H52223 Regular astigmatism, bilateral: Secondary | ICD-10-CM | POA: Diagnosis not present

## 2022-11-23 DIAGNOSIS — H524 Presbyopia: Secondary | ICD-10-CM | POA: Diagnosis not present

## 2022-11-23 DIAGNOSIS — Z7984 Long term (current) use of oral hypoglycemic drugs: Secondary | ICD-10-CM | POA: Diagnosis not present

## 2022-11-23 DIAGNOSIS — Z794 Long term (current) use of insulin: Secondary | ICD-10-CM | POA: Diagnosis not present

## 2022-11-23 DIAGNOSIS — K219 Gastro-esophageal reflux disease without esophagitis: Secondary | ICD-10-CM | POA: Diagnosis not present

## 2022-11-23 DIAGNOSIS — Z471 Aftercare following joint replacement surgery: Secondary | ICD-10-CM | POA: Diagnosis not present

## 2022-11-23 DIAGNOSIS — E1169 Type 2 diabetes mellitus with other specified complication: Secondary | ICD-10-CM | POA: Diagnosis not present

## 2022-11-24 ENCOUNTER — Encounter: Payer: Self-pay | Admitting: Orthopedic Surgery

## 2022-12-04 DIAGNOSIS — E785 Hyperlipidemia, unspecified: Secondary | ICD-10-CM | POA: Diagnosis not present

## 2022-12-04 DIAGNOSIS — E119 Type 2 diabetes mellitus without complications: Secondary | ICD-10-CM | POA: Diagnosis not present

## 2022-12-04 DIAGNOSIS — E1169 Type 2 diabetes mellitus with other specified complication: Secondary | ICD-10-CM | POA: Diagnosis not present

## 2022-12-05 DIAGNOSIS — M25562 Pain in left knee: Secondary | ICD-10-CM | POA: Diagnosis not present

## 2022-12-05 DIAGNOSIS — Z96652 Presence of left artificial knee joint: Secondary | ICD-10-CM | POA: Diagnosis not present

## 2022-12-09 DIAGNOSIS — M25562 Pain in left knee: Secondary | ICD-10-CM | POA: Diagnosis not present

## 2022-12-09 DIAGNOSIS — Z96652 Presence of left artificial knee joint: Secondary | ICD-10-CM | POA: Diagnosis not present

## 2022-12-11 DIAGNOSIS — Z96652 Presence of left artificial knee joint: Secondary | ICD-10-CM | POA: Diagnosis not present

## 2022-12-11 DIAGNOSIS — M25562 Pain in left knee: Secondary | ICD-10-CM | POA: Diagnosis not present

## 2022-12-15 DIAGNOSIS — Z96652 Presence of left artificial knee joint: Secondary | ICD-10-CM | POA: Diagnosis not present

## 2022-12-15 DIAGNOSIS — M25562 Pain in left knee: Secondary | ICD-10-CM | POA: Diagnosis not present

## 2022-12-17 DIAGNOSIS — Z96652 Presence of left artificial knee joint: Secondary | ICD-10-CM | POA: Diagnosis not present

## 2022-12-17 DIAGNOSIS — M25562 Pain in left knee: Secondary | ICD-10-CM | POA: Diagnosis not present

## 2022-12-19 DIAGNOSIS — M25562 Pain in left knee: Secondary | ICD-10-CM | POA: Diagnosis not present

## 2022-12-19 DIAGNOSIS — Z96652 Presence of left artificial knee joint: Secondary | ICD-10-CM | POA: Diagnosis not present

## 2022-12-22 DIAGNOSIS — M25562 Pain in left knee: Secondary | ICD-10-CM | POA: Diagnosis not present

## 2022-12-22 DIAGNOSIS — Z96652 Presence of left artificial knee joint: Secondary | ICD-10-CM | POA: Diagnosis not present

## 2022-12-22 DIAGNOSIS — Z471 Aftercare following joint replacement surgery: Secondary | ICD-10-CM | POA: Diagnosis not present

## 2022-12-24 DIAGNOSIS — Z96652 Presence of left artificial knee joint: Secondary | ICD-10-CM | POA: Diagnosis not present

## 2022-12-26 DIAGNOSIS — Z96652 Presence of left artificial knee joint: Secondary | ICD-10-CM | POA: Diagnosis not present

## 2022-12-26 DIAGNOSIS — M25562 Pain in left knee: Secondary | ICD-10-CM | POA: Diagnosis not present

## 2022-12-29 DIAGNOSIS — Z96652 Presence of left artificial knee joint: Secondary | ICD-10-CM | POA: Diagnosis not present

## 2022-12-29 DIAGNOSIS — M25562 Pain in left knee: Secondary | ICD-10-CM | POA: Diagnosis not present

## 2022-12-31 DIAGNOSIS — Z96652 Presence of left artificial knee joint: Secondary | ICD-10-CM | POA: Diagnosis not present

## 2022-12-31 DIAGNOSIS — M25562 Pain in left knee: Secondary | ICD-10-CM | POA: Diagnosis not present

## 2023-01-01 DIAGNOSIS — Z96652 Presence of left artificial knee joint: Secondary | ICD-10-CM | POA: Diagnosis not present

## 2023-01-02 DIAGNOSIS — Z96652 Presence of left artificial knee joint: Secondary | ICD-10-CM | POA: Diagnosis not present

## 2023-01-02 DIAGNOSIS — M25562 Pain in left knee: Secondary | ICD-10-CM | POA: Diagnosis not present

## 2023-01-05 DIAGNOSIS — Z125 Encounter for screening for malignant neoplasm of prostate: Secondary | ICD-10-CM | POA: Diagnosis not present

## 2023-01-05 DIAGNOSIS — Z96652 Presence of left artificial knee joint: Secondary | ICD-10-CM | POA: Diagnosis not present

## 2023-01-05 DIAGNOSIS — E118 Type 2 diabetes mellitus with unspecified complications: Secondary | ICD-10-CM | POA: Diagnosis not present

## 2023-01-05 DIAGNOSIS — M25562 Pain in left knee: Secondary | ICD-10-CM | POA: Diagnosis not present

## 2023-01-07 DIAGNOSIS — Z96652 Presence of left artificial knee joint: Secondary | ICD-10-CM | POA: Diagnosis not present

## 2023-01-07 DIAGNOSIS — M25562 Pain in left knee: Secondary | ICD-10-CM | POA: Diagnosis not present

## 2023-01-09 DIAGNOSIS — Z96652 Presence of left artificial knee joint: Secondary | ICD-10-CM | POA: Diagnosis not present

## 2023-01-12 DIAGNOSIS — M25562 Pain in left knee: Secondary | ICD-10-CM | POA: Diagnosis not present

## 2023-01-12 DIAGNOSIS — Z96652 Presence of left artificial knee joint: Secondary | ICD-10-CM | POA: Diagnosis not present

## 2023-01-13 DIAGNOSIS — R972 Elevated prostate specific antigen [PSA]: Secondary | ICD-10-CM | POA: Diagnosis not present

## 2023-01-13 DIAGNOSIS — E118 Type 2 diabetes mellitus with unspecified complications: Secondary | ICD-10-CM | POA: Diagnosis not present

## 2023-01-13 DIAGNOSIS — Z0000 Encounter for general adult medical examination without abnormal findings: Secondary | ICD-10-CM | POA: Diagnosis not present

## 2023-01-13 DIAGNOSIS — I700 Atherosclerosis of aorta: Secondary | ICD-10-CM | POA: Diagnosis not present

## 2023-01-13 DIAGNOSIS — E7800 Pure hypercholesterolemia, unspecified: Secondary | ICD-10-CM | POA: Diagnosis not present

## 2023-01-13 DIAGNOSIS — Z125 Encounter for screening for malignant neoplasm of prostate: Secondary | ICD-10-CM | POA: Diagnosis not present

## 2023-01-13 DIAGNOSIS — J432 Centrilobular emphysema: Secondary | ICD-10-CM | POA: Diagnosis not present

## 2023-01-14 DIAGNOSIS — M25562 Pain in left knee: Secondary | ICD-10-CM | POA: Diagnosis not present

## 2023-01-14 DIAGNOSIS — Z96652 Presence of left artificial knee joint: Secondary | ICD-10-CM | POA: Diagnosis not present

## 2023-01-20 DIAGNOSIS — Z96652 Presence of left artificial knee joint: Secondary | ICD-10-CM | POA: Diagnosis not present

## 2023-01-20 DIAGNOSIS — M25562 Pain in left knee: Secondary | ICD-10-CM | POA: Diagnosis not present

## 2023-01-23 DIAGNOSIS — M25562 Pain in left knee: Secondary | ICD-10-CM | POA: Diagnosis not present

## 2023-01-23 DIAGNOSIS — Z96652 Presence of left artificial knee joint: Secondary | ICD-10-CM | POA: Diagnosis not present

## 2023-01-27 DIAGNOSIS — Z96652 Presence of left artificial knee joint: Secondary | ICD-10-CM | POA: Diagnosis not present

## 2023-01-27 DIAGNOSIS — M25562 Pain in left knee: Secondary | ICD-10-CM | POA: Diagnosis not present

## 2023-01-30 DIAGNOSIS — Z96652 Presence of left artificial knee joint: Secondary | ICD-10-CM | POA: Diagnosis not present

## 2023-01-30 DIAGNOSIS — M25562 Pain in left knee: Secondary | ICD-10-CM | POA: Diagnosis not present

## 2023-02-03 DIAGNOSIS — M25562 Pain in left knee: Secondary | ICD-10-CM | POA: Diagnosis not present

## 2023-02-03 DIAGNOSIS — Z96652 Presence of left artificial knee joint: Secondary | ICD-10-CM | POA: Diagnosis not present

## 2023-02-06 DIAGNOSIS — Z96652 Presence of left artificial knee joint: Secondary | ICD-10-CM | POA: Diagnosis not present

## 2023-02-06 DIAGNOSIS — M25562 Pain in left knee: Secondary | ICD-10-CM | POA: Diagnosis not present

## 2023-02-10 ENCOUNTER — Other Ambulatory Visit: Payer: Self-pay | Admitting: Physician Assistant

## 2023-02-10 ENCOUNTER — Ambulatory Visit
Admission: RE | Admit: 2023-02-10 | Discharge: 2023-02-10 | Disposition: A | Discharge status: Home / Self Care | Payer: PPO | Source: Ambulatory Visit | Attending: Physician Assistant | Admitting: Physician Assistant

## 2023-02-10 DIAGNOSIS — M7989 Other specified soft tissue disorders: Secondary | ICD-10-CM | POA: Insufficient documentation

## 2023-02-10 DIAGNOSIS — R600 Localized edema: Secondary | ICD-10-CM | POA: Diagnosis not present

## 2023-02-12 DIAGNOSIS — M25562 Pain in left knee: Secondary | ICD-10-CM | POA: Diagnosis not present

## 2023-02-12 DIAGNOSIS — Z96652 Presence of left artificial knee joint: Secondary | ICD-10-CM | POA: Diagnosis not present

## 2023-03-19 DIAGNOSIS — H2513 Age-related nuclear cataract, bilateral: Secondary | ICD-10-CM | POA: Diagnosis not present

## 2023-03-19 DIAGNOSIS — E119 Type 2 diabetes mellitus without complications: Secondary | ICD-10-CM | POA: Diagnosis not present

## 2023-05-21 DIAGNOSIS — Z96652 Presence of left artificial knee joint: Secondary | ICD-10-CM | POA: Diagnosis not present

## 2023-06-09 DIAGNOSIS — E1169 Type 2 diabetes mellitus with other specified complication: Secondary | ICD-10-CM | POA: Diagnosis not present

## 2023-06-09 DIAGNOSIS — E785 Hyperlipidemia, unspecified: Secondary | ICD-10-CM | POA: Diagnosis not present

## 2023-06-09 DIAGNOSIS — E119 Type 2 diabetes mellitus without complications: Secondary | ICD-10-CM | POA: Diagnosis not present

## 2023-11-24 DIAGNOSIS — E1169 Type 2 diabetes mellitus with other specified complication: Secondary | ICD-10-CM | POA: Diagnosis not present

## 2023-11-24 DIAGNOSIS — E785 Hyperlipidemia, unspecified: Secondary | ICD-10-CM | POA: Diagnosis not present

## 2023-11-24 DIAGNOSIS — Z96652 Presence of left artificial knee joint: Secondary | ICD-10-CM | POA: Diagnosis not present

## 2023-12-17 DIAGNOSIS — E785 Hyperlipidemia, unspecified: Secondary | ICD-10-CM | POA: Diagnosis not present

## 2023-12-17 DIAGNOSIS — E1169 Type 2 diabetes mellitus with other specified complication: Secondary | ICD-10-CM | POA: Diagnosis not present

## 2023-12-17 DIAGNOSIS — E119 Type 2 diabetes mellitus without complications: Secondary | ICD-10-CM | POA: Diagnosis not present

## 2024-01-08 DIAGNOSIS — Z125 Encounter for screening for malignant neoplasm of prostate: Secondary | ICD-10-CM | POA: Diagnosis not present

## 2024-01-08 DIAGNOSIS — E118 Type 2 diabetes mellitus with unspecified complications: Secondary | ICD-10-CM | POA: Diagnosis not present

## 2024-01-18 DIAGNOSIS — E7800 Pure hypercholesterolemia, unspecified: Secondary | ICD-10-CM | POA: Diagnosis not present

## 2024-01-18 DIAGNOSIS — I700 Atherosclerosis of aorta: Secondary | ICD-10-CM | POA: Diagnosis not present

## 2024-01-18 DIAGNOSIS — Z125 Encounter for screening for malignant neoplasm of prostate: Secondary | ICD-10-CM | POA: Diagnosis not present

## 2024-01-18 DIAGNOSIS — R972 Elevated prostate specific antigen [PSA]: Secondary | ICD-10-CM | POA: Diagnosis not present

## 2024-01-18 DIAGNOSIS — E118 Type 2 diabetes mellitus with unspecified complications: Secondary | ICD-10-CM | POA: Diagnosis not present

## 2024-01-18 DIAGNOSIS — Z0000 Encounter for general adult medical examination without abnormal findings: Secondary | ICD-10-CM | POA: Diagnosis not present

## 2024-01-20 DIAGNOSIS — H2513 Age-related nuclear cataract, bilateral: Secondary | ICD-10-CM | POA: Diagnosis not present

## 2024-01-20 DIAGNOSIS — H524 Presbyopia: Secondary | ICD-10-CM | POA: Diagnosis not present

## 2024-01-20 DIAGNOSIS — H02834 Dermatochalasis of left upper eyelid: Secondary | ICD-10-CM | POA: Diagnosis not present

## 2024-01-20 DIAGNOSIS — B880 Other acariasis: Secondary | ICD-10-CM | POA: Diagnosis not present

## 2024-01-20 DIAGNOSIS — H01009 Unspecified blepharitis unspecified eye, unspecified eyelid: Secondary | ICD-10-CM | POA: Diagnosis not present

## 2024-01-20 DIAGNOSIS — H02831 Dermatochalasis of right upper eyelid: Secondary | ICD-10-CM | POA: Diagnosis not present

## 2024-01-20 DIAGNOSIS — E119 Type 2 diabetes mellitus without complications: Secondary | ICD-10-CM | POA: Diagnosis not present

## 2024-01-20 DIAGNOSIS — H02402 Unspecified ptosis of left eyelid: Secondary | ICD-10-CM | POA: Diagnosis not present

## 2024-01-20 DIAGNOSIS — H43813 Vitreous degeneration, bilateral: Secondary | ICD-10-CM | POA: Diagnosis not present

## 2024-04-20 DIAGNOSIS — H16223 Keratoconjunctivitis sicca, not specified as Sjogren's, bilateral: Secondary | ICD-10-CM | POA: Diagnosis not present

## 2024-04-20 DIAGNOSIS — B880 Other acariasis: Secondary | ICD-10-CM | POA: Diagnosis not present

## 2024-04-20 DIAGNOSIS — H01009 Unspecified blepharitis unspecified eye, unspecified eyelid: Secondary | ICD-10-CM | POA: Diagnosis not present

## 2024-04-21 ENCOUNTER — Other Ambulatory Visit: Payer: Self-pay | Admitting: Physician Assistant

## 2024-04-21 ENCOUNTER — Ambulatory Visit
Admission: RE | Admit: 2024-04-21 | Discharge: 2024-04-21 | Disposition: A | Discharge status: Home / Self Care | Source: Ambulatory Visit | Attending: Physician Assistant | Admitting: Physician Assistant

## 2024-04-21 DIAGNOSIS — R300 Dysuria: Secondary | ICD-10-CM | POA: Diagnosis not present

## 2024-04-21 DIAGNOSIS — R1031 Right lower quadrant pain: Secondary | ICD-10-CM | POA: Insufficient documentation

## 2024-04-21 DIAGNOSIS — R109 Unspecified abdominal pain: Secondary | ICD-10-CM | POA: Insufficient documentation

## 2024-04-21 DIAGNOSIS — R310 Gross hematuria: Secondary | ICD-10-CM | POA: Insufficient documentation

## 2024-04-21 DIAGNOSIS — N200 Calculus of kidney: Secondary | ICD-10-CM | POA: Diagnosis not present

## 2024-04-21 DIAGNOSIS — N400 Enlarged prostate without lower urinary tract symptoms: Secondary | ICD-10-CM | POA: Diagnosis not present

## 2024-06-02 DIAGNOSIS — E785 Hyperlipidemia, unspecified: Secondary | ICD-10-CM | POA: Diagnosis not present

## 2024-06-02 DIAGNOSIS — E1169 Type 2 diabetes mellitus with other specified complication: Secondary | ICD-10-CM | POA: Diagnosis not present

## 2024-06-02 DIAGNOSIS — E119 Type 2 diabetes mellitus without complications: Secondary | ICD-10-CM | POA: Diagnosis not present

## 2024-06-08 ENCOUNTER — Ambulatory Visit
Admission: RE | Admit: 2024-06-08 | Discharge: 2024-06-08 | Disposition: A | Discharge status: Home / Self Care | Source: Ambulatory Visit | Attending: Internal Medicine | Admitting: Internal Medicine

## 2024-06-08 ENCOUNTER — Other Ambulatory Visit: Payer: Self-pay | Admitting: Internal Medicine

## 2024-06-08 DIAGNOSIS — N202 Calculus of kidney with calculus of ureter: Secondary | ICD-10-CM | POA: Diagnosis not present

## 2024-06-08 DIAGNOSIS — R319 Hematuria, unspecified: Secondary | ICD-10-CM | POA: Diagnosis not present

## 2024-06-08 DIAGNOSIS — R1031 Right lower quadrant pain: Secondary | ICD-10-CM | POA: Diagnosis not present

## 2024-06-08 DIAGNOSIS — N200 Calculus of kidney: Secondary | ICD-10-CM | POA: Diagnosis not present

## 2024-06-08 DIAGNOSIS — R109 Unspecified abdominal pain: Secondary | ICD-10-CM | POA: Diagnosis not present

## 2024-06-08 DIAGNOSIS — E118 Type 2 diabetes mellitus with unspecified complications: Secondary | ICD-10-CM | POA: Diagnosis not present

## 2024-06-08 DIAGNOSIS — N201 Calculus of ureter: Secondary | ICD-10-CM | POA: Diagnosis not present

## 2024-06-08 DIAGNOSIS — N289 Disorder of kidney and ureter, unspecified: Secondary | ICD-10-CM | POA: Diagnosis not present

## 2024-06-08 MED ORDER — IOHEXOL 300 MG/ML  SOLN
100.0000 mL | Freq: Once | INTRAMUSCULAR | Status: AC | PRN
  Administered 2024-06-08: 100 mL via INTRAVENOUS

## 2024-06-08 NOTE — Progress Notes (Addendum)
 Patient Profile:   [name]  is a 68 y.o.  male Chief Complaint  Patient presents with  . Flank Pain    Right sided 10/10  . Hematuria    X 2 months - intermittent  . burning with urination  . Nausea      PROBLEM LIST: Past Medical History:  Diagnosis Date  . Atypical chest pain   . Diabetes (CMS/HHS-HCC)   . GERD (gastroesophageal reflux disease)   . Heart murmur   . History of pneumothorax 10/20/1974  . Hypercholesterolemia   . Hypertension   . Intermittent asthma (HHS-HCC)   . Kidney stone remote  . Varicella     Past Surgical History:  Procedure Laterality Date  . COLONOSCOPY  02/06/2006   Adenomatous Polyp  . COLONOSCOPY  12/19/2011   PH Adenomatous Polyp: CBF 12/2016; Recall Ltr mailed 11/13/2016 (dw)  . Left knee arthroscopy, partial medial meniscectomy, and chondroplasty of the medial and patellofemoral compartments  09/24/2015   Dr Mardee  . COLONOSCOPY  03/15/2019   PH Adenomatous Polyps: [facility]BF 02/2024  . EGD  03/15/2019   Gastritis: CBF 05/2019 Recall ltr mailed   . Left total knee arthroplasty using computer-assisted navigation  11/21/2022   Dr Mardee  . APPENDECTOMY    . LITHOTRIPSY    . Pneumothorax      ALLERGIES: No Known Allergies  CURRENT MEDICATIONS: Current Outpatient Medications  Medication Sig Dispense Refill  . acetaminophen  (TYLENOL ) 500 MG tablet Take 1,000-1,500 mg by mouth every 8 (eight) hours as needed for Pain    . albuterol  90 mcg/actuation inhaler Inhale 2 inhalations into the lungs every 4 (four) hours as needed    . atorvastatin  (LIPITOR) 40 MG tablet Take 1 tablet (40 mg total) by mouth once daily 90 tablet 3  . blood-glucose meter,continuous (FREESTYLE LIBRE 3 READER) Misc Use 1 each as directed to monitor blood sugar 1 each 0  . blood-glucose sensor (FREESTYLE LIBRE 3 PLUS SENSOR) Devi Use 1 each every 15 (fifteen) days 2 each 12  . celecoxib  (CELEBREX ) 200 MG capsule Take 1 capsule  (200 mg total) by mouth 2 (two) times daily 180 capsule 3  . cholecalciferol (VITAMIN D3) 1,000 unit capsule Take 5,000 Units by mouth once daily    . clonazePAM  (KLONOPIN ) 1 MG tablet Take 1 tablet (1 mg total) by mouth at bedtime as needed for Anxiety 90 tablet 1  . diclofenac (VOLTAREN) 1 % topical gel Apply 2 g topically 3 (three) times daily 100 g 3  . dulaglutide  (TRULICITY ) 3 mg/0.5 mL subcutaneous pen injector Inject 0.5 mLs (3 mg total) subcutaneously once a week 6 mL 3  . empagliflozin  (JARDIANCE ) 25 mg tablet Take 1 tablet (25 mg total) by mouth every morning 30 tablet 6  . metFORMIN  (GLUCOPHAGE -XR) 500 MG XR tablet TAKE TWO (2) TABLETS BY MOUTH EVERY MORNING AND 1 TABLET BY MOUTH EVERY EVENING 270 tablet 3  . multivitamin capsule Take 1 capsule by mouth once daily.    . pantoprazole  (PROTONIX ) 40 MG DR tablet Take 1 tablet (40 mg total) by mouth once daily 90 tablet 3  . tamsulosin (FLOMAX) 0.4 mg capsule Take 1 capsule (0.4 mg total) by mouth once daily Take 30 minutes after same meal each day. 30 capsule 1  . vitamin E 400 UNIT capsule Take 400 Units by mouth  once daily       . zinc 50 mg Tab Take 1 tablet by mouth once daily       . acetaminophen -codeine (TYLENOL  #3) 300-30 mg tablet Take 1 tablet by mouth every 4 (four) hours as needed for Pain for up to 5 days 20 tablet 0  . ciprofloxacin HCl (CIPRO) 500 MG tablet Take 1 tablet (500 mg total) by mouth 2 (two) times daily for 7 days 14 tablet 0   No current facility-administered medications for this visit.      HPI   CLINICAL SUMMARY:  Patient presents acutely with persistent hematuria.  Saw Rob Tumey 7/3 CT did not show obstructive disease but did show bilateral renal calculi and constipation significant hematuria noted, urine culture negative, no anemia.  He has had off-and-on symptoms until 8 the last 3 to 4 days when he has had progressive urinary dysuria, hematuria, progressive right flank pain.  Blood sugars reasonably  controlled, A1c 7.4  ROS: Review of systems is unremarkable for any active cardiac, respiratory, GI, GU, hematologic, neurologic, dermatologic, HEENT, or psychiatric symptoms except as noted above, 10 systems reviewed.  No fevers, chills, or constitutional symptoms.   PHYSICAL EXAM  Vital signs:  BP 102/62 (BP Location: Left upper arm, Patient Position: Sitting, BP Cuff Size: Adult)   Pulse 64   Ht 179.1 cm (5' 10.5)   Wt 76 kg (167 lb 9.6 oz)   SpO2 94%   BMI 23.71 kg/m  Body mass index is 23.71 kg/m.   Wt Readings from Last 3 Encounters:  06/08/24 76 kg (167 lb 9.6 oz)  06/02/24 76.2 kg (168 lb)  04/21/24 74.8 kg (165 lb)     BP Readings from Last 3 Encounters:  06/08/24 102/62  06/02/24 130/68  04/21/24 120/70    Constitutional: Acute distress Neck: supple, no thyromegaly, good ROM Respiratory:clear to auscultation, no rales or wheezes Cardiovascular:RRR, no murmur or gallop Abdominal:soft, decreased bowel sounds, moderate right lower quadrant tenderness, questionable rebound Ext: no edema, good peripheral pulses Neuro: alert and oriented X 3, grossly nonfocal     ASSESSMENT/PLAN   Right lower quadrant abdominal pain/rebound, hematuria-concerning for kidney stone or other etiology, possible malignancy versus infection.  CT abdomen with and without contrast to evaluate fully with persistent symptoms.  Recheck labs, Tylenol  3 for pain, Flomax daily With dysuria, empiric Cipro 500 mg twice daily x 7 days  Type 2 diabetes-sugars reasonable control, A1c 7.4  CT shows a 9 mm left UPJ stone, very unlikely to pass, emergent/urgent appointment with urology for lithotripsy versus basket extraction.  Concern for creatinine up to 1.4.  When they are back from their vacation in [place] they will call about rechecking creatinine, push fluids, minimize anti-inflammatories  Dispo:   No follow-ups on file.

## 2024-06-09 ENCOUNTER — Other Ambulatory Visit: Payer: Self-pay

## 2024-06-09 ENCOUNTER — Ambulatory Visit (INDEPENDENT_AMBULATORY_CARE_PROVIDER_SITE_OTHER): Admitting: Urology

## 2024-06-09 VITALS — BP 129/84 | HR 60 | Ht 70.0 in | Wt 167.0 lb

## 2024-06-09 DIAGNOSIS — N201 Calculus of ureter: Secondary | ICD-10-CM

## 2024-06-09 DIAGNOSIS — R310 Gross hematuria: Secondary | ICD-10-CM

## 2024-06-09 DIAGNOSIS — N200 Calculus of kidney: Secondary | ICD-10-CM | POA: Diagnosis not present

## 2024-06-09 MED ORDER — ONDANSETRON HCL 4 MG PO TABS: 4.0000 mg | ORAL_TABLET | Freq: Three times a day (TID) | ORAL | 0 refills | Status: AC | PRN

## 2024-06-09 MED ORDER — HYDROCODONE-ACETAMINOPHEN 5-325 MG PO TABS: 1.0000 | ORAL_TABLET | Freq: Four times a day (QID) | ORAL | 0 refills | Status: DC | PRN

## 2024-06-09 NOTE — Progress Notes (Signed)
 06/09/24 1:15 PM   [name] 70-Feb-1957 [id number]  CC: Left ureteral, flank pain, gross hematuria  HPI: 68 year old male who is a prior patient of Dr. Gala who reports at least 6 weeks of gross hematuria and flank pain, his pain is primarily on the right side.  CT yesterday with PCP showed a 1 cm left UPJ stone with mild hydronephrosis, small nonobstructive right-sided stones, no right hydronephrosis.  He denies any fevers or chills.  Urinalysis when he started having pain showed microscopic hematuria but no infection.   PMH: Past Medical History:  Diagnosis Date   Arthritis    Colon polyps    Diabetes mellitus without complication (HCC)    Dysrhythmia    GERD (gastroesophageal reflux disease)    Heart murmur    with rheumatic fever as a child   History of kidney stones    Irregular heart rhythm    Kidney stone    Meniscus tear    left knee   Pneumonia    Pneumothorax    Pneumothorax    PONV (postoperative nausea and vomiting)    Rheumatic fever     Surgical History: Past Surgical History:  Procedure Laterality Date   APPENDECTOMY     COLONOSCOPY W/ POLYPECTOMY     COLONOSCOPY WITH ESOPHAGOGASTRODUODENOSCOPY (EGD)     KNEE ARTHROPLASTY Left 11/21/2022   Procedure: COMPUTER ASSISTED TOTAL KNEE ARTHROPLASTY;  Surgeon: Mardee Lynwood SQUIBB, MD;  Location: [facility];  Service: Orthopedics;  Laterality: Left;   KNEE ARTHROSCOPY WITH MEDIAL MENISECTOMY Left 09/24/2015   Procedure: KNEE ARTHROSCOPY WITH partial MEDIAL MENISECTOMY, chondroplasty;  Surgeon: Lynwood SQUIBB Mardee, MD;  Location: [facility];  Service: Orthopedics;  Laterality: Left;   LITHOTRIPSY      Family History: Family History  Problem Relation Age of Onset   Heart disease Mother    Thyroid  disease Mother    Diabetes Mother    Cancer Sister        lung   Diabetes Brother    Diabetes Maternal Grandmother     Social History:  reports that he quit smoking about 35 years ago. His smoking use included  cigarettes. He quit smokeless tobacco use about 31 years ago.  His smokeless tobacco use included chew. He reports that he does not drink alcohol and does not use drugs.  Physical Exam: BP 129/84   Pulse 60   Ht 5' 10 (1.778 m)   Wt 167 lb (75.8 kg)   SpO2 97%   BMI 23.96 kg/m    Constitutional:  Alert and oriented, No acute distress. Cardiovascular: No clubbing, cyanosis, or edema. Respiratory: Normal respiratory effort, no increased work of breathing. GI: Abdomen is soft, nontender, nondistended, no abdominal masses   Laboratory Data: Reviewed, see HPI  Pertinent Imaging: I have personally viewed and interpreted the CT scan showing a 1 cm left UPJ stone with mild hydronephrosis, 1200HU, 12cm SSD.  Assessment & Plan:   68 year old male with 1 cm left UPJ stone with mild hydronephrosis and gross hematuria, interestingly his pain is primarily right-sided.  I recommended considering ureteroscopy so cystoscopy could be performed at time of procedure to evaluate any other cause of hematuria and perform retrograde pyelogram on the right side in addition to left ureteroscopy and laser lithotripsy, however he prefers shockwave lithotripsy.  He reportedly has had ureteroscopy previously and did not tolerate the stent well.  He has done well with shockwave procedures previously.   We discussed various treatment options for urolithiasis including  observation with or without medical expulsive therapy, shockwave lithotripsy (SWL), ureteroscopy and laser lithotripsy with stent placement, and percutaneous nephrolithotomy.We discussed that management is based on stone size, location, density, patient co-morbidities, and patient preference. Stones <64mm in size have a >80% spontaneous passage rate. Data surrounding the use of tamsulosin for medical expulsive therapy is controversial, but meta analyses suggests it is most efficacious for distal stones between 5-62mm in size. Possible side effects include  dizziness/lightheadedness, and retrograde ejaculation.SWL has a lower stone free rate in a single procedure, but also a lower complication rate compared to ureteroscopy and avoids a stent and associated stent related symptoms. Possible complications include renal hematoma, steinstrasse, and need for additional treatment.Ureteroscopy with laser lithotripsy and stent placement has a higher stone free rate than SWL in a single procedure, however increased complication rate including possible infection, ureteral injury, bleeding, and stent related morbidity. Common stent related symptoms include dysuria, urgency/frequency, and flank pain.  Pain meds and Zofran  sent, return precautions discussed Schedule left shockwave lithotripsy Recommend repeating a urinalysis 6 weeks after shockwave lithotripsy to confirm resolution of microscopic hematuria, and consider cystoscopy if persistent micro heme or gross hematuria  Redell Burnet, MD 06/09/2024  [facility] [address], [address] [place], [place] [address] [phone]

## 2024-06-09 NOTE — Progress Notes (Unsigned)
 ESWL ORDER FORM  Expected date of procedure: Pt. Pref  Surgeon: MD on truck  Post op standing: 2-4wk follow up w/KUB prior  Anticoagulation/Aspirin/NSAID standing order: Hold all 72 hours prior  Anesthesia standing order: MAC  VTE standing: SCD's  Dx: Left Ureteral Stone  Procedure: left Extracorporeal shock wave lithotripsy  CPT : 50590  Standing Order Set:   *NPO after mn, KUB  *NS 158ml/hr, Keflex 500mg  PO, Benadryl  25mg  PO, Valium 10mg  PO, Zofran  4mg  IV  Medications if other than standing orders:   NONE

## 2024-06-09 NOTE — Patient Instructions (Signed)
 ESWL for Kidney Stones  Extracorporeal shock wave lithotripsy (ESWL) is a treatment that can help break up kidney stones that are too large to pass on their own.  This is a nonsurgical procedure that breaks up a kidney stone with shock waves. These shock waves pass through your body and focus on the kidney stone. They cause the kidney stone to break into smaller pieces (fragments) while it is still in the urinary tract. The fragments of stone can pass more easily out of your body in the pee (urine). Tell a health care provider about: Any allergies you have. All medicines you are taking, including vitamins, herbs, eye drops, creams, and over-the-counter medicines. Any problems you or family members have had with anesthetic medicines. Any bleeding problems you have. Any surgeries you've had. Any medical conditions you have. Whether you're pregnant or may be pregnant. What are the risks? Your health care provider will talk with you about risks. These may include: Infection. Bleeding from the kidney. Bruising of the kidney or skin. Scarring of the kidney. This can lead to: Increased blood pressure. Poor kidney function. Return (recurrence) of kidney stones. Damage to other structures or organs. This may include the liver, colon, spleen, or pancreas. Blockage (obstruction) of the tube that carries pee from the kidney to the bladder (ureter). Failure of the kidney stone to break into fragments. What happens before the procedure? When to stop eating and drinking Follow instructions from your health care provider about what you may eat and drink. These may include: 8 hours before your procedure Stop eating most foods. Do not eat meat, fried foods, or fatty foods. Eat only light foods, such as toast or crackers. All liquids are okay except energy drinks and alcohol. 6 hours before your procedure Stop eating. Drink only clear liquids, such as water, clear fruit juice, black coffee, plain tea,  and sports drinks. Do not drink energy drinks or alcohol. 2 hours before your procedure Stop drinking all liquids. You may be allowed to take medicines with small sips of water. If you do not follow your health care provider's instructions, your procedure may be delayed or canceled. Medicines Ask your health care provider about: Changing or stopping your regular medicines. These include any diabetes medicines or blood thinners you take. Taking medicines such as aspirin and ibuprofen. These medicines can thin your blood. Do not take them unless your health care provider tells you to. Taking over-the-counter medicines, vitamins, herbs, and supplements. Tests You may have tests, such as: Blood tests. Pee (urine) tests. Imaging tests. This may include a CT scan. Surgery safety Ask your health care provider: How your surgery site will be marked. What steps will be taken to help prevent infection. These steps may include: Washing skin with a soap that kills germs. Receiving antibiotics. General instructions If you will be going home right after the procedure, plan to have a responsible adult: Take you home from the hospital or clinic. You will not be allowed to drive. Care for you for the time you are told. What happens during the procedure?  An IV will be inserted into one of your veins. You may be given: A sedative. This helps you relax. Anesthesia. This will: Numb certain areas of your body. Make you fall asleep for surgery. A water-filled cushion may be placed behind your kidney or on your abdomen. In some cases, you may be placed in a tub of lukewarm water. Your body will be positioned in a way that makes it  easier to target the kidney stone. An X-ray or ultrasound exam will be done to locate your stone. Shock waves will be aimed at the stone. If you are awake, you may feel a tapping sensation as the shock waves pass through your body. A small mesh tube (stent) may be placed in  your ureter. This will help keep pee flowing from the kidney if the fragments of the stone have been blocking the ureter. The stent will be removed at a later time by your health care provider. The procedure may vary among health care providers and hospitals. What happens after the procedure? Your blood pressure, heart rate, breathing rate, and blood oxygen level will be monitored until you leave the hospital or clinic. You may have an X-ray after the procedure to see how many of the kidney stones were broken up. This will also show how much of the stone has passed. If there are still large fragments after treatment, you may need to have a second procedure at a later time. This information is not intended to replace advice given to you by your health care provider. Make sure you discuss any questions you have with your health care provider. Document Revised: 04/18/2023 Document Reviewed: 02/06/2022 Elsevier Patient Education  2024 [employer].

## 2024-06-09 NOTE — H&P (View-Only) (Signed)
 06/09/24 1:15 PM   [name] 16-Dec-1955 [id number]  CC: Left ureteral, flank pain, gross hematuria  HPI: 68 year old male who is a prior patient of Dr. Gala who reports at least 6 weeks of gross hematuria and flank pain, his pain is primarily on the right side.  CT yesterday with PCP showed a 1 cm left UPJ stone with mild hydronephrosis, small nonobstructive right-sided stones, no right hydronephrosis.  He denies any fevers or chills.  Urinalysis when he started having pain showed microscopic hematuria but no infection.   PMH: Past Medical History:  Diagnosis Date   Arthritis    Colon polyps    Diabetes mellitus without complication (HCC)    Dysrhythmia    GERD (gastroesophageal reflux disease)    Heart murmur    with rheumatic fever as a child   History of kidney stones    Irregular heart rhythm    Kidney stone    Meniscus tear    left knee   Pneumonia    Pneumothorax    Pneumothorax    PONV (postoperative nausea and vomiting)    Rheumatic fever     Surgical History: Past Surgical History:  Procedure Laterality Date   APPENDECTOMY     COLONOSCOPY W/ POLYPECTOMY     COLONOSCOPY WITH ESOPHAGOGASTRODUODENOSCOPY (EGD)     KNEE ARTHROPLASTY Left 11/21/2022   Procedure: COMPUTER ASSISTED TOTAL KNEE ARTHROPLASTY;  Surgeon: Mardee Lynwood SQUIBB, MD;  Location: [facility];  Service: Orthopedics;  Laterality: Left;   KNEE ARTHROSCOPY WITH MEDIAL MENISECTOMY Left 09/24/2015   Procedure: KNEE ARTHROSCOPY WITH partial MEDIAL MENISECTOMY, chondroplasty;  Surgeon: Lynwood SQUIBB Mardee, MD;  Location: [facility];  Service: Orthopedics;  Laterality: Left;   LITHOTRIPSY      Family History: Family History  Problem Relation Age of Onset   Heart disease Mother    Thyroid  disease Mother    Diabetes Mother    Cancer Sister        lung   Diabetes Brother    Diabetes Maternal Grandmother     Social History:  reports that he quit smoking about 35 years ago. His smoking use included  cigarettes. He quit smokeless tobacco use about 31 years ago.  His smokeless tobacco use included chew. He reports that he does not drink alcohol and does not use drugs.  Physical Exam: BP 129/84   Pulse 60   Ht 5' 10 (1.778 m)   Wt 167 lb (75.8 kg)   SpO2 97%   BMI 23.96 kg/m    Constitutional:  Alert and oriented, No acute distress. Cardiovascular: No clubbing, cyanosis, or edema. Respiratory: Normal respiratory effort, no increased work of breathing. GI: Abdomen is soft, nontender, nondistended, no abdominal masses   Laboratory Data: Reviewed, see HPI  Pertinent Imaging: I have personally viewed and interpreted the CT scan showing a 1 cm left UPJ stone with mild hydronephrosis, 1200HU, 12cm SSD.  Assessment & Plan:   68 year old male with 1 cm left UPJ stone with mild hydronephrosis and gross hematuria, interestingly his pain is primarily right-sided.  I recommended considering ureteroscopy so cystoscopy could be performed at time of procedure to evaluate any other cause of hematuria and perform retrograde pyelogram on the right side in addition to left ureteroscopy and laser lithotripsy, however he prefers shockwave lithotripsy.  He reportedly has had ureteroscopy previously and did not tolerate the stent well.  He has done well with shockwave procedures previously.   We discussed various treatment options for urolithiasis including  observation with or without medical expulsive therapy, shockwave lithotripsy (SWL), ureteroscopy and laser lithotripsy with stent placement, and percutaneous nephrolithotomy.We discussed that management is based on stone size, location, density, patient co-morbidities, and patient preference. Stones <64mm in size have a >80% spontaneous passage rate. Data surrounding the use of tamsulosin for medical expulsive therapy is controversial, but meta analyses suggests it is most efficacious for distal stones between 5-62mm in size. Possible side effects include  dizziness/lightheadedness, and retrograde ejaculation.SWL has a lower stone free rate in a single procedure, but also a lower complication rate compared to ureteroscopy and avoids a stent and associated stent related symptoms. Possible complications include renal hematoma, steinstrasse, and need for additional treatment.Ureteroscopy with laser lithotripsy and stent placement has a higher stone free rate than SWL in a single procedure, however increased complication rate including possible infection, ureteral injury, bleeding, and stent related morbidity. Common stent related symptoms include dysuria, urgency/frequency, and flank pain.  Pain meds and Zofran  sent, return precautions discussed Schedule left shockwave lithotripsy Recommend repeating a urinalysis 6 weeks after shockwave lithotripsy to confirm resolution of microscopic hematuria, and consider cystoscopy if persistent micro heme or gross hematuria  Redell Burnet, MD 06/09/2024  [facility] [address], [address] [place], [place] [address] [phone]

## 2024-06-10 NOTE — Progress Notes (Signed)
    [facility] ESWL POSTING SHEET        Patient Name: [name]  DOB: 1956/07/27  MRN: [id number]  Surgeon:  Glendia Barba, MD  Diagnosis:  Left Ureteral Stone  CPT: 49409  ESWL DATE: 06/23/2024  ESWL TIME: 0845  Special Needs/Requirements: No       Cardiac/Medical/Pulmonary Clearance needed: No  *Patient aware to hold Trulicity  for 1 week prior to surgery.        Form Faxed to [facility]- [phone] Date:   Date: 06/10/24       Form Faxed to [place]- [phone]  Date:  Date: 06/10/24           Copy Made for Insurance PA:  Date: 06/10/24       Orders Entered in to Epic:  Date: 06/10/24

## 2024-06-13 ENCOUNTER — Telehealth: Payer: Self-pay

## 2024-06-13 NOTE — Telephone Encounter (Signed)
 Called patient back to follow up on his after hours message. Pt stated he was able to get his Zofran . He had pt admit questions prior to surgery that I had to direct him to ask them as I wasn't quite sure of the answer.

## 2024-06-14 MED ORDER — BUTAMBEN-TETRACAINE-BENZOCAINE 2-2-14 % EX AERO
INHALATION_SPRAY | CUTANEOUS | Status: AC
  Filled 2024-06-14: qty 5

## 2024-06-14 MED ORDER — LIDOCAINE VISCOUS HCL 2 % MT SOLN
OROMUCOSAL | Status: AC
  Filled 2024-06-14: qty 15

## 2024-06-16 ENCOUNTER — Ambulatory Visit

## 2024-06-16 ENCOUNTER — Other Ambulatory Visit: Payer: Self-pay

## 2024-06-16 ENCOUNTER — Encounter
Admission: RE | Disposition: A | Discharge status: Home / Self Care | Payer: Self-pay | Source: Home / Self Care | Attending: Urology

## 2024-06-16 ENCOUNTER — Encounter: Payer: Self-pay | Admitting: Urology

## 2024-06-16 ENCOUNTER — Ambulatory Visit
Admission: RE | Admit: 2024-06-16 | Discharge: 2024-06-16 | Disposition: A | Discharge status: Home / Self Care | Attending: Urology | Admitting: Urology

## 2024-06-16 DIAGNOSIS — N132 Hydronephrosis with renal and ureteral calculous obstruction: Secondary | ICD-10-CM | POA: Diagnosis not present

## 2024-06-16 DIAGNOSIS — N200 Calculus of kidney: Secondary | ICD-10-CM

## 2024-06-16 DIAGNOSIS — N2889 Other specified disorders of kidney and ureter: Secondary | ICD-10-CM | POA: Diagnosis not present

## 2024-06-16 DIAGNOSIS — E119 Type 2 diabetes mellitus without complications: Secondary | ICD-10-CM | POA: Insufficient documentation

## 2024-06-16 DIAGNOSIS — N201 Calculus of ureter: Secondary | ICD-10-CM

## 2024-06-16 DIAGNOSIS — N202 Calculus of kidney with calculus of ureter: Secondary | ICD-10-CM | POA: Diagnosis not present

## 2024-06-16 DIAGNOSIS — Z01818 Encounter for other preprocedural examination: Secondary | ICD-10-CM | POA: Diagnosis not present

## 2024-06-16 HISTORY — PX: EXTRACORPOREAL SHOCK WAVE LITHOTRIPSY: SHX1557

## 2024-06-16 LAB — GLUCOSE, CAPILLARY: Glucose-Capillary: 137 mg/dL — ABNORMAL HIGH (ref 70–99)

## 2024-06-16 SURGERY — LITHOTRIPSY, ESWL
Anesthesia: Moderate Sedation | Laterality: Left

## 2024-06-16 MED ORDER — CEPHALEXIN 500 MG PO CAPS
500.0000 mg | ORAL_CAPSULE | Freq: Once | ORAL | Status: AC
  Administered 2024-06-16: 500 mg via ORAL

## 2024-06-16 MED ORDER — ONDANSETRON HCL 4 MG/2ML IJ SOLN
4.0000 mg | Freq: Once | INTRAMUSCULAR | Status: AC
  Administered 2024-06-16: 4 mg via INTRAVENOUS

## 2024-06-16 MED ORDER — DIAZEPAM 5 MG PO TABS
10.0000 mg | ORAL_TABLET | ORAL | Status: AC
  Administered 2024-06-16: 10 mg via ORAL

## 2024-06-16 MED ORDER — CEPHALEXIN 500 MG PO CAPS
ORAL_CAPSULE | ORAL | Status: AC
  Filled 2024-06-16: qty 1

## 2024-06-16 MED ORDER — SODIUM CHLORIDE 0.9 % IV SOLN: INTRAVENOUS | Status: DC

## 2024-06-16 MED ORDER — DIPHENHYDRAMINE HCL 25 MG PO CAPS
25.0000 mg | ORAL_CAPSULE | ORAL | Status: AC
  Administered 2024-06-16: 25 mg via ORAL

## 2024-06-16 MED ORDER — ONDANSETRON HCL 4 MG/2ML IJ SOLN
INTRAMUSCULAR | Status: AC
Start: 2024-06-16 — End: 2024-06-16
  Filled 2024-06-16: qty 2

## 2024-06-16 MED ORDER — TAMSULOSIN HCL 0.4 MG PO CAPS: 0.4000 mg | ORAL_CAPSULE | Freq: Every day | ORAL | 0 refills | Status: DC

## 2024-06-16 MED ORDER — DIPHENHYDRAMINE HCL 25 MG PO CAPS
ORAL_CAPSULE | ORAL | Status: AC
Start: 2024-06-16 — End: 2024-06-16
  Filled 2024-06-16: qty 1

## 2024-06-16 MED ORDER — DIAZEPAM 5 MG PO TABS
ORAL_TABLET | ORAL | Status: AC
  Filled 2024-06-16: qty 2

## 2024-06-16 NOTE — Interval H&P Note (Signed)
 History and Physical Interval Note:  06/16/2024 9:13 AM  [name]  has presented today for surgery, with the diagnosis of Left Ureteral Stone.  The various methods of treatment have been discussed with the patient and family. After consideration of risks, benefits and other options for treatment, the patient has consented to  Procedure(s): LITHOTRIPSY, ESWL (Left) as a surgical intervention.  The patient's history has been reviewed, patient examined, no change in status, stable for surgery.  I have reviewed the patient's chart and labs.  Questions were answered to the patient's satisfaction.    CV:RRR Lungs:clear   [name]

## 2024-06-16 NOTE — Discharge Instructions (Addendum)
 As per the [facility] discharge instructions Continue pain and nausea medication as needed A prescription for tamsulosin  which will help you pass stone fragments was also sent to your pharmacy Call [facility] at [phone] for pain not controlled with oral medications or fever greater than 101 degrees You will be contacted for follow-up appointment with x-ray in 1-2 weeks

## 2024-06-17 ENCOUNTER — Other Ambulatory Visit: Payer: Self-pay

## 2024-06-17 ENCOUNTER — Telehealth: Payer: Self-pay

## 2024-06-17 ENCOUNTER — Encounter: Payer: Self-pay | Admitting: Urology

## 2024-06-17 DIAGNOSIS — N201 Calculus of ureter: Secondary | ICD-10-CM

## 2024-06-17 NOTE — Telephone Encounter (Signed)
 Wife was concerned that they had not seen any stone debris yet from pt ESWL yesterday. He is in some pain but she states it much better than it was before. I let her know he might be passing small debris at the moment and that's the pain he could be having or he's passing such small stuff that they might not be able to see it. Wife asked about traveling. I let them know we will ask the Dr.

## 2024-06-26 ENCOUNTER — Other Ambulatory Visit: Payer: Self-pay | Admitting: Urology

## 2024-07-06 ENCOUNTER — Ambulatory Visit: Admitting: Physician Assistant

## 2024-07-06 ENCOUNTER — Ambulatory Visit
Admission: RE | Admit: 2024-07-06 | Discharge: 2024-07-06 | Disposition: A | Discharge status: Home / Self Care | Attending: Urology | Admitting: Urology

## 2024-07-06 ENCOUNTER — Ambulatory Visit
Admission: RE | Admit: 2024-07-06 | Discharge: 2024-07-06 | Disposition: A | Discharge status: Home / Self Care | Source: Ambulatory Visit | Attending: Urology | Admitting: Urology

## 2024-07-06 ENCOUNTER — Ambulatory Visit (INDEPENDENT_AMBULATORY_CARE_PROVIDER_SITE_OTHER): Admitting: Physician Assistant

## 2024-07-06 VITALS — BP 125/77 | HR 70 | Ht 70.0 in | Wt 167.5 lb

## 2024-07-06 DIAGNOSIS — N201 Calculus of ureter: Secondary | ICD-10-CM

## 2024-07-06 DIAGNOSIS — Z87442 Personal history of urinary calculi: Secondary | ICD-10-CM

## 2024-07-06 DIAGNOSIS — N200 Calculus of kidney: Secondary | ICD-10-CM

## 2024-07-06 DIAGNOSIS — R109 Unspecified abdominal pain: Secondary | ICD-10-CM

## 2024-07-06 LAB — URINALYSIS, COMPLETE
Bilirubin, UA: NEGATIVE
Ketones, UA: NEGATIVE
Leukocytes,UA: NEGATIVE
Nitrite, UA: NEGATIVE
Protein,UA: NEGATIVE
Specific Gravity, UA: 1.025 (ref 1.005–1.030)
Urobilinogen, Ur: 0.2 mg/dL (ref 0.2–1.0)
pH, UA: 6 (ref 5.0–7.5)

## 2024-07-06 LAB — MICROSCOPIC EXAMINATION

## 2024-07-06 MED ORDER — HYDROCODONE-ACETAMINOPHEN 5-325 MG PO TABS: 1.0000 | ORAL_TABLET | Freq: Four times a day (QID) | ORAL | 0 refills | Status: AC | PRN

## 2024-07-06 MED ORDER — TAMSULOSIN HCL 0.4 MG PO CAPS: 0.4000 mg | ORAL_CAPSULE | Freq: Every day | ORAL | 2 refills | Status: AC

## 2024-07-06 NOTE — Progress Notes (Signed)
 07/06/2024 9:31 AM   [name] 06/23/1956 [id number]  CC: Chief Complaint  Patient presents with   Follow-up   Nephrolithiasis   HPI: [name] is a 68 y.o. male with PMH nephrolithiasis and diabetes who underwent left ESWL with Dr. Twylla on 06/16/2024 for management of a 1 cm left lower pole stone who presents today for postop follow-up.  Operative note describes fragmentation of the stone.  He is accompanied today by his wife, who contributes to HPI.  Today he reports he never had left flank pain associated with his large left lower pole stone, which originally was imaged in the left UPJ.  He has had ongoing severe right flank pain, which has not resolved since ESWL.  He has passed multiple stone fragments, most recently 2 days ago.  He brings them with him for analysis today.  KUB today with interval resolution of the large left lower pole stone, stable right renal stones, and no radiopaque ureteral stones.  In-office UA today positive for 3+ glucose and 1+ blood; urine microscopy with 11-30 RBCs/HPF.  PMH: Past Medical History:  Diagnosis Date   Arthritis    Colon polyps    Diabetes mellitus without complication (HCC)    Dysrhythmia    GERD (gastroesophageal reflux disease)    Heart murmur    with rheumatic fever as a child   History of kidney stones    Irregular heart rhythm    Kidney stone    Meniscus tear    left knee   Pneumonia    Pneumothorax    Pneumothorax    PONV (postoperative nausea and vomiting)    Rheumatic fever     Surgical History: Past Surgical History:  Procedure Laterality Date   APPENDECTOMY     COLONOSCOPY W/ POLYPECTOMY     COLONOSCOPY WITH ESOPHAGOGASTRODUODENOSCOPY (EGD)     EXTRACORPOREAL SHOCK WAVE LITHOTRIPSY Left 06/16/2024   Procedure: LITHOTRIPSY, ESWL;  Surgeon: James Glendia BROCKS, MD;  Location: [facility];  Service: Urology;  Laterality: Left;   KNEE ARTHROPLASTY Left 11/21/2022   Procedure: COMPUTER ASSISTED TOTAL KNEE  ARTHROPLASTY;  Surgeon: [name] SQUIBB, MD;  Location: [facility];  Service: Orthopedics;  Laterality: Left;   KNEE ARTHROSCOPY WITH MEDIAL MENISECTOMY Left 09/24/2015   Procedure: KNEE ARTHROSCOPY WITH partial MEDIAL MENISECTOMY, chondroplasty;  Surgeon: [name] Mardee, MD;  Location: [facility];  Service: Orthopedics;  Laterality: Left;   LITHOTRIPSY      Home Medications:  Allergies as of 07/06/2024   No Known Allergies      Medication List        Accurate as of July 06, 2024  9:31 AM. If you have any questions, ask your nurse or doctor.          STOP taking these medications    donepezil  10 MG tablet Commonly known as: ARICEPT    enoxaparin  40 MG/0.4ML injection Commonly known as: LOVENOX    Melatonin 10 MG Tabs   omeprazole  40 MG capsule Commonly known as: PRILOSEC   oxyCODONE  5 MG immediate release tablet Commonly known as: Oxy IR/ROXICODONE    simethicone 125 MG chewable tablet Commonly known as: MYLICON       TAKE these medications    Accu-Chek Aviva Plus w/Device Kit Use kit to check sugar daily   acetaminophen  500 MG tablet Commonly known as: TYLENOL  Take 2 tablets (1,000 mg total) by mouth every 6 (six) hours as needed.   albuterol  108 (90 Base) MCG/ACT inhaler Commonly known as: VENTOLIN   HFA Inhale 2 puffs into the lungs every 6 (six) hours as needed for wheezing or shortness of breath.   atorvastatin  40 MG tablet Commonly known as: LIPITOR Take 1 tablet (40 mg total) by mouth daily.   benzonatate  200 MG capsule Commonly known as: TESSALON  Take 1 capsule (200 mg total) by mouth 2 (two) times daily as needed for cough.   celecoxib  200 MG capsule Commonly known as: CELEBREX  Take 1 capsule (200 mg total) by mouth 2 (two) times daily.   clonazePAM  1 MG tablet Commonly known as: KLONOPIN  Take 1 tablet (1 mg total) by mouth daily as needed for anxiety.   FreeStyle Libre 2 Reader [employer] Use to monitor blood glucose.    HYDROcodone -acetaminophen  5-325 MG tablet Commonly known as: NORCO/VICODIN Take 1 tablet by mouth every 6 (six) hours as needed for severe pain (pain score 7-10).   Jardiance  25 MG Tabs tablet Generic drug: empagliflozin  TAKE ONE TABLET BY MOUTH EVERY MORNING   metFORMIN  500 MG tablet Commonly known as: GLUCOPHAGE  TAKE TWO TABLETS BY MOUTH AT BREAKFAST AND AT BEDTIME What changed: See the new instructions.   MULTIVITAMIN ADULT PO Take by mouth.   ondansetron  4 MG tablet Commonly known as: ZOFRAN  Take 1 tablet (4 mg total) by mouth every 6 (six) hours as needed for nausea.   ondansetron  4 MG tablet Commonly known as: Zofran  Take 1 tablet (4 mg total) by mouth every 8 (eight) hours as needed for nausea or vomiting.   ONE TOUCH ULTRA TEST test strip Generic drug: glucose blood USE TO CHECK BLOOD SUGAR LEVELS THREE TIMES DAILY. DX:E11.9   sucralfate  1 g tablet Commonly known as: Carafate  Take 1 tablet (1 g total) by mouth 2 (two) times daily.   tamsulosin  0.4 MG Caps capsule Commonly known as: FLOMAX  Take 1 capsule (0.4 mg total) by mouth daily after breakfast.   Trulicity  0.75 MG/0.5ML Soaj Generic drug: Dulaglutide  Inject once a week What changed:  how much to take how to take this when to take this additional instructions   VITAMIN D-3 PO Take 5,000 Units/day by mouth daily.   vitamin E 180 MG (400 UNITS) capsule Take 400 Units by mouth daily.   Zinc 50 MG Tabs Take 50 mg by mouth daily.        Allergies:  No Known Allergies  Family History: Family History  Problem Relation Age of Onset   Heart disease Mother    Thyroid  disease Mother    Diabetes Mother    Cancer Sister        lung   Diabetes Brother    Diabetes Maternal Grandmother     Social History:   reports that he quit smoking about 35 years ago. His smoking use included cigarettes. He quit smokeless tobacco use about 31 years ago.  His smokeless tobacco use included chew. He reports  that he does not drink alcohol and does not use drugs.  Physical Exam: BP 125/77 (BP Location: Left Arm, Patient Position: Sitting, Cuff Size: Normal)   Pulse 70   Ht 5' 10 (1.778 m)   Wt 167 lb 8 oz (76 kg)   SpO2 97%   BMI 24.03 kg/m   Constitutional:  Alert and oriented, no acute distress, nontoxic appearing HEENT: [employer], AT Cardiovascular: No clubbing, cyanosis, or edema Respiratory: Normal respiratory effort, no increased work of breathing Skin: No rashes, bruises or suspicious lesions Neurologic: Grossly intact, no focal deficits, moving all 4 extremities Psychiatric: Normal mood and affect  Laboratory  Data: See epic  Pertinent Imaging: KUB, 07/06/2024: See epic  I personally reviewed the images referenced above and note stable right renal stones, interval resolution of the large left lower pole stone, and no radiopaque ureteral stones.  Assessment & Plan:   1. Left renal stone (Primary) Interval resolution of the stone on KUB today.  Will send fragments for analysis and reach out via MyChart with stone prevention recommendations. - Urinalysis, Complete - Stone Analysis  2. Flank pain with history of urolithiasis Ongoing right flank pain.  Right renal stones appear stable on KUB, however there were other right renal stones present on recent CT that are not well-visualized on KUB.  We discussed the differential including referred pain from a left sided fragment versus acute stone episode on the right.  Microscopic hematuria today is nonspecific and could indicate recent passage of stone fragments versus ongoing stone episode on either side.  At this point, I recommended repeat imaging to reassess his stone burden and he agreed.  Will keep him on empiric Flomax  and refill pain medication in the interim. - CULTURE, URINE COMPREHENSIVE - CT RENAL STONE STUDY; Future - HYDROcodone -acetaminophen  (NORCO/VICODIN) 5-325 MG tablet; Take 1 tablet by mouth every 6 (six) hours as needed  for severe pain (pain score 7-10).  Dispense: 8 tablet; Refill: 0 - tamsulosin  (FLOMAX ) 0.4 MG CAPS capsule; Take 1 capsule (0.4 mg total) by mouth daily after breakfast.  Dispense: 30 capsule; Refill: 2  Return for Will call with results, Will share stone analysis results via MyChart.  Lucie Hones, PA-C  [facility] [place] [address], [address] [place], [place] [address] [phone]

## 2024-07-07 ENCOUNTER — Ambulatory Visit
Admission: RE | Admit: 2024-07-07 | Discharge: 2024-07-07 | Disposition: A | Discharge status: Home / Self Care | Source: Ambulatory Visit | Attending: Physician Assistant | Admitting: Physician Assistant

## 2024-07-07 DIAGNOSIS — R109 Unspecified abdominal pain: Secondary | ICD-10-CM | POA: Diagnosis not present

## 2024-07-07 DIAGNOSIS — N400 Enlarged prostate without lower urinary tract symptoms: Secondary | ICD-10-CM | POA: Diagnosis not present

## 2024-07-07 DIAGNOSIS — N200 Calculus of kidney: Secondary | ICD-10-CM | POA: Diagnosis not present

## 2024-07-07 DIAGNOSIS — Z87442 Personal history of urinary calculi: Secondary | ICD-10-CM | POA: Diagnosis not present

## 2024-07-08 ENCOUNTER — Ambulatory Visit: Payer: Self-pay | Admitting: Physician Assistant

## 2024-07-11 ENCOUNTER — Encounter: Payer: Self-pay | Admitting: Urology

## 2024-07-12 LAB — CULTURE, URINE COMPREHENSIVE

## 2024-07-14 LAB — STONE ANALYSIS
Calcium Oxalate Monohydrate: 100 %
Weight Calculi: 62 mg

## 2024-07-20 ENCOUNTER — Telehealth: Payer: Self-pay | Admitting: Physician Assistant

## 2024-07-20 NOTE — Telephone Encounter (Signed)
 Patient called today to confim his lab appt time on 10/7, and he asked about results for his stones that he dropped off. There is a Clinical cytogeneticist message that [name] sent to him, but he does not use his mychart anymore, and has not seen it. Can he be called to relay the information in the message.

## 2024-07-21 NOTE — Telephone Encounter (Signed)
 Phone call attempted. Voicemail left for patient to return call to clinic.

## 2024-07-25 NOTE — Telephone Encounter (Signed)
 Spoke with Leeroy Bathe (Wife) and verbally went over previous MyChart message from Sam. No further concerns discussed.  Also reaching out to front desk to have them send a MyChart link so they can regain access. Leeroy voiced understanding. Provided clini telephone number for any additional questions or concerns.

## 2024-07-26 ENCOUNTER — Other Ambulatory Visit: Payer: Self-pay

## 2024-07-26 ENCOUNTER — Other Ambulatory Visit

## 2024-07-26 DIAGNOSIS — R109 Unspecified abdominal pain: Secondary | ICD-10-CM | POA: Diagnosis not present

## 2024-07-26 DIAGNOSIS — N201 Calculus of ureter: Secondary | ICD-10-CM

## 2024-07-26 DIAGNOSIS — N200 Calculus of kidney: Secondary | ICD-10-CM

## 2024-07-26 DIAGNOSIS — R310 Gross hematuria: Secondary | ICD-10-CM

## 2024-07-26 DIAGNOSIS — Z87442 Personal history of urinary calculi: Secondary | ICD-10-CM | POA: Diagnosis not present

## 2024-07-26 LAB — MICROSCOPIC EXAMINATION

## 2024-07-26 LAB — URINALYSIS, COMPLETE
Bilirubin, UA: NEGATIVE
Ketones, UA: NEGATIVE
Leukocytes,UA: NEGATIVE
Nitrite, UA: NEGATIVE
Protein,UA: NEGATIVE
Specific Gravity, UA: 1.01 (ref 1.005–1.030)
Urobilinogen, Ur: 0.2 mg/dL (ref 0.2–1.0)
pH, UA: 6 (ref 5.0–7.5)

## 2024-07-27 ENCOUNTER — Ambulatory Visit: Payer: Self-pay | Admitting: Physician Assistant

## 2024-08-22 ENCOUNTER — Other Ambulatory Visit

## 2024-09-05 DIAGNOSIS — E118 Type 2 diabetes mellitus with unspecified complications: Secondary | ICD-10-CM | POA: Diagnosis not present

## 2024-09-05 DIAGNOSIS — J40 Bronchitis, not specified as acute or chronic: Secondary | ICD-10-CM | POA: Diagnosis not present

## 2024-09-12 ENCOUNTER — Encounter: Payer: Self-pay | Admitting: Physician Assistant

## 2024-09-19 DIAGNOSIS — E118 Type 2 diabetes mellitus with unspecified complications: Secondary | ICD-10-CM | POA: Diagnosis not present
